# Patient Record
Sex: Female | Born: 1935 | Race: White | Hispanic: No | Marital: Single
Health system: Southern US, Community
[De-identification: ages and names within clinical notes are randomized; demographics above are authoritative.]

## PROBLEM LIST (undated history)

## (undated) DIAGNOSIS — E039 Hypothyroidism, unspecified: Secondary | ICD-10-CM

## (undated) DIAGNOSIS — M810 Age-related osteoporosis without current pathological fracture: Secondary | ICD-10-CM

## (undated) DIAGNOSIS — F0390 Unspecified dementia without behavioral disturbance: Secondary | ICD-10-CM

## (undated) DIAGNOSIS — I7090 Unspecified atherosclerosis: Secondary | ICD-10-CM

## (undated) DIAGNOSIS — I10 Essential (primary) hypertension: Secondary | ICD-10-CM

## (undated) DIAGNOSIS — K5900 Constipation, unspecified: Secondary | ICD-10-CM

## (undated) DIAGNOSIS — J449 Chronic obstructive pulmonary disease, unspecified: Secondary | ICD-10-CM

## (undated) DIAGNOSIS — E785 Hyperlipidemia, unspecified: Secondary | ICD-10-CM

## (undated) DIAGNOSIS — R32 Unspecified urinary incontinence: Secondary | ICD-10-CM

## (undated) DIAGNOSIS — L309 Dermatitis, unspecified: Secondary | ICD-10-CM

## (undated) DIAGNOSIS — N8110 Cystocele, unspecified: Secondary | ICD-10-CM

## (undated) DIAGNOSIS — E079 Disorder of thyroid, unspecified: Secondary | ICD-10-CM

## (undated) HISTORY — PX: NO PAST SURGERIES: SHX2092

## (undated) HISTORY — PX: HYSTERECTOMY: SHX81

## (undated) HISTORY — PX: JOINT REPLACEMENT: SHX530

## (undated) HISTORY — PX: EYE SURGERY: SHX253

## (undated) HISTORY — PX: KNEE SURGERY: SHX244

---

## 2016-11-21 ENCOUNTER — Observation Stay
Admission: EM | Admit: 2016-11-21 | Discharge: 2016-11-22 | Disposition: A | Discharge status: Home / Self Care | Payer: Medicare Other | Attending: Hospitalist | Admitting: Hospitalist

## 2016-11-21 ENCOUNTER — Emergency Department: Payer: Medicare Other

## 2016-11-21 DIAGNOSIS — I10 Essential (primary) hypertension: Secondary | ICD-10-CM | POA: Insufficient documentation

## 2016-11-21 DIAGNOSIS — R112 Nausea with vomiting, unspecified: Secondary | ICD-10-CM | POA: Insufficient documentation

## 2016-11-21 DIAGNOSIS — Z87898 Personal history of other specified conditions: Secondary | ICD-10-CM

## 2016-11-21 DIAGNOSIS — R42 Dizziness and giddiness: Secondary | ICD-10-CM | POA: Diagnosis present

## 2016-11-21 DIAGNOSIS — R609 Edema, unspecified: Secondary | ICD-10-CM | POA: Insufficient documentation

## 2016-11-21 DIAGNOSIS — R270 Ataxia, unspecified: Secondary | ICD-10-CM | POA: Insufficient documentation

## 2016-11-21 DIAGNOSIS — D72829 Elevated white blood cell count, unspecified: Secondary | ICD-10-CM | POA: Insufficient documentation

## 2016-11-21 DIAGNOSIS — Z66 Do not resuscitate: Secondary | ICD-10-CM | POA: Insufficient documentation

## 2016-11-21 DIAGNOSIS — R05 Cough: Secondary | ICD-10-CM | POA: Insufficient documentation

## 2016-11-21 DIAGNOSIS — Z79899 Other long term (current) drug therapy: Secondary | ICD-10-CM | POA: Insufficient documentation

## 2016-11-21 DIAGNOSIS — E039 Hypothyroidism, unspecified: Secondary | ICD-10-CM | POA: Insufficient documentation

## 2016-11-21 DIAGNOSIS — R062 Wheezing: Secondary | ICD-10-CM | POA: Insufficient documentation

## 2016-11-21 DIAGNOSIS — Z9181 History of falling: Secondary | ICD-10-CM | POA: Insufficient documentation

## 2016-11-21 DIAGNOSIS — E785 Hyperlipidemia, unspecified: Secondary | ICD-10-CM | POA: Insufficient documentation

## 2016-11-21 DIAGNOSIS — I6782 Cerebral ischemia: Secondary | ICD-10-CM | POA: Insufficient documentation

## 2016-11-21 DIAGNOSIS — H8112 Benign paroxysmal vertigo, left ear: Principal | ICD-10-CM | POA: Insufficient documentation

## 2016-11-21 HISTORY — DX: Cystocele, unspecified: N81.10

## 2016-11-21 HISTORY — DX: Hyperlipidemia, unspecified: E78.5

## 2016-11-21 HISTORY — DX: Essential (primary) hypertension: I10

## 2016-11-21 HISTORY — DX: Disorder of thyroid, unspecified: E07.9

## 2016-11-21 LAB — CBC AND DIFFERENTIAL
Absolute NRBC: 0 10*3/uL
Basophils Absolute Automated: 0.03 10*3/uL (ref 0.00–0.20)
Basophils Automated: 0.2 %
Eosinophils Absolute Automated: 0.08 10*3/uL (ref 0.00–0.70)
Eosinophils Automated: 0.5 %
Hematocrit: 40.4 % (ref 37.0–47.0)
Hgb: 13.5 g/dL (ref 12.0–16.0)
Immature Granulocytes Absolute: 0.07 10*3/uL — ABNORMAL HIGH
Immature Granulocytes: 0.5 %
Lymphocytes Absolute Automated: 1.33 10*3/uL (ref 0.50–4.40)
Lymphocytes Automated: 8.9 %
MCH: 32.1 pg — ABNORMAL HIGH (ref 28.0–32.0)
MCHC: 33.4 g/dL (ref 32.0–36.0)
MCV: 96 fL (ref 80.0–100.0)
MPV: 9.5 fL (ref 9.4–12.3)
Monocytes Absolute Automated: 0.65 10*3/uL (ref 0.00–1.20)
Monocytes: 4.3 %
Neutrophils Absolute: 12.82 10*3/uL — ABNORMAL HIGH (ref 1.80–8.10)
Neutrophils: 85.6 %
Nucleated RBC: 0 /100 WBC (ref 0.0–1.0)
Platelets: 269 10*3/uL (ref 140–400)
RBC: 4.21 10*6/uL (ref 4.20–5.40)
RDW: 13 % (ref 12–15)
WBC: 14.98 10*3/uL — ABNORMAL HIGH (ref 3.50–10.80)

## 2016-11-21 LAB — URINALYSIS
Bilirubin, UA: NEGATIVE
Blood, UA: NEGATIVE
Glucose, UA: NEGATIVE
Leukocyte Esterase, UA: NEGATIVE
Nitrite, UA: NEGATIVE
Protein, UR: NEGATIVE
Specific Gravity UA: 1.02 (ref 1.001–1.035)
Urine pH: 7 (ref 5.0–8.0)
Urobilinogen, UA: 0.2 mg/dL

## 2016-11-21 LAB — BASIC METABOLIC PANEL
Anion Gap: 11 (ref 5.0–15.0)
BUN: 21.8 mg/dL — ABNORMAL HIGH (ref 7.0–19.0)
CO2: 22 mEq/L (ref 22–29)
Calcium: 10.2 mg/dL (ref 7.9–10.2)
Chloride: 106 mEq/L (ref 100–111)
Creatinine: 0.8 mg/dL (ref 0.6–1.0)
Glucose: 140 mg/dL — ABNORMAL HIGH (ref 70–100)
Potassium: 4 mEq/L (ref 3.5–5.1)
Sodium: 139 mEq/L (ref 136–145)

## 2016-11-21 LAB — I-STAT TROPONIN: i-STAT Troponin: 0 ng/mL (ref 0.00–0.09)

## 2016-11-21 LAB — GFR: EGFR: >60

## 2016-11-21 MED ORDER — MECLIZINE HCL 25 MG PO TABS
25.0000 mg | ORAL_TABLET | Freq: Two times a day (BID) | ORAL | 0 refills | Status: DC | PRN
Start: 2016-11-21 — End: 2016-11-22

## 2016-11-21 MED ORDER — MECLIZINE HCL 12.5 MG PO TABS
12.5000 mg | ORAL_TABLET | Freq: Once | ORAL | Status: AC
Start: 2016-11-21 — End: 2016-11-21
  Administered 2016-11-21: 14:00:00 12.5 mg via ORAL
  Filled 2016-11-21: qty 1

## 2016-11-21 MED ORDER — SODIUM CHLORIDE 0.9 % IV BOLUS
500.0000 mL | Freq: Once | INTRAVENOUS | Status: AC
Start: 2016-11-21 — End: 2016-11-21
  Administered 2016-11-21: 14:00:00 500 mL via INTRAVENOUS

## 2016-11-21 MED ORDER — ONDANSETRON HCL 4 MG/2ML IJ SOLN
4.0000 mg | Freq: Once | INTRAMUSCULAR | Status: AC
Start: 2016-11-21 — End: 2016-11-21
  Administered 2016-11-21: 14:00:00 4 mg via INTRAVENOUS
  Filled 2016-11-21: qty 2

## 2016-11-21 NOTE — ED Provider Notes (Signed)
EMERGENCY DEPARTMENT HISTORY AND PHYSICAL EXAM    Date Time: 11/21/2016 1:50 PM   Patient Name: Melissa Dunn  Attending MD: Rachell Cipro  Time of Initial Evaluation:: 1:50 PM       Diagnosis and Treatment Plan     Disposition:     Observation Admit      ED Disposition     ED Disposition Condition Date/Time Comment    Observation  Sat Nov 21, 2016  4:19 PM Admitting Physician: Andrew Au [[id number]   Diagnosis: History of vertigo [682780]   Estimated Length of Stay: < 2 midnights   Tentative Discharge Plan?: Home or Self Care [1]   Patient Class: Observation [104]            Diagnosis:  1. History of vertigo    2. Non-intractable vomiting with nausea, unspecified vomiting type    3. Benign paroxysmal positional vertigo of left ear         New Discharge Prescriptions:  New Prescriptions    MECLIZINE (ANTIVERT) 25 MG TABLET    Take 1 tablet (25 mg total) by mouth 2 (two) times daily as needed for Dizziness.for up to 10 doses        History of Presenting Illness     Chief Complaint:   Chief Complaint   Patient presents with   . Dizziness   . Emesis       Melissa Dunn is a 83 y.o. female with MHx of HTN who presents with two days history of dizziness which is described as "spinning around". Movement aggravates dizziness. A/w fatigue, nausea and two episodes of emesis today. Family states that they were at [employer], patient felt dizzy and threw up. No fall/injury. Family also reports of patient wheezing. Denies fever/ chills. Patient has Advair prescribed by allergist. Patient has history of questionable stroke in the past per family. Has history of vertigo in the past. Visiting from [place].    This history was obtained from the(a) Patient and Family.      Past Medical History     Past Medical History:   Diagnosis Date   . Female bladder prolapse    . Hypertension        Past Surgical History     Past Surgical History:   Procedure Laterality Date   . EYE SURGERY     . HYSTERECTOMY     . KNEE SURGERY          Family History     History reviewed. No pertinent family history.    Social History     Social History     Social History   . Marital status: Unknown     Spouse name: N/A   . Number of children: N/A   . Years of education: N/A     Social History Main Topics   . Smoking status: Never Smoker   . Smokeless tobacco: Never Used   . Alcohol use No   . Drug use: Unknown   . Sexual activity: Not on file     Other Topics Concern   . Not on file     Social History Narrative   . No narrative on file       Allergies     No Known Allergies    Medications     No current facility-administered medications for this encounter.     Current Outpatient Prescriptions:   .  albuterol (PROVENTIL HFA;VENTOLIN HFA) 108 (90 Base) MCG/ACT inhaler, Inhale 2 puffs into the lungs.,  Disp: , Rfl:   .  alendronate (FOSAMAX) 70 MG tablet, Take 70 mg by mouth every 7 days.Take 1 tab by mouth every 7 days with a full glass of water on an empty stomach. Do not take anything else by mouth or lie down for 30 mins., Disp: , Rfl:   .  cetirizine (ZYRTEC) 10 MG tablet, Take 10 mg by mouth daily., Disp: , Rfl:   .  fluticasone-salmeterol (ADVAIR HFA) 115-21 MCG/ACT inhaler, Inhale 2 puffs into the lungs 2 (two) times daily., Disp: , Rfl:   .  levothyroxine (SYNTHROID, LEVOTHROID) 88 MCG tablet, Take 88 mcg by mouth Once a day at 6:00am., Disp: , Rfl:   .  lisinopril (PRINIVIL,ZESTRIL) 10 MG tablet, Take 10 mg by mouth daily., Disp: , Rfl:   .  potassium chloride (KLOR-CON) 20 MEQ packet, Take 20 mEq by mouth., Disp: , Rfl:   .  predniSONE (DELTASONE) 5 MG tablet, Take 5 mg by mouth daily., Disp: , Rfl:   .  simvastatin (ZOCOR) 10 MG tablet, Take 10 mg by mouth nightly., Disp: , Rfl:   .  meclizine (ANTIVERT) 25 MG tablet, Take 1 tablet (25 mg total) by mouth 2 (two) times daily as needed for Dizziness.for up to 10 doses, Disp: 10 tablet, Rfl: 0    Review of Systems     Positive: dizziness, nausea, emesis, fatigue and wheezing.    Negative: fever/  chills.    All Other Systems Reviewed and Negative: Yes.    Physical Exam     BP 179/79   Pulse 72   Temp 98 F (36.7 C)   Resp 18   Ht 5\' 3"  (1.6 m)   Wt 80.3 kg   SpO2 96%   BMI 31.35 kg/m     Constitutional: Vital signs reviewed. Well appearing. Alert. NAD. Oriented x2.  Head: Normocephalic, atraumatic  Eyes: No conjunctival injection. No discharge. PERRL. EOMI. Positive lens implants bilaterally.   ENT: Mucous membranes moist. Bl TM unremarkable.   Neck: Normal range of motion. Trachea midline.  Respiratory/Chest: Few crackles R base otherwise unremarkable.   Cardiovascular: Regular rate and rhythm. No murmur.   Abdomen: Soft and non-tender. No guarding. No masses or hepatosplenomegaly.  Extremity: Grossly moving all extremities.   Neurological: No focal motor deficits by observation. Speech normal. Memory normal. SILTx4. Four extremities 5/5 strength. Finger to nose intact bilaterally.   Skin: Warm and dry. No rash.  Psychiatric: Normal affect. Normal concentration.    Diagnostic Study Results     Labs -     Results     Procedure Component Value Units Date/Time    UA with reflex to Micro [[id number]]  (Abnormal) Collected:  11/21/16 1513    Specimen:  Urine Updated:  11/21/16 1519     Urine Type Clean Catch     Color, UA Yellow     Clarity, UA Clear     Specific Gravity UA 1.020     Urine pH 7.0     Leukocyte Esterase, UA Negative     Nitrite, UA Negative     Protein, UR Negative     Glucose, UA Negative     Ketones UA Trace (A)     Urobilinogen, UA 0.2 mg/dL      Bilirubin, UA Negative     Blood, UA Negative    i-Stat Troponin [[id number]] Collected:  11/21/16 1345     Updated:  11/21/16 1422     i-STAT Troponin 0.00 ng/mL  Basic Metabolic Panel (BMP) [[id number]]  (Abnormal) Collected:  11/21/16 1345    Specimen:  Blood Updated:  11/21/16 1405     Glucose 140 (H) mg/dL      BUN 69.6 (H) mg/dL      Creatinine 0.8 mg/dL      Calcium 29.5 mg/dL      Sodium 284 mEq/L      Potassium 4.0 mEq/L       Chloride 106 mEq/L      CO2 22 mEq/L      Anion Gap 11.0    GFR [[id number]] Collected:  11/21/16 1345     Updated:  11/21/16 1405     EGFR >60.0    CBC with Differential [[id number]]  (Abnormal) Collected:  11/21/16 1345    Specimen:  Blood from Blood Updated:  11/21/16 1348     WBC 14.98 (H) x10 3/uL      Hgb 13.5 g/dL      Hematocrit 34.7 %      Platelets 269 x10 3/uL      RBC 4.21 x10 6/uL      MCV 96.0 fL      MCH 32.1 (H) pg      MCHC 33.4 g/dL      RDW 13 %      MPV 9.5 fL      Neutrophils 85.6 %      Lymphocytes Automated 8.9 %      Monocytes 4.3 %      Eosinophils Automated 0.5 %      Basophils Automated 0.2 %      Immature Granulocyte 0.5 %      Nucleated RBC 0.0 /100 WBC      Neutrophils Absolute 12.82 (H) x10 3/uL      Abs Lymph Automated 1.33 x10 3/uL      Abs Mono Automated 0.65 x10 3/uL      Abs Eos Automated 0.08 x10 3/uL      Absolute Baso Automated 0.03 x10 3/uL      Absolute Immature Granulocyte 0.07 (H) x10 3/uL      Absolute NRBC 0.00 x10 3/uL           Radiologic Studies -   Radiology Results (24 Hour)     Procedure Component Value Units Date/Time    CT Head without Contrast [[id number]] Collected:  11/21/16 1523    Order Status:  Completed Updated:  11/21/16 1528    Narrative:       HISTORY: Vertigo and dizziness    Technique: Noncontrast CT of the head. The following dose reduction  techniques were utilized: automatic exposure control and/or adjustment  of mA and/or kV according to patient size, and the use of iterative  reconstruction technique.    Comparison: None.    Findings: There is no evidence of acute territorial infarction or  intracranial hemorrhage. The ventricles and sulci are normal in size and  configuration. There is no mass effect or midline shift. No extra-axial  collections are seen. The globes and orbits appear unremarkable. Mild  mucosal thickening is seen within the paranasal sinuses.      Impression:        No acute intracranial abnormality.    Georgann Housekeeper, MD    11/21/2016 3:24 PM    XR Chest 2 Views [[id number]] Collected:  11/21/16 1442    Order Status:  Completed Updated:  11/21/16 1448    Narrative:       HISTORY: Dizziness and cough  COMPARISON: None    FINDINGS: Heart size is normal. No focal consolidation, pleural effusion  or pneumothorax. Haziness at the left lung base likely represents  epicardial fat.           Impression:          No acute intrathoracic process.     Shelly Flatten, MD   11/21/2016 2:44 PM      .    Clinical Course in the Emergency Department     3:35 PM Offered admission however patient refused adamantly.     3:50-4:08 PM Discussed in length for options of d/c vs. hospitalization, cns service. Patient states that she is still symptomatic. Appears somewhat pale. Negative chest x-ray and CT of head. Neurologically intact. Patient did not want hospitalization but daughter thinks further work up is needed.     4:08 PM CNS hospitalist paged.     4:17 PM Spoke with Dr. Wilburt Finlay; will admit patient, neuro tele obs.       Medical Decision Making     I reviewed the vital signs, nursing notes, past medical history, past surgical history, family history and social history.    Vital Signs -   Patient Vitals for the past 12 hrs:   BP Temp Pulse Resp   11/21/16 1551 179/79 - 72 18   11/21/16 1449 - - 69 18   11/21/16 1320 179/87 98 F (36.7 C) 95 20       Pulse Oximetry Analysis - nl without need for supplemental oxygen        Laboratory results reviewed by EDP: yes in real time    EKG: interpreted by me - NSR at 84. Nl intervals. Poor R wave progression anterior leads. ST flattening in inferior leads.     Impression/ Differential Diagnosis (not completely inclusive): 83 y.o. WF presenting with acute vertigo and N/V and otherwise intact neuro exam R/O CVA, BPPV, infection  Plan:  IVFs, antivert and CT imaging  Admit CNS hospitalist for further W/U            _______________________________    Attestations:    I was acting as a scribe for Ashantee Deupree, Mcarthur Rossetti,  MD on Teodoro Spray     I am the first provider for this patient and I personally performed the services documented. Melissa Dunn is scribing for me on Mallicoat,Kadee. This note and the patient instructions accurately reflect work and decisions made by me.  Melissa Guardian, MD          _______________________________             Melissa Guardian, MD  11/25/16 [phone]

## 2016-11-22 ENCOUNTER — Observation Stay: Payer: Medicare Other

## 2016-11-22 DIAGNOSIS — R42 Dizziness and giddiness: Secondary | ICD-10-CM

## 2016-11-22 DIAGNOSIS — R270 Ataxia, unspecified: Secondary | ICD-10-CM

## 2016-11-22 DIAGNOSIS — R112 Nausea with vomiting, unspecified: Secondary | ICD-10-CM

## 2016-11-22 DIAGNOSIS — Z87898 Personal history of other specified conditions: Secondary | ICD-10-CM

## 2016-11-22 LAB — T4, FREE: T4 Free: 1.15 ng/dL (ref 0.70–1.48)

## 2016-11-22 LAB — CBC
Absolute NRBC: 0 10*3/uL
Hematocrit: 38.5 % (ref 37.0–47.0)
Hgb: 12.8 g/dL (ref 12.0–16.0)
MCH: 31.5 pg (ref 28.0–32.0)
MCHC: 33.2 g/dL (ref 32.0–36.0)
MCV: 94.8 fL (ref 80.0–100.0)
MPV: 9.8 fL (ref 9.4–12.3)
Nucleated RBC: 0 /100 WBC (ref 0.0–1.0)
Platelets: 247 10*3/uL (ref 140–400)
RBC: 4.06 10*6/uL — ABNORMAL LOW (ref 4.20–5.40)
RDW: 13 % (ref 12–15)
WBC: 10.64 10*3/uL (ref 3.50–10.80)

## 2016-11-22 LAB — PT/INR
PT INR: 1 (ref 0.9–1.1)
PT: 13.4 s (ref 12.6–15.0)

## 2016-11-22 LAB — ECG 12-LEAD
Atrial Rate: 84 {beats}/min
P Axis: 43 degrees
P-R Interval: 152 ms
Q-T Interval: 388 ms
QRS Duration: 72 ms
QTC Calculation (Bezet): 458 ms
R Axis: 27 degrees
T Axis: 50 degrees
Ventricular Rate: 84 {beats}/min

## 2016-11-22 LAB — TSH: TSH: 0.25 u[IU]/mL — ABNORMAL LOW (ref 0.35–4.94)

## 2016-11-22 MED ORDER — LORAZEPAM 2 MG/ML IJ SOLN
1.0000 mg | Freq: Once | INTRAMUSCULAR | Status: AC | PRN
Start: 2016-11-22 — End: 2016-11-22
  Administered 2016-11-22: 04:00:00 1 mg via INTRAVENOUS
  Filled 2016-11-22: qty 1

## 2016-11-22 MED ORDER — ALBUTEROL SULFATE HFA 108 (90 BASE) MCG/ACT IN AERS
2.0000 | INHALATION_SPRAY | Freq: Four times a day (QID) | RESPIRATORY_TRACT | Status: DC | PRN
Start: 2016-11-22 — End: 2016-11-22
  Filled 2016-11-22: qty 1

## 2016-11-22 MED ORDER — HYDRALAZINE HCL 20 MG/ML IJ SOLN
10.0000 mg | INTRAMUSCULAR | Status: DC | PRN
Start: 2016-11-21 — End: 2016-11-22

## 2016-11-22 MED ORDER — LISINOPRIL 10 MG PO TABS
10.0000 mg | ORAL_TABLET | Freq: Every day | ORAL | Status: DC
Start: 2016-11-22 — End: 2016-11-22
  Administered 2016-11-22: 10:00:00 10 mg via ORAL
  Filled 2016-11-22: qty 1

## 2016-11-22 MED ORDER — ASPIRIN 81 MG PO CHEW
81.0000 mg | CHEWABLE_TABLET | Freq: Every day | ORAL | Status: DC
Start: 2016-11-22 — End: 2016-11-22
  Administered 2016-11-22: 10:00:00 81 mg via ORAL
  Filled 2016-11-22: qty 1

## 2016-11-22 MED ORDER — GADOBUTROL 1 MMOL/ML IV SOLN
8.0000 mL | Freq: Once | INTRAVENOUS | Status: AC | PRN
Start: 2016-11-22 — End: 2016-11-22
  Administered 2016-11-22: 05:00:00 8 mmol via INTRAVENOUS

## 2016-11-22 MED ORDER — ONDANSETRON 4 MG PO TBDP
4.0000 mg | ORAL_TABLET | Freq: Four times a day (QID) | ORAL | Status: DC | PRN
Start: 2016-11-21 — End: 2016-11-22

## 2016-11-22 MED ORDER — LABETALOL HCL 5 MG/ML IV SOLN (WRAP)
10.0000 mg | INTRAVENOUS | Status: DC | PRN
Start: 2016-11-21 — End: 2016-11-22

## 2016-11-22 MED ORDER — ACETAMINOPHEN 650 MG RE SUPP
650.0000 mg | RECTAL | Status: DC | PRN
Start: 2016-11-21 — End: 2016-11-22

## 2016-11-22 MED ORDER — ATORVASTATIN CALCIUM 40 MG PO TABS
40.0000 mg | ORAL_TABLET | Freq: Every evening | ORAL | Status: DC
Start: 2016-11-22 — End: 2016-11-22

## 2016-11-22 MED ORDER — ENOXAPARIN SODIUM 40 MG/0.4ML SC SOLN
40.0000 mg | Freq: Every day | SUBCUTANEOUS | Status: DC
Start: 2016-11-22 — End: 2016-11-22
  Administered 2016-11-22: 10:00:00 40 mg via SUBCUTANEOUS
  Filled 2016-11-22: qty 0.4

## 2016-11-22 MED ORDER — MECLIZINE HCL 25 MG PO TABS
25.0000 mg | ORAL_TABLET | Freq: Three times a day (TID) | ORAL | 0 refills | Status: AC | PRN
Start: 2016-11-22 — End: ?

## 2016-11-22 MED ORDER — ONDANSETRON HCL 4 MG/2ML IJ SOLN
4.0000 mg | Freq: Four times a day (QID) | INTRAMUSCULAR | Status: DC | PRN
Start: 2016-11-21 — End: 2016-11-22

## 2016-11-22 MED ORDER — PREDNISONE 5 MG PO TABS
10.0000 mg | ORAL_TABLET | Freq: Every day | ORAL | Status: DC
Start: 2016-11-22 — End: 2016-11-22
  Administered 2016-11-22: 10:00:00 10 mg via ORAL
  Filled 2016-11-22: qty 2

## 2016-11-22 MED ORDER — PROMETHAZINE HCL 25 MG PO TABS
25.0000 mg | ORAL_TABLET | Freq: Four times a day (QID) | ORAL | Status: DC | PRN
Start: 2016-11-21 — End: 2016-11-22

## 2016-11-22 MED ORDER — ACETAMINOPHEN 325 MG PO TABS
650.0000 mg | ORAL_TABLET | ORAL | Status: DC | PRN
Start: 2016-11-21 — End: 2016-11-22

## 2016-11-22 MED ORDER — SODIUM CHLORIDE 0.9 % IV SOLN
INTRAVENOUS | Status: DC
Start: 2016-11-22 — End: 2016-11-22

## 2016-11-22 MED ORDER — LEVOTHYROXINE SODIUM 88 MCG PO TABS
88.0000 ug | ORAL_TABLET | Freq: Every day | ORAL | Status: DC
Start: 2016-11-22 — End: 2016-11-22
  Administered 2016-11-22: 06:00:00 88 ug via ORAL
  Filled 2016-11-22: qty 1

## 2016-11-22 MED ORDER — CETIRIZINE HCL 10 MG PO TABS
5.0000 mg | ORAL_TABLET | Freq: Every day | ORAL | Status: DC
Start: 2016-11-22 — End: 2016-11-22
  Administered 2016-11-22: 10:00:00 5 mg via ORAL
  Filled 2016-11-22: qty 1

## 2016-11-22 MED ORDER — FLUTICASONE FUROATE-VILANTEROL 100-25 MCG/INH IN AEPB
1.0000 | INHALATION_SPRAY | Freq: Every morning | RESPIRATORY_TRACT | Status: DC
Start: 2016-11-22 — End: 2016-11-22
  Administered 2016-11-22: 1 via RESPIRATORY_TRACT
  Filled 2016-11-22: qty 14

## 2016-11-22 NOTE — Plan of Care (Signed)
Problem: Moderate/High Fall Risk Score >5  Goal: Patient will remain free of falls  Outcome: Progressing   11/21/16 1959   OTHER   Moderate Risk (6-13) MOD-Apply bed exit alarm if patient is confused;MOD-Consider activation of bed alarm if appropriate;MOD-Initiate Yellow "Fall Risk" magnet communication tool;MOD-Use of assistive devices-bedside commode if appropriate       Problem: Safety  Goal: Patient will be free from injury during hospitalization  Outcome: Progressing   11/22/16 0243   Goal/Interventions addressed this shift   Patient will be free from injury during hospitalization  Assess patient's risk for falls and implement fall prevention plan of care per policy;Use appropriate transfer methods;Provide and maintain safe environment;Ensure appropriate safety devices are available at the bedside;Include patient/ family/ care giver in decisions related to safety       Problem: Every Day - Stroke  Goal: Core/Quality measure requirements - Daily  Outcome: Progressing   11/22/16 0245   Goal/Interventions addressed this shift   Core/Quality measure requirements - Daily  VTE Prevention: Ensure anticoagulant(s) administered and/or anti-embolism stockings/devices documented by end of day 2;Once lipid panel has resulted, check LDL. Contact provider for statin order if LDL > 70 (or ensure contraindication documented by LIP).;Continue stroke education (must include Modifiable Risk Factors, Warning Signs and Symptoms of Stroke, Activation of Emergency Medical System and Follow-up Appointments). Ensure handout has been given and documented.;Ensure antithrombotic administered or contraindication documented by LIP by end of day 2     Goal: Neurological status is stable or improving  Outcome: Progressing   11/22/16 0245   Goal/Interventions addressed this shift   Neurological status is stable or improving Monitor/assess/document neurological assessment (Stroke: every 4 hours);Monitor/assess NIH Stroke Scale       Comments:    .  NURSING PROGRESS NOTE: STROKE UNIT    Patient Name: Melissa Dunn 83 y.o. female)  Admission Date: 11/21/2016 [facility] Day 0)        Recent Labs  Lab 11/21/16  1345   Sodium 139   Potassium 4.0   Chloride 106   CO2 22   BUN 21.8*   Creatinine 0.8   EGFR >60.0   Glucose 140*   Calcium 10.2         Recent Labs  Lab 11/21/16  1345   WBC 14.98*   Hgb 13.5   Hematocrit 40.4   Platelets 269           Patient Lines/Drains/Airways Status    Active Lines, Drains and Airways     Name:   Placement date:   Placement time:   Site:   Days:    Peripheral IV 11/21/16 Right Antecubital  11/21/16    1621    Antecubital    less than 1                  Safety Checklist   1:1 Sitter N    Avasys N         ASSESSMENT/PLAN:    Last BM: 11/20/16    Pending Orders: MRI/MRA, PT/OT, SLP    Discharge Plan: TBD    Social/Family visits: family at bedside during admission    POC Update: family and patient    Shift Note: A/Ox4, MAE, follows commands, dizziness on admission. Dizziness resolved by 2300, but still unsteady. Generalized weakness. Telemetry monitored, sinus rhythm. Continent of both bowel and bladder, x1 assist to bathroom. Fall precautions in place, will continue to monitor.

## 2016-11-22 NOTE — Discharge Instr - AVS First Page (Addendum)
Reason for your Hospital Admission:  Vertigo      Instructions for after your discharge:  Please follow up with your Primary Care Provider after discharge

## 2016-11-22 NOTE — Progress Notes (Signed)
CM received call from pt's nurse advising that pt for d/c home today.  Pt recommended for SNF however observation status.  Pt desires d/c home with family.  Face to Face received from attending for FWW and walker provided to pt.  No barriers to d/c identified.     Maurene Capes, RN, BSN, CCM  Clinical Case Manager .

## 2016-11-22 NOTE — Plan of Care (Signed)
Patient discharged home with daughter. D/C instructions provided on follow up care and medications. Both daughter and patient verbalize understanding of instructions. IV d/c'd. Belongings sent home.

## 2016-11-22 NOTE — PT Eval Note (Signed)
[facility]   Physical Therapy Evaluation     Patient: Melissa [facility]    MRN#: [id number]   Unit: [facility] TOWER 6 EAST  Bed: F622/F622.01    Discharge Recommendations:   Discharge Recommendation: SNF       DME Recommended for Discharge:  (TBD by facility)    If SNF recommended discharge disposition is not available, patient will need hands on assist for functional mobility, front wheeled walker, bedside commode, shower chair, grab bars equipment, and HHPT.        Discharge recommendations are based on patient's progression/regression. Please see most recent note for updated discharge recommendations.    Assessment:   Melissa [facility] is a 83 y.o. female admitted 11/21/2016 with impaired gait and nausea/vomiting. At baseline patient ambulates with a quad cane and is independent with ADLs. She lives alone at ILF in [place], and is here visiting family. Patient currently requires contact guard assist/minimal assist for ambulation with a front wheeled walker secondary to unsteady gait, needs cues to maintain base of support close of front wheeled walker and cues for visual scanning to prevent running into objects while ambulating. Patient is at high risk for falls. Patient presents with decreased functional mobility, impaired gait, decreased balance, decreased activity tolerance, decreased safety awareness, and impaired coordination.    Chart review completed including review of imaging, review of labs, review of H&P and physician progress notes and review of consulting physician notes.  Pt's functional mobility is impacted by:  decreased activity tolerance, ataxia, decreased balance, decreased bed mobility, decreased coordination, gait impairment, decreased insight, decreased judgement, decreased safety awareness, motor control, decreased problem solving and decreased strength.  There are a few comorbidities or other factors that affect plan of care and require modification of task  including: assistive device needed for mobility, multiple falls, and lives alone.  Standardized tests and exams incorporated into evaluation include AMPAC mobility, balance, cognition/orientation, coordination, ROM  and Strength.  Pt demonstrates a evolving clinical presentation due to impaired balance from baseline limiting functional mobility.   Pt would continue to benefit from PT to address these deficits and increase functional independence.     Therapy Diagnosis: impaired functional mobility     Rehabilitation Potential: good    Treatment Activities: PT eval, functional mobility training, therapeutic activity, gait training, assistive device training, transfer training, patient education  Educated the patient to role of physical therapy, plan of care, goals of therapy and HEP, safety with mobility and ADLs, energy conservation techniques, pursed lip breathing, home safety.    Plan:   PT Frequency: 4-5x/wk    Treatment/Interventions: Therex, gait training, bed mobility training, transfer training, stair training, patient/family education, discharge planning, modalities of choice, balance training    Risks/benefits/POC discussed with patient        Precautions and Contraindications:   Activity: up as tolerated  Falls    Consult received for [facility] for PT Evaluation and Treatment.  Patient's medical condition is appropriate for Physical Therapy intervention at this time.    History of Present Illness:   Melissa [facility] is a 83 y.o. female admitted on 11/21/2016 with (H&P)  being off-balance and with nausea/vomitting.  I spoke with both the pt and her daughter.  Pt started having dizziness yesterday before the family took her to an event in [place].  After arriving back home at 3pm, she slept until 8am this morning.  She was dizzy again today, and at one point in [place] had an acute  episode of vomitting.  Was taken to the ER and continued having vomitting on the way.  Pt describes the dizziness as being  off-balance and with light-headedness.  Says that the light-headedness has resolved, but is still off-balance and cannot walk like she used to.    Medical Diagnosis: History of vertigo [Z87.898]  Benign paroxysmal positional vertigo of left ear [H81.12]  Non-intractable vomiting with nausea, unspecified vomiting type [R11.2]    Past Medical/Surgical History:  Past Medical History:   Diagnosis Date   . Disorder of thyroid    . Female bladder prolapse    . Hyperlipidemia    . Hypertension       Past Surgical History:   Procedure Laterality Date   . EYE SURGERY     . HYSTERECTOMY     . JOINT REPLACEMENT      Right knee implant   . KNEE SURGERY        Imaging/Tests/Labs:  Xr Chest 2 Views  Result Date: 11/21/2016   No acute intrathoracic process.    Ct Head Without Contrast  Result Date: 11/21/2016   No acute intracranial abnormality.     Mri Angiogram Head Wo Contrast  Result Date: 11/22/2016  1.  MRA of the neck is within the range of normal. 2.  The intracranial MRA is within the range of normal. 3.  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     Mri Brain With And Without Contrast  Result Date: 11/22/2016  1.  Mild leukomalacia suggesting small vessel ischemic change, age-indeterminate. 2.  No acute or recent infarct is detected. 3.  No mass or hemorrhage is detected.      Social History:   Prior Level of Function: ambulates with quad cane, independent with ADLs  Assistive Devices: quad cane  Baseline Activity: community ambulation   DME Currently at Home: quad cane, shower chair   Home Living Arrangements: alone  Type of Home: ILF in [facility]  Home Layout: 1 level apartment    Subjective:   Patient is agreeable to participation in the therapy session. Nursing clears patient for therapy.     Patient Goal: go home    Pain:   Scale: 0/10    "My speech isn't right."    Objective:   Patient received  in bed with PIV  access, telemetry, no SCD's, bed alarm on in place.    Cognitive Status and Neuro Exam:  Cognition/Neuro Status  Arousal/Alertness: Appropriate responses to stimuli  Attention Span: Appears intact  Orientation Level: Oriented to person;Oriented to place;Oriented to time  Following Commands: Follows one step commands with increased time;Follows one step commands with repetition  Safety Awareness: minimal verbal instruction  Insights: Decreased awareness of deficits  Behavior: flat affect;cooperative  Motor Planning: decreased processing speed  Coordination: FMC impaired;GMC impaired  Coordination:   Serial Opposition: decreased speed bilaterally   Finger to Nose: impaired LUE     Musculoskeletal Examination  RUE ROM: within functional limits   LUE ROM: within functional limits   RLE ROM: within functional limits   LLE ROM: within functional limits          R LE     L LE   Hip Flexion       5/5       4/5   Knee Extension       5/5  5/5   Ankle PF       4/5       4/5   Ankle DF       5/5       5/5          R UE     L UE   Shoulder Flexion        5/5        4+/5   Elbow Flexion        5/5        5/5   Elbow Extension        5/5        4+/5   Grip        5/5        5/5     Functional Mobility  Rolling: contact guard assist, increased time/effort   Supine to Sit: contact guard assist, increased time/effort   Scooting: contact guard assist scooting edge of bed   Sit to Stand: contact guard assist with front wheeled walker, cues for safety and technique   Stand to Sit: contact guard assist with front wheeled walker, cues for safety and technique   Transfers: contact guard assist/minimal assist with front wheeled walker, cues for safety and technique     Ambulation  PMP - Progressive Mobility Protocol   PMP Activity: Step 6 - Walks in Room  Distance Walked (ft) (Step 6,7): 160 Feet   Level of assistance required: contact guard assist/minimal assist   Pattern: decreased speed, decreased step length, wide base of support,  cues to maintain base of support close to front wheeled walker and cues for visual scanning to prevent running into objects while ambulating  Device Used: front wheeled walker   Weightbearing Status: no restrictions     Balance  Static Sitting: standby assist   Dynamic Sitting: contact guard assist   Static Standing: contact guard assist with front wheeled walker   Dynamic Standing: minimal assist with front wheeled walker     Participation and Activity Tolerance  Participation Effort: good  Endurance: fair+    Patient left sitting in a chair with call bell within reach, chair alarm on, fall mat in place, all needs met and all questions answered, no SCDs in place as found, RN notified of session outcome and patient response.     Goals:  Goals  Goal Formulation: With patient  Time for Goal Acheivement: 5 visits  Pt Will Go Supine To Sit: independent  Pt Will Perform Sit to Stand: modified independent  Pt Will Transfer Bed/Chair: with rolling walker, modified independent  Pt Will Ambulate: > 200 feet, with rolling walker, modified independent    Thank you for the consult.  Lorna Few, PT, DPT  [phone]     Time of Treatment  PT Received On: 11/22/16  Start Time: 0825  Stop Time: 0900  Time Calculation (min): 35 min      AM-PACT "6 Clicks" Basic Mobility Inpatient Short Form  Turning Over in Bed: None  Sitting Down On/Standing From Armchair: A little  Lying on Back to Sitting on Side of Bed: A little  Assist Moving to/from Bed to Chair: A little  Assist to Walk in Hospital Room: A little  Assist to Climb 3-5 Steps with Railing: A lot  PT Basic Mobility Raw Score: 18  CMS 0-100% Score: 46.58%    Based on [employer] AM-PACT - Basic Mobility Inpatient (Short Form) score, functional assessment, and clinical judgment:  Mobility, Current Status [phone]): CK    Mobility, Goal Status (U0454): CJ     Attention MDs:   Thank you for allowing [place] to participate in the care of [facility]. Regulations from the Center  for Medicare and Medicaid Services (CMS) require your review and approval of this plan of care.     Please cosign this note indicating you are in agreement with the PT Plan of Care so we may initiate the therapy treatment plan.

## 2016-11-22 NOTE — OT Eval Note (Addendum)
[facility]   Occupational Therapy Evaluation     Patient: Melissa [facility]    MRN#: [id number]   Unit: [facility] TOWER 6 EAST  Bed: F622/F622.01                                     Discharge Recommendations:   Discharge Recommendation: SNF   DME Recommended for Discharge:  (TBD at SNF)    If SNF recommended discharge disposition is not available, patient will need hands on assist for ADLs, functional transfers/mobility, RW, grab bars, shower chair, BSC equipment, and HHOT.       Assessment:   Melissa Dunn is a 83 y.o. female admitted 11/21/2016.   Expanded chart review completed including review of labs, review of imaging, review of vitals, calling pt's family/caregiver for thorough history and review of H&P and physician progress notes.  Pt's ability to complete ADLs and functional transfers is impaired due to the following deficits:  decreased activity tolerance, decreased balance, decreased coordination, gait impairment and transfers .  Pt demonstrates performance deficits with grooming, bathing, dressing, toileting and functional mobility. There are a few comorbidities or other factors that affect plan of care and require modification of task including: assistive device needed for mobility, frequent falls, history of vertigo/dizziness, lives alone and visiting from [place].  Pt would continue to benefit from OT to address these deficits and increase functional independence.      At baseline, pt is independent w/ ADLs however has had multiple falls in the last year d/t LOB resulting in injuries/surgeries. Pt presents w/ decreased FMC, impaired grip strength of LUE and slight weakness in LUE/LLE. Pt w/ difficulty w/ standing static balance sinkside requiring hands on assist for upright posture. Pt w/ difficulty navigating RW in room, as pt pumping into objects on L despite intact visual scanning/tracking. Pt slightly impulsive while completing ADLs.     Therapy Diagnosis: Decreased  ADL independence    Rehabilitation Potential: good for set goals    Treatment Activities: OT Evaluation, self-care, therapeutic activity    Educated the patient to role of occupational therapy, plan of care, goals of therapy and HEP, safety with mobility and ADLs, home safety.    Plan:   OT Frequency Recommended: 3-4x/wk     Treatment/Interventions: ADL training, functional transfer training, Therapeutic exercises, Therapeutic activities, Neuromuscular reeducation, Safety education    Risks/benefits/POC discussed yes w/ pt and family       Precautions and Contraindications:   Falls    Consult received for Melissa [facility] for OT Evaluation and Treatment.  Patient's medical condition is appropriate for Occupational Therapy intervention at this time.      History of Present Illness:    Melissa [facility] is a 83 y.o. female admitted on 11/21/2016 with "being off-balance and with nausea/vomitting.  I spoke with both the pt and her daughter.  Pt started having dizziness yesterday before the family took her to an event in [place].  After arriving back home at 3pm, she slept until 8am this morning.  She was dizzy again today, and at one point in [place] had an acute episode of vomitting.  Was taken to the ER and continued having vomitting on the way.  Pt describes the dizziness as being off-balance and with light-headedness.    Says that the light-headedness has resolved, but is still off-balance and cannot walk like she used to." Per H&P  Admitting Diagnosis: History of vertigo [Z87.898]  Benign paroxysmal positional vertigo of left ear [H81.12]  Non-intractable vomiting with nausea, unspecified vomiting type [R11.2]    Past Medical/Surgical History:  Past Medical History:   Diagnosis Date   . Disorder of thyroid    . Female bladder prolapse    . Hyperlipidemia    . Hypertension      Past Surgical History:   Procedure Laterality Date   . EYE SURGERY     . HYSTERECTOMY     . JOINT REPLACEMENT      Right knee implant   . KNEE  SURGERY         Imaging/Tests/Labs:  Xr Chest 2 Views  Result Date: 11/21/2016   No acute intrathoracic process.     Ct Head Without Contrast  Result Date: 11/21/2016   No acute intracranial abnormality.     Mri Angiogram Head Wo Contrast  Result Date: 11/22/2016  1.  MRA of the neck is within the range of normal. 2.  The intracranial MRA is within the range of normal. 3.  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     Mra Neck W Wo Contrast  Result Date: 11/22/2016  1.  MRA of the neck is within the range of normal. 2.  The intracranial MRA is within the range of normal. 3.  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    Mri Brain With And Without Contrast  Result Date: 11/22/2016  1.  Mild leukomalacia suggesting small vessel ischemic change, age-indeterminate. 2.  No acute or recent infarct is detected. 3.  No mass or hemorrhage is detected    Social History:   Prior Level of Function: ind w/ ADLs  Assistive Devices: SPC (occasionally)  Baseline Activity: comm ambulation  DME Currently at Home: [facility]  Home Living Arrangements: lives alone  Type of Home: Independent Living Facility in [place] [facility]  Home Layout: one level, walk in shower    Subjective: "My coordination wasn't this bad"    Patient is agreeable to participation in the therapy session. Family and/or guardian are agreeable to patient's participation in the therapy session. Nursing clears patient for therapy.     Patient Goal: get back to [place]  Pain:   Scale: denies pain    Objective:   Patient is seated in a cardiac chair with peripheral IV in place.      Cognitive Status and Neuro Exam:  A/Ox3, requiring cues for situation, able to follow simple 1-2 step directions and commands    FTN: L side impaired  RAM: L side impaired (slow)    Musculoskeletal  Examination  RUE ROM: WFL  LUE ROM: WFL  RLE ROM: WFL  LLE ROM: WFL    RUE Strength: WFL  LUE Strength: 4/5  RLE Strength: WFL  LLE Strength: 4/5      Sensory/Oculomotor Examination  Auditory: intact  Tactile: intact  Vision: appears intact, pt running into objects w/ RW on L    Activities of Daily Living  Eating: ind, set-up for lids/containers  Grooming: standing sinkside minA  Bathing: seated minA  UE Dressing: CGA for gown  LE Dressing: minA for socks  Toileting: minA    Functional Mobility:  Supine to Sit: NT, pt  up in chair  Sit to Stand: CGA w/ modVCs for safety and hand placement  Transfers: minA w/ RW    PMP Activity: Step 6 - Walks in Room      Balance  Static Sitting: good  Dynamic Sitting: good  Static Standing: minA w/ RW  Dynamic Standing: modA w/ RW    Participation and Activity Tolerance  Participation Effort: good  Endurance: good-    Patient left with call bell within reach, all needs met, SCDs off as found, fall mat not in use, chair alarm on and all questions answered. RN notified of session outcome and patient response.       Goals:  Time For Goal Achievement: 5 visits  ADL Goals  Patient will groom self: Supervision, at sinkside  Patient will dress upper body: Supervision  Patient will dress lower body: Supervision  Patient will toilet: Supervision, with AE  Mobility and Transfer Goals  Pt will perform functional transfers: Supervision, with rolling walker  Neuro Re-Ed Goals  Pt will perform dynamic standing balance: Supervision, for 5 minutes, to complete standing ADLs safely  Musculoskeletal Goals  Pt will perform fine motor coordination tasks: with supervision, feeding, grooming, fasteners, w/ home exercise program, to increase ability to complete ADLs                   AM-PACT "6 Clicks" Daily Activity Inpatient Short Form  Inpatient AM-PACT Performed?: yes  Put On/Take Off Lower Body Clothing: A little  Assist with Bathing: A little  Assist with Toileting: A little  Put On/Take Off Upper  Body Clothing: A little  Assist with Grooming: A little  Assist with Eating: None  OT Daily Activity Raw Score: 19  CMS 0-100% Score: 42.80%      Based on [facility] AM-PACT - Daily Activity Inpatient (Short Form) score, functional assessment, and clinical judgment:     Self Care, Current Status (Z6109): CK    Self Care, Goal Status (U0454): CI           Attention MDs:   Thank you for allowing [place] to participate in the care of Melissa [facility]. Regulations from the Center for Medicare and Medicaid Services (CMS) require your review and approval of this plan of care.     Please cosign this note indicating you are in agreement with the OT Plan of Care.            Silver Huguenin, OTR/L  Pager [phone]         Time of treatment:   OT Received On: 11/22/16  Start Time: 1030  Stop Time: 1115  Time Calculation (min): 45 min

## 2016-11-22 NOTE — UM Notes (Signed)
11/21/16 1619  Place (admit) on Observation Services (ADULT OBSERVATION ADMIT PANEL) Once         Adm to [facility] ACCESS [place] 11/21/2016 1311  Transfer to [place] TOWER 6 EAST 11/21/2016 1928      CC: Off-balance, vomitting      History of Presenting Illness:   Melissa [facility] is a 83 y.o. female who presents to the hospital with being off-balance and with nausea/vomitting.  I spoke with both the pt and her daughter.  Pt started having dizziness yesterday before the family took her to an event in [place].  After arriving back home at 3pm, she slept until 8am this morning.  She was dizzy again today, and at one point in [place] had an acute episode of vomitting.  Was taken to the ER and continued having vomitting on the way.  Pt describes the dizziness as being off-balance and with light-headedness.    Says that the light-headedness has resolved, but is still off-balance and cannot walk like she used to.    Past Medical History:     Past Medical History        Past Medical History:   Diagnosis Date   . Disorder of thyroid    . Female bladder prolapse    . Hyperlipidemia    . Hypertension       Hypothyroidism    Past Surgical History:     Past Surgical History   Past Surgical History:   Procedure Laterality Date   . EYE SURGERY     . HYSTERECTOMY     . JOINT REPLACEMENT      Right knee implant   . KNEE SURGERY            CT Head  No acute intracranial abnormality.  XR Chest 2 Views    No acute intrathoracic process  EKG. NSR, no ischemic changes  Assessment:   34 yro woman:    Plan:     Ataxia. Concern for Stroke.  Getting MRI Brain w/wo, MRA Head wo, and MRA Neck w/wo.  ASA 81, Tele, AM labs.  Per pt and daughter this is different than her vertigo episodes in the past    Nausea/Vomitting. 2/2 to above.  Zofran and Phenergan prn    Light-headedness.  Based on hx, does not seem like pre-syncope.  Seems to be possibly related to her ataxia and poor appetite.    Leukocytosis. UA clear, no other sx of  infection.  Recheck in AM.  Likely a stress response.    Hx Wheezing.  On home neb, but work-up as OP has thus far been negative.    FEN. Cardiac diet.  Passed swallow eval.    PPX.  No SCDs as she is a fall risk - has bed alarm.  Will do lovenox    Code Status. [name]      Vs   98 F (36.7 C) -- 95 96 % -- 20 179/87     Ed tx:  ondansetron (ZOFRAN) injection 4 mg iv, meclizine (ANTIVERT) tablet 12.5 mg po, sodium chloride 0.9 % bolus 500 mL.    Labs         11/21/2016 13:45 11/21/2016 15:13 11/22/2016 03:56   WBC 14.98 (H)  10.64   Hemoglobin 13.5  12.8   Hematocrit 40.4  38.5   Platelet Count 269  247   RBC 4.21  4.06 (L)   MCV 96.0  94.8   MCH 32.1 (H)  31.5   MCHC 33.4  33.2   RDW 13  13   MPV 9.5  9.8   Neutrophils 85.6     Lymphocytes Automated 8.9     Monocytes 4.3     Eosinophils Automated 0.5     Basophils Automated 0.2     Immature Granulocyte 0.5     Nucleated RBC 0.0  0.0   Neutro # 12.82 (H)     Abs Lymph Automated 1.33     Abs Eos Automated 0.08     Abs Mono Automated 0.65     Absolute Baso Automated 0.03     Absolute Immature Granulocyte 0.07 (H)     Absolute NRBC 0.00  0.00   Glucose 140 (H)     BUN 21.8 (H)     Creatinine 0.8     Sodium 139     Potassium 4.0     Chloride 106     Carbon Dioxide, Whole Blood 22     Calcium 10.2     Anion Gap 11.0     EGFR >60.0     TSH   0.25 (L)   i-STAT Troponin 0.00     PT   13.4   PT INR   1.0   PT Anticoag. Given Within 48 hrs.   Other: Specify   Urine Type  Clean Catch    Color, UA  Yellow    Clarity, UA  Clear    Specific Gravity, UA  1.020    Urine pH  7.0    Leukocyte Esterase, UA  Negative    Nitrite, UA  Negative    Protein, UR  Negative    Glucose, UA  Negative    Ketones UA  Trace (A)    Urobilinogen, UA  0.2    Bilirubin, UA  Negative    Blood, UA  Negative            11/22/16    Vs    F (35.8 C) Oral 76 96 % -- 19 140/68       MRI Angiogram Head , MRA Neck   1.  MRA of the neck is within the range of normal.  2.  The intracranial MRA is within the  range of normal.  3.  Incidental note is made of both a congenitally absent A1 segment of  the right ACA and a congenitally small caliber of the P1 segment of the  right PCA.  MRI brain   1.  Mild leukomalacia suggesting small vessel ischemic change,  age-indeterminate.  2.  No acute or recent infarct is detected.  3.  No mass or hemorrhage is detected.      labetalol 10 mg iv Every 15 min PRN for High Blood Pressure    PT EVALUATE AND TREAT   Ondansetron 4 mg iv Every 6 hours PRN   Enoxaparin 40 mg sq daily  hydrALAZINE 10 mg iv Every 3 hours PRN  albuterol 2 puff ihn Every 6 hours as needed   Diet cardiac   0.9% NaCl infusion Continuous @ 75 mL/hr

## 2016-11-22 NOTE — Discharge Summary (Signed)
[facility] DISCHARGE SUMMARY    Date Time: 11/22/16 12:19 PM  Patient Name: Melissa Dunn  Attending Physician: Les Pou, MDMD    Date of Admission:   11/21/2016    Date of Discharge:   11/22/2016    Reason for Admission:   History of vertigo [Z87.898]  Benign paroxysmal positional vertigo of left ear [H81.12]  Non-intractable vomiting with nausea, unspecified vomiting type [R11.2]    Problems:   Lists the present on admission hospital problems  Present on Admission:  . Vertigo      Problem Lists:  Patient Active Problem List   Diagnosis   . History of vertigo   . Vertigo       Discharge Dx:   History of vertigo [Z87.898]  Benign paroxysmal positional vertigo of left ear [H81.12]  Non-intractable vomiting with nausea, unspecified vomiting type [R11.2]    Consultations:   Treatment Team: Attending Provider: Les Pou, MD; Respiratory Care Practitioner: Bile, Quinn Axe, RT; Registered Nurse: Erasmo Score, RN; Physical Therapist: Warden Fillers, PT    Procedures performed:     MRI Angiogram Head WO Contrast   Final Result      1.  MRA of the neck is within the range of normal.   2.  The intracranial MRA is within the range of normal.   3.  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      Theodoro Doing, MD    11/22/2016 9:03 AM      MRA Neck W WO Contrast   Final Result      1.  MRA of the neck is within the range of normal.   2.  The intracranial MRA is within the range of normal.   3.  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      Theodoro Doing, MD    11/22/2016 9:03 AM      MRI brain with and without contrast   Final Result      1.  Mild leukomalacia suggesting small vessel ischemic change,   age-indeterminate.   2.  No acute or recent infarct is detected.   3.  No  mass or hemorrhage is detected.      Theodoro Doing, MD    11/22/2016 8:55 AM      CT Head without Contrast   Final Result    No acute intracranial abnormality.      Georgann Housekeeper, MD    11/21/2016 3:24 PM      XR Chest 2 Views   Final Result       No acute intrathoracic process.       Shelly Flatten, MD    11/21/2016 2:44 PM          Presenting history and hospital Course:   HPI per admitting provider  "Melissa Dunn is a 83 y.o. female who presents to the hospital with being off-balance and with nausea/vomitting.  I spoke with both the pt and her daughter.  Pt started having dizziness yesterday before the family took her to an event in [place].  After arriving back home at 3pm, she slept until 8am this morning.  She was dizzy again today, and  at one point in [place] had an acute episode of vomitting.  Was taken to the ER and continued having vomitting on the way.  Pt describes the dizziness as being off-balance and with light-headedness.    Says that the light-headedness has resolved, but is still off-balance and cannot walk like she used to.  "      HOSPITAL COURSE    #Neuro: Patient presented with complaints of vertigo and dizziness.  She underwent MRI brain which was negative for acute pathology. She reported that symptoms had completely resolved on day of discharge, she denies Nausea/Vomiting and will f/u with her Neurologist on discharge.  She will discharge with PO meclizine for veritigo.      Disposition: HOME    Physical exam at discharge:  Vitals:    11/22/16 1143   BP: 141/84   Pulse:    Resp:    Temp:    SpO2:      General: awake, alert, oriented x 3; no acute distress.  HEENT: NC/AT  Neck: supple,  Cardiovascular: regular rate and rhythm, no murmurs, rubs or gallops  Lungs: clear to auscultation bilaterally, without wheezing, rhonchi, or rales  Abdomen: soft, non-tender, non-distended;  normoactive bowel sounds, no rebound or guarding  Extremities: no clubbing, cyanosis, or edema  Neuro: cranial  nerves grossly intact, strength 5/5 in upper and lower extremities, sensation intact  Skin: no rashes or lesions noted    Discharge Medications:        Medication List      START taking these medications    meclizine 25 MG tablet  Commonly known as:  ANTIVERT  Take 1 tablet (25 mg total) by mouth 3 (three) times daily as needed for Dizziness.for up to 10 doses        CONTINUE taking these medications    albuterol 108 (90 Base) MCG/ACT inhaler  Commonly known as:  PROVENTIL HFA;VENTOLIN HFA     alendronate 70 MG tablet  Commonly known as:  FOSAMAX     cetirizine 10 MG tablet  Commonly known as:  ZyrTEC     fluticasone-salmeterol 115-21 MCG/ACT inhaler  Commonly known as:  ADVAIR HFA     levothyroxine 88 MCG tablet  Commonly known as:  SYNTHROID, LEVOTHROID     lisinopril 10 MG tablet  Commonly known as:  PRINIVIL,ZESTRIL     potassium chloride 20 MEQ packet  Commonly known as:  KLOR-CON     predniSONE 5 MG tablet  Commonly known as:  DELTASONE     simvastatin 10 MG tablet  Commonly known as:  ZOCOR           Where to Get Your Medications      You can get these medications from any pharmacy    Bring a paper prescription for each of these medications   meclizine 25 MG tablet          Discharge Instructions:   Rhona Leavens, MD  [address]  [place] [address]  [phone]    Follow up          TIME SPENT:   On discharge and care coordination is 45 minutes.    Signed by: Les Pou, MD    CC to: No primary care provider on file.

## 2016-11-22 NOTE — H&P (Signed)
[facility] ADMISSION HISTORY AND PHYSICAL EXAM    Date Time: 11/22/16 12:21 AM  Patient Name: Melissa Dunn  Attending Physician: Clemmie Krill Antonieta Pert, MD  Primary Care Physician: No primary care provider on file.    CC: Off-balance, vomitting      History of Presenting Illness:   Melissa Dunn is a 83 y.o. female who presents to the hospital with being off-balance and with nausea/vomitting.  I spoke with both the pt and her daughter.  Pt started having dizziness yesterday before the family took her to an event in [place].  After arriving back home at 3pm, she slept until 8am this morning.  She was dizzy again today, and at one point in [place] had an acute episode of vomitting.  Was taken to the ER and continued having vomitting on the way.  Pt describes the dizziness as being off-balance and with light-headedness.    Says that the light-headedness has resolved, but is still off-balance and cannot walk like she used to.    Past Medical History:     Past Medical History:   Diagnosis Date   . Disorder of thyroid    . Female bladder prolapse    . Hyperlipidemia    . Hypertension    Hypothyroidism    Past Surgical History:     Past Surgical History:   Procedure Laterality Date   . EYE SURGERY     . HYSTERECTOMY     . JOINT REPLACEMENT      Right knee implant   . KNEE SURGERY         Family History:   History reviewed. No pertinent family history.    Social History:     No smoking hx  Occ etoh  Denies rec drug use    Allergies:   No Known Allergies    Medications:     Prescriptions Prior to Admission   Medication Sig   . albuterol (PROVENTIL HFA;VENTOLIN HFA) 108 (90 Base) MCG/ACT inhaler Inhale 2 puffs into the lungs.   Marland Kitchen alendronate (FOSAMAX) 70 MG tablet Take 70 mg by mouth every 7 days.Take 1 tab by mouth every 7 days with a full glass of water on an empty stomach. Do not take anything else by mouth or lie down for 30 mins.   . cetirizine (ZYRTEC) 10 MG tablet Take 10 mg by mouth daily.   .  fluticasone-salmeterol (ADVAIR HFA) 115-21 MCG/ACT inhaler Inhale 2 puffs into the lungs 2 (two) times daily.   Marland Kitchen levothyroxine (SYNTHROID, LEVOTHROID) 88 MCG tablet Take 88 mcg by mouth Once a day at 6:00am.   . lisinopril (PRINIVIL,ZESTRIL) 10 MG tablet Take 10 mg by mouth daily.   . potassium chloride (KLOR-CON) 20 MEQ packet Take 20 mEq by mouth.   . predniSONE (DELTASONE) 5 MG tablet Take 10 mg by mouth daily.       . simvastatin (ZOCOR) 10 MG tablet Take 10 mg by mouth nightly.       Current Facility-Administered Medications   Medication Dose Route Frequency   . aspirin  81 mg Oral Daily   . atorvastatin  40 mg Oral QHS   . cetirizine  5 mg Oral Daily   . enoxaparin  40 mg Subcutaneous Daily   . fluticasone furoate-vilanterol  1 puff Inhalation QAM   . levothyroxine  88 mcg Oral Daily at 0600   . lisinopril  10 mg Oral Daily   . predniSONE  10 mg Oral Daily  Review of Systems:   Vomitting, nausea, off-balance.  All other systems were reviewed and are negative     Physical Exam:     Vitals:    11/21/16 2311   BP: 172/70   Pulse: 81   Resp: 18   Temp: 97.2 F (36.2 C)   SpO2: 94%       Intake and Output Summary (Last 24 hours) at Date Time    Intake/Output Summary (Last 24 hours) at 11/22/16 0021  Last data filed at 11/21/16 1551   Gross per 24 hour   Intake              500 ml   Output                0 ml   Net              500 ml       General: awake, alert, oriented, no acute distress.  HEENT: perrla, eomi, sclera anicteric  oropharynx clear   Neck: no lymphadenopathy  Cardiovascular: regular rate and rhythm, no murmurs, rubs or gallops  Lungs: clear to auscultation bilaterally, without wheezing, rhonchi, or rales  Abdomen: soft, non-tender, non-distended  Extremities: edema  Neuro: cranial nerves grossly intact, strength 5/5 in upper and lower extremities, gross touch symmetrical and intact,  Unsteady on feet.   Skin: no rashes or lesions noted        Labs:     Results     Procedure Component Value  Units Date/Time    UA with reflex to Micro [[id number]]  (Abnormal) Collected:  11/21/16 1513    Specimen:  Urine Updated:  11/21/16 1519     Urine Type Clean Catch     Color, UA Yellow     Clarity, UA Clear     Specific Gravity UA 1.020     Urine pH 7.0     Leukocyte Esterase, UA Negative     Nitrite, UA Negative     Protein, UR Negative     Glucose, UA Negative     Ketones UA Trace (A)     Urobilinogen, UA 0.2 mg/dL      Bilirubin, UA Negative     Blood, UA Negative    i-Stat Troponin [[id number]] Collected:  11/21/16 1345     Updated:  11/21/16 1422     i-STAT Troponin 0.00 ng/mL     Basic Metabolic Panel (BMP) [[id number]]  (Abnormal) Collected:  11/21/16 1345    Specimen:  Blood Updated:  11/21/16 1405     Glucose 140 (H) mg/dL      BUN 40.3 (H) mg/dL      Creatinine 0.8 mg/dL      Calcium 47.4 mg/dL      Sodium 259 mEq/L      Potassium 4.0 mEq/L      Chloride 106 mEq/L      CO2 22 mEq/L      Anion Gap 11.0    GFR [[id number]] Collected:  11/21/16 1345     Updated:  11/21/16 1405     EGFR >60.0    CBC with Differential [[id number]]  (Abnormal) Collected:  11/21/16 1345    Specimen:  Blood from Blood Updated:  11/21/16 1348     WBC 14.98 (H) x10 3/uL      Hgb 13.5 g/dL      Hematocrit 66.0 %      Platelets 269 x10 3/uL      RBC 4.21 x10 6/uL  MCV 96.0 fL      MCH 32.1 (H) pg      MCHC 33.4 g/dL      RDW 13 %      MPV 9.5 fL      Neutrophils 85.6 %      Lymphocytes Automated 8.9 %      Monocytes 4.3 %      Eosinophils Automated 0.5 %      Basophils Automated 0.2 %      Immature Granulocyte 0.5 %      Nucleated RBC 0.0 /100 WBC      Neutrophils Absolute 12.82 (H) x10 3/uL      Abs Lymph Automated 1.33 x10 3/uL      Abs Mono Automated 0.65 x10 3/uL      Abs Eos Automated 0.08 x10 3/uL      Absolute Baso Automated 0.03 x10 3/uL      Absolute Immature Granulocyte 0.07 (H) x10 3/uL      Absolute NRBC 0.00 x10 3/uL           Radiology Results (24 Hour)     Procedure Component Value Units Date/Time    CT Head without  Contrast [[id number]] Collected:  11/21/16 1523    Order Status:  Completed Updated:  11/21/16 1528    Narrative:       HISTORY: Vertigo and dizziness    Technique: Noncontrast CT of the head. The following dose reduction  techniques were utilized: automatic exposure control and/or adjustment  of mA and/or kV according to patient size, and the use of iterative  reconstruction technique.    Comparison: None.    Findings: There is no evidence of acute territorial infarction or  intracranial hemorrhage. The ventricles and sulci are normal in size and  configuration. There is no mass effect or midline shift. No extra-axial  collections are seen. The globes and orbits appear unremarkable. Mild  mucosal thickening is seen within the paranasal sinuses.      Impression:        No acute intracranial abnormality.    Georgann Housekeeper, MD   11/21/2016 3:24 PM    XR Chest 2 Views [[id number]] Collected:  11/21/16 1442    Order Status:  Completed Updated:  11/21/16 1448    Narrative:       HISTORY: Dizziness and cough    COMPARISON: None    FINDINGS: Heart size is normal. No focal consolidation, pleural effusion  or pneumothorax. Haziness at the left lung base likely represents  epicardial fat.           Impression:          No acute intrathoracic process.     Shelly Flatten, MD   11/21/2016 2:44 PM        EKG. NSR, no ischemic changes    Assessment:   30 yro woman:    Plan:     Ataxia. Concern for Stroke.  Getting MRI Brain w/wo, MRA Head wo, and MRA Neck w/wo.  ASA 81, Tele, AM labs.  Per pt and daughter this is different than her vertigo episodes in the past    Nausea/Vomitting. 2/2 to above.  Zofran and Phenergan prn    Light-headedness.  Based on hx, does not seem like pre-syncope.  Seems to be possibly related to her ataxia and poor appetite.    Leukocytosis. UA clear, no other sx of infection.  Recheck in AM.  Likely a stress response.    Hx Wheezing.  On home neb, but work-up as OP has thus far been negative.    FEN. Cardiac  diet.  Passed swallow eval.    PPX.  No SCDs as she is a fall risk - has bed alarm.  Will do lovenox    Code Status. [name]      Signed by: Fransisca Connors, MD   cc:No primary care provider on file.

## 2016-11-22 NOTE — Discharge Instructions (Signed)
Dizziness, Nonspecific    You have been seen for dizziness.     Dizziness can mean different things to different people. Some people use dizziness to mean the feeling of spinning when there is no actual movement. This often causes nausea (feeling sick). The medical term for this is "vertigo." Others people use the word dizzy to mean "feeling lightheaded," like you might faint. This feeling is usually made better when lying down. For some people, neither of these describes how they are feeling. It can just be a feeling that makes you unsteady. This feeling is common in older people. It can be caused by a number of things. These include poor vision or hearing, foot problems and arthritis. It can also be caused by middle ear or sinus problems. The feeling can come and go.    Dizziness is also caused by more serious things. This includes strokes and heart problems.    It is NEVER normal to have the kind of dizziness you have today together with:   Chest pain.   Problems walking because of problems with balance. Especially if you are falling to one side.   Weakness, numbness or tingling in a part of your body.   Drooping of one side of your face.   Confusion.   Severe headache.   Problems speaking.    If you have these symptoms, it is VERY IMPORTANT to go to the nearest emergency department.    Your tests today were negative (normal). This means we found no life-threatening causes for your dizziness. It is OK for you to go home.    See your primary care doctor for more work-up of your dizziness.     YOU SHOULD SEEK MEDICAL ATTENTION IMMEDIATELY, EITHER HERE OR AT THE NEAREST EMERGENCY DEPARTMENT, IF ANY OF THE FOLLOWING OCCUR:   You cannot speak clearly (slurring), one side of your face droops or you feel weak in the arms or legs (especially on one side).   You have problems with your balance.   You have problems hearing or there is ringing or a feeling of fullness in your ear.   You lose consciousness  ("pass out" or faint).   You have severe headache with dizziness.   You have fever (temperature higher than 100.4F / 38C).   You fall and hit your head.             Vertigo    You have been diagnosed with vertigo.    Vertigo means "the feeling of spinning." People with vertigo have an intense feeling that the room is spinning. This is often called "dizziness."    Most of the time the cause of vertigo is not serious. The most common cause is a balance problem in the inner ear.    The usual treatment is medication to help relieve the spinning feeling and to control nausea.    Your type of vertigo is called benign positional vertigo. This type of vertigo results from crystals (called otoliths) in the canals in the inner ear. The canals are fluid-filled tubes that help with balance. When the crystals move within the canals, it interferes with nerve signals and sends wrong information about balance. This causes nystagmus (abnormal eye movement) and the feeling of spinning.   This type of vertigo can last up to 6 weeks but usually improves sooner. Medication can help. Your vertigo is not dangerous but it can be inconvenient.    DO NOT drive a motor vehicle or operate any other   equipment that requires concentration until your symptoms have resolved. Be very careful going up and down stairs.    If your symptoms continue your doctor may order an MRI of your brain to make sure there is not a more serious cause of your vertigo.    YOU SHOULD SEEK MEDICAL ATTENTION IMMEDIATELY, EITHER HERE OR AT THE NEAREST EMERGENCY DEPARTMENT, IF ANY OF THE FOLLOWING OCCURS:   You feel numbness, tingling, or weakness in your arms or legs or become unable to walk.   Your symptoms become worse, even with medication.   You have a severe headache.   You have vomiting that makes it hard to take or keep down medication.

## 2016-11-25 DIAGNOSIS — R112 Nausea with vomiting, unspecified: Secondary | ICD-10-CM

## 2016-11-25 DIAGNOSIS — R270 Ataxia, unspecified: Secondary | ICD-10-CM

## 2019-10-26 ENCOUNTER — Encounter (HOSPITAL_COMMUNITY): Payer: Self-pay

## 2019-10-26 ENCOUNTER — Other Ambulatory Visit: Payer: Self-pay

## 2019-10-26 ENCOUNTER — Emergency Department (HOSPITAL_COMMUNITY)
Admission: EM | Admit: 2019-10-26 | Discharge: 2019-10-26 | Disposition: A | Discharge status: Home / Self Care | Payer: Medicare Other | Attending: Emergency Medicine | Admitting: Emergency Medicine

## 2019-10-26 ENCOUNTER — Emergency Department (HOSPITAL_COMMUNITY): Payer: Medicare Other

## 2019-10-26 DIAGNOSIS — Z7901 Long term (current) use of anticoagulants: Secondary | ICD-10-CM | POA: Insufficient documentation

## 2019-10-26 DIAGNOSIS — J449 Chronic obstructive pulmonary disease, unspecified: Secondary | ICD-10-CM | POA: Diagnosis not present

## 2019-10-26 DIAGNOSIS — S0990XA Unspecified injury of head, initial encounter: Secondary | ICD-10-CM | POA: Diagnosis present

## 2019-10-26 DIAGNOSIS — I10 Essential (primary) hypertension: Secondary | ICD-10-CM | POA: Insufficient documentation

## 2019-10-26 DIAGNOSIS — Z79899 Other long term (current) drug therapy: Secondary | ICD-10-CM | POA: Diagnosis not present

## 2019-10-26 DIAGNOSIS — W19XXXA Unspecified fall, initial encounter: Secondary | ICD-10-CM

## 2019-10-26 HISTORY — DX: Dermatitis, unspecified: L30.9

## 2019-10-26 HISTORY — DX: Unspecified urinary incontinence: R32

## 2019-10-26 HISTORY — DX: Essential (primary) hypertension: I10

## 2019-10-26 HISTORY — DX: Age-related osteoporosis without current pathological fracture: M81.0

## 2019-10-26 HISTORY — DX: Unspecified dementia, unspecified severity, without behavioral disturbance, psychotic disturbance, mood disturbance, and anxiety: F03.90

## 2019-10-26 HISTORY — DX: Chronic obstructive pulmonary disease, unspecified: J44.9

## 2019-10-26 HISTORY — DX: Hyperlipidemia, unspecified: E78.5

## 2019-10-26 HISTORY — DX: Constipation, unspecified: K59.00

## 2019-10-26 LAB — CBC WITH DIFFERENTIAL/PLATELET
Abs Immature Granulocytes: 0.03 10*3/uL (ref 0.00–0.07)
Basophils Absolute: 0.1 10*3/uL (ref 0.0–0.1)
Basophils Relative: 1 %
Eosinophils Absolute: 0 10*3/uL (ref 0.0–0.5)
Eosinophils Relative: 1 %
HCT: 43 % (ref 36.0–46.0)
Hemoglobin: 13.7 g/dL (ref 12.0–15.0)
Immature Granulocytes: 0 %
Lymphocytes Relative: 15 %
Lymphs Abs: 1.2 10*3/uL (ref 0.7–4.0)
MCH: 31.5 pg (ref 26.0–34.0)
MCHC: 31.9 g/dL (ref 30.0–36.0)
MCV: 98.9 fL (ref 80.0–100.0)
Monocytes Absolute: 0.4 10*3/uL (ref 0.1–1.0)
Monocytes Relative: 5 %
Neutro Abs: 6 10*3/uL (ref 1.7–7.7)
Neutrophils Relative %: 78 %
Platelets: 267 10*3/uL (ref 150–400)
RBC: 4.35 MIL/uL (ref 3.87–5.11)
RDW: 12.5 % (ref 11.5–15.5)
WBC: 7.8 10*3/uL (ref 4.0–10.5)
nRBC: 0 % (ref 0.0–0.2)

## 2019-10-26 LAB — COMPREHENSIVE METABOLIC PANEL
ALT: 13 U/L (ref 0–44)
AST: 16 U/L (ref 15–41)
Albumin: 3.5 g/dL (ref 3.5–5.0)
Alkaline Phosphatase: 48 U/L (ref 38–126)
Anion gap: 10 (ref 5–15)
BUN: 20 mg/dL (ref 8–23)
CO2: 24 mmol/L (ref 22–32)
Calcium: 9.9 mg/dL (ref 8.9–10.3)
Chloride: 106 mmol/L (ref 98–111)
Creatinine, Ser: 0.73 mg/dL (ref 0.44–1.00)
GFR calc Af Amer: >60 mL/min (ref >60)
GFR calc non Af Amer: >60 mL/min (ref >60)
Glucose, Bld: 126 mg/dL — ABNORMAL HIGH (ref 70–99)
Potassium: 3.9 mmol/L (ref 3.5–5.1)
Sodium: 140 mmol/L (ref 135–145)
Total Bilirubin: 0.7 mg/dL (ref 0.3–1.2)
Total Protein: 6.4 g/dL — ABNORMAL LOW (ref 6.5–8.1)

## 2019-10-26 MED ORDER — PREDNISONE 20 MG PO TABS
10.0000 mg | ORAL_TABLET | Freq: Once | ORAL | Status: AC
  Administered 2019-10-26: 10 mg via ORAL
  Filled 2019-10-26: qty 1

## 2019-10-26 NOTE — ED Notes (Signed)
Help get patient undressed on the monitor did ekg shown to Dr Fredderick Phenix

## 2019-10-26 NOTE — ED Notes (Signed)
Patient transported to CT 

## 2019-10-26 NOTE — ED Notes (Signed)
Ptar called by Clifford Benninger pt is no. 3 on the list

## 2019-10-26 NOTE — ED Notes (Signed)
Spoke with [facility] RN and gave report

## 2019-10-26 NOTE — ED Notes (Signed)
Patient and patients family verbalizes understanding of discharge instructions. Opportunity for questioning and answers were provided. Pt leaving with [facility] back to [facility].

## 2019-10-26 NOTE — ED Provider Notes (Signed)
[facility] Provider Note   CSN: [id number] Arrival date & time: 10/26/19  1155     History Chief Complaint  Patient presents with  . Level 2 Trauma  . Fall    Jaclyn Weaver is a 86 y.o. female.  Patient is a 86 year old female with a history of dementia who presents after a fall.  She has advanced dementia and is noncommunicative.  She is also nonambulatory and wheelchair-bound.  She was sitting in her wheelchair and was found later to be on the floor in front of her wheelchair.  Her daughter says that [place] recently [place] to [place] and at [place] prior facility [place], she had a Velcro strap to help remind her to stay in the wheelchair.  She frequently tries to lean forward and get herself up from the wheelchair.  She apparently hit her head because she is got a hematoma to her forehead.  She is at her baseline mental status per family and staff.  She has not had any known recent illnesses.  She has a rash to her face which is chronic per the daughter.  History is limited due to her dementia.        Past Medical History:  Diagnosis Date  . Constipation    from facility paperwork  . COPD (chronic obstructive pulmonary disease) (HCC)   . Dementia (HCC)    from facility paperwork  . Dermatitis    from facility paperwork  . Hyperlipidemia    from facility paperwork  . Hypertension   . Osteoporosis    from facility paperwork  . Urinary incontinence    from facility paperwork    There are no problems to display for this patient.    The histories are not reviewed yet. Please review them in the "History" navigator section and refresh this SmartLink.   OB History   No obstetric history on file.     No family history on file.  Social History   Tobacco Use  . Smoking status: Not on file  Substance Use Topics  . Alcohol use: Not on file  . Drug use: Not on file    Home Medications Prior to Admission medications     Medication Sig Start Date End Date Taking? Authorizing Provider  acetaminophen (TYLENOL) 325 MG tablet Take 650 mg by mouth every 4 (four) hours as needed for mild pain or fever.   Yes [provider]  alendronate-cholecalciferol (FOSAMAX PLUS D) 70-2800 MG-UNIT tablet Take 1 tablet by mouth every 7 (seven) days. Take with a full glass of water on an empty stomach.   Yes [provider]  apixaban (ELIQUIS) 5 MG TABS tablet Take 5 mg by mouth 2 (two) times daily.   Yes [provider]  bisacodyl (DULCOLAX) 10 MG suppository Place 10 mg rectally daily as needed for moderate constipation.   Yes [provider]  Calcium Carbonate-Vitamin D3 (CALCIUM 600-D) 600-400 MG-UNIT TABS Take 1 tablet by mouth daily.   Yes [provider]  Cranberry 450 MG TABS Take 450 tablets by mouth daily.   Yes [provider]  doxycycline (ADOXA) 100 MG tablet Take 100 mg by mouth 2 (two) times daily. 10/20/19  Yes [provider]  fluticasone-salmeterol (ADVAIR HFA) 45-21 MCG/ACT inhaler Inhale 1 puff into the lungs 2 (two) times daily.   Yes [provider]  ipratropium (ATROVENT) 0.06 % nasal spray Place 2 sprays into both nostrils in the morning and at bedtime.  Yes [provider]  levothyroxine (SYNTHROID) 75 MCG tablet Take 75 mcg by mouth daily before breakfast.   Yes [provider]  magnesium hydroxide (MILK OF MAGNESIA) 400 MG/5ML suspension Take 30 mLs by mouth daily as needed for mild constipation (if no BM in 3 days).   Yes [provider]  Multiple Vitamins-Minerals (MULTIVITAMIN WITH MINERALS) tablet Take 1 tablet by mouth daily.   Yes [provider]  predniSONE (DELTASONE) 5 MG tablet Take 5 mg by mouth daily with breakfast.   Yes [provider]  senna (SENOKOT) 8.6 MG TABS tablet Take 1 tablet by mouth daily.   Yes [provider]  simvastatin (ZOCOR) 10 MG tablet Take 10 mg by  mouth every other day.   Yes [provider]  tamsulosin (FLOMAX) 0.4 MG CAPS capsule Take 0.4 mg by mouth at bedtime.   Yes [provider]    Allergies    Penicillins  Review of Systems   Review of Systems  Unable to perform ROS: Dementia    Physical Exam Updated Vital Signs BP (!) 163/83   Pulse 85   Temp 98.5 F (36.9 C) (Oral)   Resp 19   SpO2 97%   Physical Exam Constitutional:      Appearance: She is well-developed.  HENT:     Head: Normocephalic.     Comments: Hematoma to her right forehead    Mouth/Throat:     Comments: Raised erythematous rash to her face (chronic per daughter) Eyes:     Pupils: Pupils are equal, round, and reactive to light.  Neck:     Comments: C-collar in place, no obvious tenderness on palpation of the cervical, thoracic or lumbosacral spine Cardiovascular:     Rate and Rhythm: Normal rate and regular rhythm.     Heart sounds: Normal heart sounds.  Pulmonary:     Effort: Pulmonary effort is normal. No respiratory distress.     Breath sounds: Normal breath sounds. No wheezing or rales.  Chest:     Chest wall: No tenderness.  Abdominal:     General: Bowel sounds are normal.     Palpations: Abdomen is soft.     Tenderness: There is no abdominal tenderness. There is no guarding or rebound.  Musculoskeletal:        General: Normal range of motion.     Comments: No discomfort on palpation or range of motion of the extremities  Lymphadenopathy:     Cervical: No cervical adenopathy.  Skin:    General: Skin is warm and dry.     Findings: No rash.  Neurological:     Mental Status: She is alert.     Comments: Patient is awake and will see her name with difficulty.  Otherwise is noncommunicative.  She is moving all extremities symmetrically without obvious focal deficits     ED Results / Procedures / Treatments   Labs (all labs ordered are listed, but only abnormal results are displayed) Labs Reviewed  COMPREHENSIVE  METABOLIC PANEL - Abnormal; Notable for the following components:      Result Value   Glucose, Bld 126 (*)    Total Protein 6.4 (*)    All other components within normal limits  CBC WITH DIFFERENTIAL/PLATELET    EKG EKG Interpretation  Date/Time:  Thursday October 26 2019 12:09:06 EDT Ventricular Rate:  86 PR Interval:    QRS Duration: 97 QT Interval:  370 QTC Calculation: 443 R Axis:   44 Text Interpretation:  Sinus rhythm No old tracing to compare Confirmed by Rolan Bucco [phone]) on 10/26/2019 12:23:13 PM   Radiology CT Head Wo Contrast  Result Date: 10/26/2019 CLINICAL DATA:  Head trauma.  Fall.  Neck pain.  Confusion. EXAM: CT HEAD WITHOUT CONTRAST CT CERVICAL SPINE WITHOUT CONTRAST TECHNIQUE: Multidetector CT imaging of the head and cervical spine was performed following the standard protocol without intravenous contrast. Multiplanar CT image reconstructions of the cervical spine were also generated. COMPARISON:  None. FINDINGS: CT HEAD FINDINGS Brain: No evidence of acute infarction, hemorrhage, hydrocephalus, extra-axial collection or mass lesion/mass effect. Patchy white matter hypoattenuation, compatible with chronic microvascular ischemic disease. Mild generalized cerebral atrophy with ex vacuo ventricular dilation Vascular: Calcific atherosclerosis. Skull: Right frontal scalp contusion without underlying calvarial fracture. Sinuses/Orbits: No substantial paranasal sinus disease. No acute findings. Other: No mastoid effusions. CT CERVICAL SPINE FINDINGS Alignment: Mild (a few mm) of anterolisthesis of C4 on C5, favor degenerative given severe facet arthropathy is level. Otherwise, no substantial subluxation. Skull base and vertebrae: No acute fracture. Vertebral body heights are maintained. No primary bone lesion or focal pathologic process. Osteopenia. Soft tissues and spinal canal: No large spinal canal hematoma. Disc levels: Bulky multilevel facet hypertrophy with at least  moderate foraminal stenosis at multiple levels. Bulky left a centric disc osteophyte and uncovertebral hypertrophy at C6-C7. Fusion across the right-sided C3-C4 facet joint. Upper chest: Negative. Other: None. IMPRESSION: 1. No evidence of acute intracranial abnormality. 2. Multilevel bulky facet arthropathy with at least moderate foraminal stenosis at multiple levels. Electronically Signed   By: Feliberto Harts MD   On: 10/26/2019 13:13   CT Cervical Spine Wo Contrast  Result Date: 10/26/2019 CLINICAL DATA:  Head trauma.  Fall.  Neck pain.  Confusion. EXAM: CT HEAD WITHOUT CONTRAST CT CERVICAL SPINE WITHOUT CONTRAST TECHNIQUE: Multidetector CT imaging of the head and cervical spine was performed following the standard protocol without intravenous contrast. Multiplanar CT image reconstructions of the cervical spine were also generated. COMPARISON:  None. FINDINGS: CT HEAD FINDINGS Brain: No evidence of acute infarction, hemorrhage, hydrocephalus, extra-axial collection or mass lesion/mass effect. Patchy white matter hypoattenuation, compatible with chronic microvascular ischemic disease. Mild generalized cerebral atrophy with ex vacuo ventricular dilation Vascular: Calcific atherosclerosis. Skull: Right frontal scalp contusion without underlying calvarial fracture. Sinuses/Orbits: No substantial paranasal sinus disease. No acute findings. Other: No mastoid effusions. CT CERVICAL SPINE FINDINGS Alignment: Mild (a few mm) of anterolisthesis of C4 on C5, favor degenerative given severe facet arthropathy is level. Otherwise, no substantial subluxation. Skull base and vertebrae: No acute fracture. Vertebral body heights are maintained. No primary bone lesion or focal pathologic process. Osteopenia. Soft tissues and spinal canal: No large spinal canal hematoma. Disc levels: Bulky multilevel facet hypertrophy with at least moderate foraminal stenosis at multiple levels. Bulky left a centric disc osteophyte and  uncovertebral hypertrophy at C6-C7. Fusion across the right-sided C3-C4 facet joint. Upper chest: Negative. Other: None. IMPRESSION: 1. No evidence of acute intracranial abnormality. 2. Multilevel bulky facet arthropathy with at least moderate foraminal stenosis at multiple levels. Electronically Signed   By: Feliberto Harts MD   On: 10/26/2019 13:13    Procedures Procedures (including critical care time)  Medications Ordered in ED Medications  predniSONE (DELTASONE) tablet 10 mg (has no administration in time range)    ED Course  I have reviewed the triage vital signs and the nursing notes.  Pertinent labs & imaging results that were available during my care of the patient were reviewed by  me and considered in my medical decision making (see chart for details).    MDM Rules/Calculators/A&P                          Patient is a 86 year old female who presents from nursing home after unwitnessed fall.  It appears that she may have fallen out of her wheelchair while trying to move although this is speculation.  She had a head CT and CT of her cervical spine which showed no acute abnormalities.  Her labs are nonconcerning.  Her glucose is mildly elevated.  She does not have a fever or other suggestions of infection.  She is at her baseline mental status per daughter.  Her daughter says that the patient just moved up from [place] and does have this ongoing rash to her face which typically responds to prednisone when it is flaring up.  She requests for [place] to give her a dose of prednisone in the ED because the nursing home will not have it started until tomorrow.  She was given her typical dose of prednisone in the ED.  She was discharged back to the nursing facility.  Return precautions were given. Final Clinical Impression(s) / ED Diagnoses Final diagnoses:  Fall, initial encounter  Injury of head, initial encounter    Rx / DC Orders ED Discharge Orders    None       Rolan Bucco,  MD 10/26/19 1421

## 2019-10-26 NOTE — Discharge Instructions (Addendum)
Return to the emergency room if the patient has any worsening symptoms including change in her mental status, vomiting, or other worsening symptoms.

## 2019-10-26 NOTE — ED Triage Notes (Signed)
Patient arrives to ED via [facility] from [facility] nursing home. Per staff patient was found on floor beside her wheelchair , previously seen 45 minutes before sitting up in wheelchair. Patient current GCS is 11 which is her baseline. Pt has hematoma to forehead. Pt is on eliquis and level 2 trauma called.

## 2020-01-22 ENCOUNTER — Emergency Department (HOSPITAL_COMMUNITY)
Admission: EM | Admit: 2020-01-22 | Discharge: 2020-01-23 | Disposition: A | Discharge status: Home / Self Care | Payer: Medicare Other | Attending: Emergency Medicine | Admitting: Emergency Medicine

## 2020-01-22 ENCOUNTER — Emergency Department (HOSPITAL_COMMUNITY): Payer: Medicare Other

## 2020-01-22 ENCOUNTER — Encounter (HOSPITAL_COMMUNITY): Payer: Self-pay

## 2020-01-22 DIAGNOSIS — Z87440 Personal history of urinary (tract) infections: Secondary | ICD-10-CM | POA: Insufficient documentation

## 2020-01-22 DIAGNOSIS — W050XXA Fall from non-moving wheelchair, initial encounter: Secondary | ICD-10-CM | POA: Diagnosis not present

## 2020-01-22 DIAGNOSIS — F0390 Unspecified dementia without behavioral disturbance: Secondary | ICD-10-CM | POA: Insufficient documentation

## 2020-01-22 DIAGNOSIS — R451 Restlessness and agitation: Secondary | ICD-10-CM | POA: Diagnosis not present

## 2020-01-22 DIAGNOSIS — S01112A Laceration without foreign body of left eyelid and periocular area, initial encounter: Secondary | ICD-10-CM | POA: Diagnosis not present

## 2020-01-22 DIAGNOSIS — Z7901 Long term (current) use of anticoagulants: Secondary | ICD-10-CM | POA: Insufficient documentation

## 2020-01-22 DIAGNOSIS — W19XXXA Unspecified fall, initial encounter: Secondary | ICD-10-CM

## 2020-01-22 DIAGNOSIS — S0990XA Unspecified injury of head, initial encounter: Secondary | ICD-10-CM | POA: Insufficient documentation

## 2020-01-22 LAB — URINALYSIS, ROUTINE W REFLEX MICROSCOPIC
Bilirubin Urine: NEGATIVE
Glucose, UA: NEGATIVE mg/dL
Ketones, ur: NEGATIVE mg/dL
Nitrite: NEGATIVE
Protein, ur: NEGATIVE mg/dL
RBC / HPF: >50 RBC/hpf — ABNORMAL HIGH (ref 0–5)
Specific Gravity, Urine: 1.012 (ref 1.005–1.030)
pH: 8 (ref 5.0–8.0)

## 2020-01-22 LAB — CBG MONITORING, ED: Glucose-Capillary: 120 mg/dL — ABNORMAL HIGH (ref 70–99)

## 2020-01-22 MED ORDER — ACETAMINOPHEN 325 MG PO TABS
650.0000 mg | ORAL_TABLET | Freq: Once | ORAL | Status: AC
  Administered 2020-01-22: 650 mg via ORAL
  Filled 2020-01-22: qty 2

## 2020-01-22 NOTE — ED Notes (Signed)
Pt is demented and refuses to leave BP cuff and leads on

## 2020-01-22 NOTE — ED Provider Notes (Signed)
[facility] EMERGENCY DEPARTMENT Provider Note   CSN: [id number] Arrival date & time: 01/22/20  2016     History Chief Complaint  Patient presents with  . Fall    Danahi Reddish is a 86 y.o. female.  Patient is a 86 year old female who presents as a level 2 trauma after a fall on Eliquis.  She lives at [facility].  She was seen in a wheelchair in fell off to the left side out of her wheelchair.  She hit her head on the ground.  She has bruising to her head with some bleeding.  She is on Eliquis for an unknown reason.  Patient is nonverbal with dementia.  She reportedly is at her baseline mental status per EMS.  History is limited due to her dementia.        History reviewed. No pertinent past medical history.  There are no problems to display for this patient.   History reviewed. No pertinent surgical history.   OB History   No obstetric history on file.     No family history on file.     Home Medications Prior to Admission medications   Medication Sig Start Date End Date Taking? Authorizing Provider  acetaminophen (TYLENOL) 325 MG tablet Take 650 mg by mouth every 4 (four) hours as needed (for pain or a temperature greater than 100 F).   Yes [provider]  alendronate-cholecalciferol (FOSAMAX PLUS D) 70-2800 MG-UNIT tablet Take 1 tablet by mouth every Sunday. Take with a full glass of water on an empty stomach.   Yes [provider]  apixaban (ELIQUIS) 5 MG TABS tablet Take 5 mg by mouth 2 (two) times daily.   Yes [provider]  bisacodyl (DULCOLAX) 10 MG suppository Place 10 mg rectally daily as needed (for constipation- "if no BM by the 4th day").   Yes [provider]  Calcium Carbonate-Vitamin D 600-400 MG-UNIT tablet Take 1 tablet by mouth daily.   Yes [provider]  fluticasone-salmeterol (ADVAIR HFA) 45-21 MCG/ACT inhaler Inhale 1 puff into the lungs 2 (two) times daily.   Yes  [provider]  ipratropium (ATROVENT) 0.06 % nasal spray Place 2 sprays into both nostrils 3 (three) times daily before meals.   Yes [provider]  levothyroxine (SYNTHROID) 75 MCG tablet Take 75 mcg by mouth daily before breakfast.   Yes [provider]  magnesium hydroxide (MILK OF MAGNESIA) 400 MG/5ML suspension Take 30 mLs by mouth daily as needed (for constipation- "if no BM in 3 days").   Yes [provider]  Multiple Vitamins-Minerals (ONE-A-DAY PROACTIVE 65+) TABS Take 1 tablet by mouth daily with breakfast.   Yes [provider]  predniSONE (DELTASONE) 5 MG tablet Take 5 mg by mouth daily with breakfast.   Yes [provider]  senna (SENOKOT) 8.6 MG TABS tablet Take 1 tablet by mouth in the morning.   Yes [provider]  simvastatin (ZOCOR) 10 MG tablet Take 10 mg by mouth every other day.   Yes [provider]  tamsulosin (FLOMAX) 0.4 MG CAPS capsule Take 0.4 mg by mouth at bedtime.   Yes [provider]    Allergies    Penicillin g  Review of Systems   Review of Systems  Unable to perform ROS: Dementia    Physical Exam Updated Vital Signs BP (!) 163/85   Pulse 78   Temp 98.1 F (36.7 C)   Resp 19   Ht 5'  4" (1.626 m)   Wt 59 kg   SpO2 96%   BMI 22.31 kg/m   Physical Exam Constitutional:      Appearance: She is well-developed and well-nourished.  HENT:     Head: Normocephalic.     Comments: Patient has a moderate sized hematoma to the left forehead over the eyebrow.  There is a small skin tear with overlying abrasion.  No suturable lacerations.  No active bleeding Eyes:     Pupils: Pupils are equal, round, and reactive to light.  Neck:     Comments: C-collar is in place.  There is no palpable tenderness along the cervical, thoracic or lumbosacral spine Cardiovascular:     Rate and Rhythm: Normal rate and regular rhythm.     Heart sounds: Normal heart sounds.  Pulmonary:      Effort: Pulmonary effort is normal. No respiratory distress.     Breath sounds: Normal breath sounds. No wheezing or rales.  Chest:     Chest wall: No tenderness.  Abdominal:     General: Bowel sounds are normal.     Palpations: Abdomen is soft.     Tenderness: There is no abdominal tenderness. There is no guarding or rebound.  Musculoskeletal:        General: No edema. Normal range of motion.     Comments: No pain on palpation or range of motion the extremities.  She has some old appearing bruising to her right upper arm without underlying tenderness.  She has some small skin tears to her right lower leg with a dressing overlying these wounds that is marked December 23.  She does not have any drainage or signs of infection.  They appear to be well-healing.  Lymphadenopathy:     Cervical: No cervical adenopathy.  Skin:    General: Skin is warm and dry.     Findings: No rash.  Neurological:     Mental Status: She is alert.     Comments: Patient is awake with eyes open.  She is nonverbal.  She will Grippi her fingers but does not follow commands.  She seems to have symmetric strength bilaterally.  Psychiatric:        Mood and Affect: Mood and affect normal.     ED Results / Procedures / Treatments   Labs (all labs ordered are listed, but only abnormal results are displayed) Labs Reviewed  URINALYSIS, ROUTINE W REFLEX MICROSCOPIC - Abnormal; Notable for the following components:      Result Value   APPearance CLOUDY (*)    Hgb urine dipstick MODERATE (*)    Leukocytes,Ua SMALL (*)    RBC / HPF >50 (*)    Bacteria, UA RARE (*)    All other components within normal limits  CBG MONITORING, ED - Abnormal; Notable for the following components:   Glucose-Capillary 120 (*)    All other components within normal limits  URINE CULTURE    EKG None  Radiology CT Head Wo Contrast  Result Date: 01/22/2020 CLINICAL DATA:  Fall.  On Eliquis. EXAM: CT HEAD WITHOUT CONTRAST CT CERVICAL  SPINE WITHOUT CONTRAST TECHNIQUE: Multidetector CT imaging of the head and cervical spine was performed following the standard protocol without intravenous contrast. Multiplanar CT image reconstructions of the cervical spine were also generated. COMPARISON:  10/26/2019 FINDINGS: CT HEAD FINDINGS Brain: There is no mass, hemorrhage or extra-axial collection. The size and configuration of the ventricles and extra-axial CSF spaces are normal. There is hypoattenuation of the periventricular  white matter, most commonly indicating chronic ischemic microangiopathy. Vascular: Atherosclerotic calcification of the internal carotid arteries at the skull base. No abnormal hyperdensity of the major intracranial arteries or dural venous sinuses. Skull: Small left frontal scalp hematoma.  No skull fracture. Sinuses/Orbits: No fluid levels or advanced mucosal thickening of the visualized paranasal sinuses. No mastoid or middle ear effusion. The orbits are normal. CT CERVICAL SPINE FINDINGS Alignment: No static subluxation. Facets are aligned. Occipital condyles are normally positioned. Skull base and vertebrae: No acute fracture. Soft tissues and spinal canal: No prevertebral fluid or swelling. No visible canal hematoma. Disc levels: Severe left uncovertebral degeneration at C1-2. Multilevel severe facet arthrosis. Mild spinal canal stenosis at C5-6. Bulky anterior osteophytes at C4-T1. Upper chest: No pneumothorax, pulmonary nodule or pleural effusion. Other: Normal visualized paraspinal cervical soft tissues. IMPRESSION: 1. Chronic ischemic microangiopathy without acute intracranial abnormality. 2. Small left frontal scalp hematoma without skull fracture. 3. No acute fracture or static subluxation of the cervical spine. Electronically Signed   By: Deatra RobinsonKevin  Herman M.D.   On: 01/22/2020 21:13   CT Cervical Spine Wo Contrast  Result Date: 01/22/2020 CLINICAL DATA:  Fall.  On Eliquis. EXAM: CT HEAD WITHOUT CONTRAST CT CERVICAL  SPINE WITHOUT CONTRAST TECHNIQUE: Multidetector CT imaging of the head and cervical spine was performed following the standard protocol without intravenous contrast. Multiplanar CT image reconstructions of the cervical spine were also generated. COMPARISON:  10/26/2019 FINDINGS: CT HEAD FINDINGS Brain: There is no mass, hemorrhage or extra-axial collection. The size and configuration of the ventricles and extra-axial CSF spaces are normal. There is hypoattenuation of the periventricular white matter, most commonly indicating chronic ischemic microangiopathy. Vascular: Atherosclerotic calcification of the internal carotid arteries at the skull base. No abnormal hyperdensity of the major intracranial arteries or dural venous sinuses. Skull: Small left frontal scalp hematoma.  No skull fracture. Sinuses/Orbits: No fluid levels or advanced mucosal thickening of the visualized paranasal sinuses. No mastoid or middle ear effusion. The orbits are normal. CT CERVICAL SPINE FINDINGS Alignment: No static subluxation. Facets are aligned. Occipital condyles are normally positioned. Skull base and vertebrae: No acute fracture. Soft tissues and spinal canal: No prevertebral fluid or swelling. No visible canal hematoma. Disc levels: Severe left uncovertebral degeneration at C1-2. Multilevel severe facet arthrosis. Mild spinal canal stenosis at C5-6. Bulky anterior osteophytes at C4-T1. Upper chest: No pneumothorax, pulmonary nodule or pleural effusion. Other: Normal visualized paraspinal cervical soft tissues. IMPRESSION: 1. Chronic ischemic microangiopathy without acute intracranial abnormality. 2. Small left frontal scalp hematoma without skull fracture. 3. No acute fracture or static subluxation of the cervical spine. Electronically Signed   By: Deatra RobinsonKevin  Herman M.D.   On: 01/22/2020 21:13    Procedures Procedures (including critical care time)  Medications Ordered in ED Medications  acetaminophen (TYLENOL) tablet 650 mg  (650 mg Oral Given 01/22/20 2257)    ED Course  I have reviewed the triage vital signs and the nursing notes.  Pertinent labs & imaging results that were available during my care of the patient were reviewed by me and considered in my medical decision making (see chart for details).    MDM Rules/Calculators/A&P                          Patient is a 86 year old female who presents after a fall.  It was unwitnessed.  Daughter is now at bedside and states that patient tries to get up frequently and get out of  the wheelchair.  Apparently they are not able to put a vest to keep her in the wheelchair.  She has a hematoma and abrasion/skin tear to her forehead.  She is on Eliquis which her daughter says is for prior DVT.  She had a CT scan of her head and cervical spine which showed no acute abnormalities.  No evidence of intracranial hemorrhage.  Her daughter states that sometimes she gets more fidgety and tries to get out more when she has a UTI and request that we check a urine.  We did obtain a urine specimen.  It is not overwhelmingly convincing for an infection.  It was sent for culture.  I discussed this with the daughter and that we will hold off on antibiotics until the culture comes back.  She is afebrile and currently at baseline mental status.  Her tetanus shot is up-to-date per the daughter.  She was discharged home in good condition.  Return precautions were given. Final Clinical Impression(s) / ED Diagnoses Final diagnoses:  Fall, initial encounter  Injury of head, initial encounter    Rx / DC Orders ED Discharge Orders    None       Rolan Bucco, MD 01/22/20 2328

## 2020-01-22 NOTE — Discharge Instructions (Signed)
Use antibiotic ointment to the wound.  Apply ice to the area to help with the swelling.  Return here as needed if she has any worsening symptoms including change in mental status, vomiting, increased weakness, fevers or other worsening symptoms.

## 2020-01-22 NOTE — ED Triage Notes (Signed)
Pt comes from [facility] senior living via [facility] after fall out of wheelchair, lac to L eyebrow, on Eliquis, unwitnessed, pt is nonverbal at baseline, dementia.

## 2020-01-22 NOTE — Progress Notes (Signed)
Chaplain responded to this level II fall.  Patient arrived and being evaluated.  EMT indicated family was aware and on the way.  No immediate needs.  Chaplain available to support family as needed. Chaplain Agustin Cree, Mdiv.    01/22/20 2100  Clinical Encounter Type  Visited With Health care provider  Visit Type Trauma  Referral From Nurse  Consult/Referral To Chaplain

## 2020-01-22 NOTE — ED Notes (Signed)
Patient transported to CT 

## 2020-01-22 NOTE — ED Notes (Signed)
Called [facility] for transport to [facility]

## 2020-01-24 LAB — URINE CULTURE

## 2020-01-27 ENCOUNTER — Encounter (HOSPITAL_COMMUNITY): Payer: Self-pay

## 2020-01-27 ENCOUNTER — Emergency Department (HOSPITAL_COMMUNITY): Payer: Medicare Other

## 2020-01-27 ENCOUNTER — Emergency Department (HOSPITAL_COMMUNITY)
Admission: EM | Admit: 2020-01-27 | Discharge: 2020-01-27 | Disposition: A | Discharge status: Home / Self Care | Payer: Medicare Other | Attending: Emergency Medicine | Admitting: Emergency Medicine

## 2020-01-27 ENCOUNTER — Other Ambulatory Visit: Payer: Self-pay

## 2020-01-27 DIAGNOSIS — I10 Essential (primary) hypertension: Secondary | ICD-10-CM | POA: Diagnosis not present

## 2020-01-27 DIAGNOSIS — Z7901 Long term (current) use of anticoagulants: Secondary | ICD-10-CM | POA: Insufficient documentation

## 2020-01-27 DIAGNOSIS — N200 Calculus of kidney: Secondary | ICD-10-CM | POA: Diagnosis not present

## 2020-01-27 DIAGNOSIS — F0390 Unspecified dementia without behavioral disturbance: Secondary | ICD-10-CM | POA: Insufficient documentation

## 2020-01-27 DIAGNOSIS — J449 Chronic obstructive pulmonary disease, unspecified: Secondary | ICD-10-CM | POA: Diagnosis not present

## 2020-01-27 DIAGNOSIS — W050XXA Fall from non-moving wheelchair, initial encounter: Secondary | ICD-10-CM | POA: Diagnosis not present

## 2020-01-27 DIAGNOSIS — Z20822 Contact with and (suspected) exposure to covid-19: Secondary | ICD-10-CM | POA: Diagnosis not present

## 2020-01-27 DIAGNOSIS — R109 Unspecified abdominal pain: Secondary | ICD-10-CM | POA: Diagnosis present

## 2020-01-27 DIAGNOSIS — Z7951 Long term (current) use of inhaled steroids: Secondary | ICD-10-CM | POA: Insufficient documentation

## 2020-01-27 LAB — URINALYSIS, ROUTINE W REFLEX MICROSCOPIC
Bilirubin Urine: NEGATIVE
Glucose, UA: NEGATIVE mg/dL
Ketones, ur: 15 mg/dL — AB
Nitrite: NEGATIVE
Specific Gravity, Urine: 1.02 (ref 1.005–1.030)
pH: 8.5 — ABNORMAL HIGH (ref 5.0–8.0)

## 2020-01-27 LAB — COMPREHENSIVE METABOLIC PANEL
ALT: 12 U/L (ref 0–44)
AST: 12 U/L — ABNORMAL LOW (ref 15–41)
Albumin: 3.4 g/dL — ABNORMAL LOW (ref 3.5–5.0)
Alkaline Phosphatase: 45 U/L (ref 38–126)
Anion gap: 7 (ref 5–15)
BUN: 19 mg/dL (ref 8–23)
CO2: 27 mmol/L (ref 22–32)
Calcium: 9.1 mg/dL (ref 8.9–10.3)
Chloride: 106 mmol/L (ref 98–111)
Creatinine, Ser: 0.7 mg/dL (ref 0.44–1.00)
GFR, Estimated: >60 mL/min (ref >60)
Glucose, Bld: 108 mg/dL — ABNORMAL HIGH (ref 70–99)
Potassium: 3.9 mmol/L (ref 3.5–5.1)
Sodium: 140 mmol/L (ref 135–145)
Total Bilirubin: 0.7 mg/dL (ref 0.3–1.2)
Total Protein: 6.2 g/dL — ABNORMAL LOW (ref 6.5–8.1)

## 2020-01-27 LAB — CBC
HCT: 39.8 % (ref 36.0–46.0)
Hemoglobin: 13.1 g/dL (ref 12.0–15.0)
MCH: 32.5 pg (ref 26.0–34.0)
MCHC: 32.9 g/dL (ref 30.0–36.0)
MCV: 98.8 fL (ref 80.0–100.0)
Platelets: 259 10*3/uL (ref 150–400)
RBC: 4.03 MIL/uL (ref 3.87–5.11)
RDW: 12.5 % (ref 11.5–15.5)
WBC: 7.8 10*3/uL (ref 4.0–10.5)
nRBC: 0 % (ref 0.0–0.2)

## 2020-01-27 LAB — RESP PANEL BY RT-PCR (FLU A&B, COVID) ARPGX2
Influenza A by PCR: NEGATIVE
Influenza B by PCR: NEGATIVE
SARS Coronavirus 2 by RT PCR: NEGATIVE

## 2020-01-27 LAB — URINALYSIS, MICROSCOPIC (REFLEX)

## 2020-01-27 MED ORDER — KETOROLAC TROMETHAMINE 30 MG/ML IJ SOLN
15.0000 mg | Freq: Once | INTRAMUSCULAR | Status: AC
  Administered 2020-01-27: 15 mg via INTRAVENOUS
  Filled 2020-01-27: qty 1

## 2020-01-27 MED ORDER — SODIUM CHLORIDE 0.9 % IV BOLUS
500.0000 mL | Freq: Once | INTRAVENOUS | Status: AC
  Administered 2020-01-27: 500 mL via INTRAVENOUS

## 2020-01-27 MED ORDER — CEPHALEXIN 500 MG PO CAPS: 500.0000 mg | ORAL_CAPSULE | Freq: Four times a day (QID) | ORAL | 0 refills | Status: DC

## 2020-01-27 MED ORDER — IOHEXOL 300 MG/ML  SOLN
100.0000 mL | Freq: Once | INTRAMUSCULAR | Status: AC | PRN
  Administered 2020-01-27: 100 mL via INTRAVENOUS

## 2020-01-27 MED ORDER — SODIUM CHLORIDE 0.9 % IV SOLN
2.0000 g | Freq: Once | INTRAVENOUS | Status: AC
  Administered 2020-01-27: 2 g via INTRAVENOUS
  Filled 2020-01-27: qty 20

## 2020-01-27 NOTE — Discharge Instructions (Signed)
Call [facility] Monday morning for follow up Take antibiotics as prescribed Return if fever, vomiting or other worsening symptoms

## 2020-01-27 NOTE — ED Provider Notes (Signed)
[facility]-EMERGENCY DEPT Provider Note   CSN: [id number] Arrival date & time: 01/27/20  1122     History No chief complaint on file.   Jaclyn Weaver is a 87 y.o. female.  HPI     Level 5 caveat secondary to dementia  87 yo female presents to ED from [facility].  Discussed with Lurena Joiner at [facility]  Reported that she was crying in pain and appeared to have abdominal pain and distension.  She speaks occasionally.  She was at baseline yesterday.  Selp propelling through community in wheelchair yesterday.  THey were unable to contact son.  SHe has [name] with her. Last po last night- no vomiting or diarrhea Discussed with daughter via phone Covid vaccine- x2, no booster Patient with recent fall out of wheelchair after leaning out too far.  2:30 PM Daughter at bedside.  Mother has been sleeping since she has been in room.  She feels patient may have been moaning as she needed to have bowel movement.  She states mother is nonverbal and will moan in response to needs like stooling.   Past Medical History:  Diagnosis Date  . Constipation    from facility paperwork  . COPD (chronic obstructive pulmonary disease) (HCC)   . Dementia (HCC)    from facility paperwork  . Dermatitis    from facility paperwork  . Hyperlipidemia    from facility paperwork  . Hypertension   . Osteoporosis    from facility paperwork  . Urinary incontinence    from facility paperwork    There are no problems to display for this patient.   History reviewed. No pertinent surgical history.   OB History   No obstetric history on file.     No family history on file.     Home Medications Prior to Admission medications   Medication Sig Start Date End Date Taking? Authorizing Provider  acetaminophen (TYLENOL) 325 MG tablet Take 650 mg by mouth every 4 (four) hours as needed for mild pain or fever.    [provider]  acetaminophen (TYLENOL) 325 MG tablet  Take 650 mg by mouth every 4 (four) hours as needed (for pain or a temperature greater than 100 F).    [provider]  alendronate-cholecalciferol (FOSAMAX PLUS D) 70-2800 MG-UNIT tablet Take 1 tablet by mouth every 7 (seven) days. Take with a full glass of water on an empty stomach.    [provider]  alendronate-cholecalciferol (FOSAMAX PLUS D) 70-2800 MG-UNIT tablet Take 1 tablet by mouth every Sunday. Take with a full glass of water on an empty stomach.    [provider]  apixaban (ELIQUIS) 5 MG TABS tablet Take 5 mg by mouth 2 (two) times daily.    [provider]  apixaban (ELIQUIS) 5 MG TABS tablet Take 5 mg by mouth 2 (two) times daily.    [provider]  bisacodyl (DULCOLAX) 10 MG suppository Place 10 mg rectally daily as needed for moderate constipation.    [provider]  bisacodyl (DULCOLAX) 10 MG suppository Place 10 mg rectally daily as needed (for constipation- "if no BM by the 4th day").    [provider]  Calcium Carbonate-Vitamin D 600-400 MG-UNIT tablet Take 1 tablet by mouth daily.    [provider]  Calcium Carbonate-Vitamin D3 (CALCIUM 600-D) 600-400 MG-UNIT TABS Take 1 tablet by mouth daily.    [provider]  Cranberry 450 MG TABS Take 450 tablets by mouth daily.  [provider]  doxycycline (ADOXA) 100 MG tablet Take 100 mg by mouth 2 (two) times daily. 10/20/19   [provider]  fluticasone-salmeterol (ADVAIR HFA) 45-21 MCG/ACT inhaler Inhale 1 puff into the lungs 2 (two) times daily.    [provider]  fluticasone-salmeterol (ADVAIR HFA) 45-21 MCG/ACT inhaler Inhale 1 puff into the lungs 2 (two) times daily.    [provider]  ipratropium (ATROVENT) 0.06 % nasal spray Place 2 sprays into both nostrils in the morning and at bedtime.    [provider]  ipratropium (ATROVENT) 0.06 % nasal spray Place 2 sprays into both nostrils 3 (three)  times daily before meals.    [provider]  levothyroxine (SYNTHROID) 75 MCG tablet Take 75 mcg by mouth daily before breakfast.    [provider]  levothyroxine (SYNTHROID) 75 MCG tablet Take 75 mcg by mouth daily before breakfast.    [provider]  magnesium hydroxide (MILK OF MAGNESIA) 400 MG/5ML suspension Take 30 mLs by mouth daily as needed for mild constipation (if no BM in 3 days).    [provider]  magnesium hydroxide (MILK OF MAGNESIA) 400 MG/5ML suspension Take 30 mLs by mouth daily as needed (for constipation- "if no BM in 3 days").    [provider]  Multiple Vitamins-Minerals (MULTIVITAMIN WITH MINERALS) tablet Take 1 tablet by mouth daily.    [provider]  Multiple Vitamins-Minerals (ONE-A-DAY PROACTIVE 65+) TABS Take 1 tablet by mouth daily with breakfast.    [provider]  predniSONE (DELTASONE) 5 MG tablet Take 5 mg by mouth daily with breakfast.    [provider]  predniSONE (DELTASONE) 5 MG tablet Take 5 mg by mouth daily with breakfast.    [provider]  senna (SENOKOT) 8.6 MG TABS tablet Take 1 tablet by mouth daily.    [provider]  senna (SENOKOT) 8.6 MG TABS tablet Take 1 tablet by mouth in the morning.    [provider]  simvastatin (ZOCOR) 10 MG tablet Take 10 mg by mouth every other day.    [provider]  simvastatin (ZOCOR) 10 MG tablet Take 10 mg by mouth every other day.    [provider]  tamsulosin (FLOMAX) 0.4 MG CAPS capsule Take 0.4 mg by mouth at bedtime.    [provider]  tamsulosin (FLOMAX) 0.4 MG CAPS capsule Take 0.4 mg by mouth at bedtime.    [provider]    Allergies    Penicillin g and Penicillins  Review of Systems   Review of Systems  Unable to perform ROS: Dementia  All other systems reviewed and are negative.   Physical Exam Updated Vital Signs There were no vitals taken for this  visit.  Physical Exam Vitals and nursing note reviewed.  Constitutional:      General: She is not in acute distress.    Appearance: Normal appearance. She is ill-appearing.  HENT:     Head: Normocephalic.     Comments: Contusion left periorbital area    Right Ear: External ear normal.     Left Ear: External ear normal.     Nose: Nose normal.     Mouth/Throat:     Mouth: Mucous membranes are dry.  Eyes:     Extraocular Movements: Extraocular movements intact.     Pupils: Pupils are equal, round, and reactive to light.  Cardiovascular:     Rate and Rhythm: Normal rate.     Pulses:  Normal pulses.     Heart sounds: Normal heart sounds.  Pulmonary:     Effort: Pulmonary effort is normal.     Breath sounds: Normal breath sounds.  Abdominal:     General: Abdomen is flat.     Palpations: Abdomen is soft. There is mass.     Comments: Epigastric mass on palpation  Genitourinary:    Comments: Rectum with stool - disimpacted but some remaining stool palpable  Musculoskeletal:        General: Normal range of motion.     Cervical back: Normal range of motion.  Skin:    General: Skin is warm and dry.     Capillary Refill: Capillary refill takes less than 2 seconds.  Neurological:     General: No focal deficit present.     Mental Status: She is alert.  Psychiatric:        Mood and Affect: Mood normal.        Behavior: Behavior normal.     ED Results / Procedures / Treatments   Labs (all labs ordered are listed, but only abnormal results are displayed) Labs Reviewed - No data to display  EKG None  Radiology CT Head Wo Contrast  Result Date: 01/27/2020 CLINICAL DATA:  Fall 1 week ago EXAM: CT HEAD WITHOUT CONTRAST TECHNIQUE: Contiguous axial images were obtained from the base of the skull through the vertex without intravenous contrast. COMPARISON:  01/22/2020 FINDINGS: Brain: There is atrophy and chronic small vessel disease changes. No acute intracranial abnormality.  Specifically, no hemorrhage, hydrocephalus, mass lesion, acute infarction, or significant intracranial injury. Vascular: No hyperdense vessel or unexpected calcification. Skull: No acute calvarial abnormality. Sinuses/Orbits: No acute findings Other: Soft tissue swelling in the left forehead IMPRESSION: Atrophy, chronic microvascular disease. No acute intracranial abnormality. Electronically Signed   By: Charlett Nose M.D.   On: 01/27/2020 14:30   CT ABDOMEN PELVIS W CONTRAST  Result Date: 01/27/2020 CLINICAL DATA:  Abdominal distension.  Recent fall. EXAM: CT ABDOMEN AND PELVIS WITH CONTRAST TECHNIQUE: Multidetector CT imaging of the abdomen and pelvis was performed using the standard protocol following bolus administration of intravenous contrast. CONTRAST:  OMNIPAQUE IOHEXOL 300 MG/ML  SOLN COMPARISON:  None. FINDINGS: Lower chest: No acute abnormality.  Moderate/large hiatal hernia. Hepatobiliary: No focal liver abnormality is seen. No gallstones, gallbladder wall thickening, or biliary dilatation. Pancreas: Unremarkable. No pancreatic ductal dilatation or surrounding inflammatory changes. Spleen: Normal in size without focal abnormality. Adrenals/Urinary Tract: Mild to moderate left hydronephrosis. There is a 5 mm calculus located at the left ureteropelvic junction. Delayed imaging shows no significant delayed excretion of contrast into the left renal collecting system compared to the right. No other calculi identified. The adrenal glands are unremarkable. The bladder demonstrates a small area of increased density along the posterior left bladder wall near the left UVJ. This may relate to some focal hemorrhage in this region due to the UPJ calculus. Focal bladder neoplasm cannot be excluded. Stomach/Bowel: Bowel shows no evidence of obstruction, ileus, inflammation or lesion. No free intraperitoneal air identified. Vascular/Lymphatic: Mild calcified plaque in the abdominal aorta without aneurysm. No  enlarged lymph nodes are identified. Reproductive: Status post hysterectomy. No adnexal masses. Other: Small midline ventral hernia superior to the umbilicus contains fat. No evidence of subcutaneous or abdominal wall hemorrhage. No abscess or free fluid identified in the abdomen or pelvis. Musculoskeletal: No fracture or bony lesions identified. IMPRESSION: 1. 5 mm calculus at the left ureteropelvic junction causing mild to moderate  left hydronephrosis. 2. Increased density along the posterior left bladder wall near the trigone/UVJ region may relate to focal hemorrhage or bladder lesion. 3. Small midline ventral hernia superior to the umbilicus containing fat. 4. Moderate to large hiatal hernia. Electronically Signed   By: Aletta Edouard M.D.   On: 01/27/2020 14:42   DG Chest Port 1 View  Result Date: 01/27/2020 CLINICAL DATA:  Abdominal pain EXAM: PORTABLE CHEST 1 VIEW COMPARISON:  None. FINDINGS: Mild hyperinflation. Heart and mediastinal contours are within normal limits. No focal opacities or effusions. No acute bony abnormality. IMPRESSION: No active cardiopulmonary disease. Electronically Signed   By: Rolm Baptise M.D.   On: 01/27/2020 12:33    Procedures Procedures (including critical care time)  Medications Ordered in ED Medications - No data to display  ED Course  I have reviewed the triage vital signs and the nursing notes.  Pertinent labs & imaging results that were available during my care of the patient were reviewed by me and considered in my medical decision making (see chart for details).    MDM Rules/Calculators/A&P                          Discussed findings with daughter.  Patient does not appear to be any discomfort at this time.  She is given 2 g of Rocephin here and urine is cultured. She will be given Toradol 15 mg and IV fluids.  Plan outpatient follow-up with urology.  Return precautions including fever worsening pain given. Daughter understands that follow-up by  urology required for the stone and other abnormality seen on CT scan today. F Clinical Impression(s) / ED Diagnoses Final diagnoses:  Kidney stone on left side    Rx / DC Orders ED Discharge Orders    None       Pattricia Boss, MD 01/27/20 [phone]

## 2020-01-27 NOTE — ED Notes (Signed)
[facility] arrived to pick up patient  Family member at bedside

## 2020-01-27 NOTE — ED Notes (Signed)
Family at bedside. 

## 2020-01-27 NOTE — ED Triage Notes (Signed)
Patient from [facility] for a complaint of abdominal distention per nursing home staff. Patient is nonverbal at baseline.

## 2020-01-29 LAB — URINE CULTURE: Culture: >=100000 — AB

## 2020-01-30 ENCOUNTER — Telehealth: Payer: Self-pay

## 2020-01-30 NOTE — Progress Notes (Signed)
ED Antimicrobial Stewardship Positive Culture Follow Up   Jaclyn Weaver is an 86 y.o. female who presented to [facility] on 01/27/2020 with a chief complaint of No chief complaint on file.   Recent Results (from the past 720 hour(s))  Urine culture     Status: Abnormal   Collection Time: 01/22/20 10:36 PM   Specimen: Urine, Random  Result Value Ref Range Status   Specimen Description URINE, RANDOM  Final   Special Requests   Final    NONE Performed at [facility], [address], [place], [place] [address]    Culture MULTIPLE SPECIES PRESENT, SUGGEST RECOLLECTION (A)  Final   Report Status 01/24/2020 FINAL  Final  Resp Panel by RT-PCR (Flu A&B, Covid) Nasopharyngeal Swab     Status: None   Collection Time: 01/27/20  1:08 PM   Specimen: Nasopharyngeal Swab; Nasopharyngeal(NP) swabs in vial transport medium  Result Value Ref Range Status   SARS Coronavirus 2 by RT PCR NEGATIVE NEGATIVE Final    Comment: (NOTE) SARS-CoV-2 target nucleic acids are NOT DETECTED.  The SARS-CoV-2 RNA is generally detectable in upper respiratory specimens during the acute phase of infection. The lowest concentration of SARS-CoV-2 viral copies this assay can detect is 138 copies/mL. A negative result does not preclude SARS-Cov-2 infection and should not be used as the sole basis for treatment or other patient management decisions. A negative result may occur with  improper specimen collection/handling, submission of specimen other than nasopharyngeal swab, presence of viral mutation(s) within the areas targeted by this assay, and inadequate number of viral copies(<138 copies/mL). A negative result must be combined with clinical observations, patient history, and epidemiological information. The expected result is Negative.  Fact Sheet for Patients:  BloggerCourse.com  Fact Sheet for Healthcare Providers:  SeriousBroker.it  This test is no t  yet approved or cleared by the [place] FDA and  has been authorized for detection and/or diagnosis of SARS-CoV-2 by FDA under an Emergency Use Authorization (EUA). This EUA will remain  in effect (meaning this test can be used) for the duration of the COVID-19 declaration under Section 564(b)(1) of the Act, 21 U.S.C.section 360bbb-3(b)(1), unless the authorization is terminated  or revoked sooner.       Influenza A by PCR NEGATIVE NEGATIVE Final   Influenza B by PCR NEGATIVE NEGATIVE Final    Comment: (NOTE) The Xpert Xpress SARS-CoV-2/FLU/RSV plus assay is intended as an aid in the diagnosis of influenza from Nasopharyngeal swab specimens and should not be used as a sole basis for treatment. Nasal washings and aspirates are unacceptable for Xpert Xpress SARS-CoV-2/FLU/RSV testing.  Fact Sheet for Patients: BloggerCourse.com  Fact Sheet for Healthcare Providers: SeriousBroker.it  This test is not yet approved or cleared by the [place] FDA and has been authorized for detection and/or diagnosis of SARS-CoV-2 by FDA under an Emergency Use Authorization (EUA). This EUA will remain in effect (meaning this test can be used) for the duration of the COVID-19 declaration under Section 564(b)(1) of the Act, 21 U.S.C. section 360bbb-3(b)(1), unless the authorization is terminated or revoked.  Performed at [facility], [address] [place], [place] [address]   Urine Culture     Status: Abnormal   Collection Time: 01/27/20  1:08 PM   Specimen: Urine, Catheterized  Result Value Ref Range Status   Specimen Description   Final    URINE, CATHETERIZED Performed at [facility], [address], [place], [place] [address]  Special Requests   Final    NONE Performed at [facility], [address] [place], [place] [address]    Culture >=100,000 COLONIES/mL  ENTEROCOCCUS FAECALIS (A)  Final   Report Status 01/29/2020 FINAL  Final   Organism ID, Bacteria ENTEROCOCCUS FAECALIS (A)  Final      Susceptibility   Enterococcus faecalis - MIC*    AMPICILLIN <=2 SENSITIVE Sensitive     NITROFURANTOIN <=16 SENSITIVE Sensitive     VANCOMYCIN 1 SENSITIVE Sensitive     * >=100,000 COLONIES/mL ENTEROCOCCUS FAECALIS    [x]  Treated with cephalexin, organism resistant to prescribed antimicrobial []  Patient discharged originally without antimicrobial agent and treatment is now indicated  New antibiotic prescription: Linezolid 600 mg BID X 5 days   ED Provider: , PA-C   , PharmD, BCPS, BCIDP Infectious Diseases Clinical Pharmacist Phone: [phone] 01/30/2020, 8:36 AM Clinical Pharmacist

## 2020-01-30 NOTE — Telephone Encounter (Signed)
Fax sent to [facility] at [phone] Attn nursing dept, for change in abx and f/u to urology.  Change to Linezoid 600 mg BID x 5 days per Langston Masker [facility]

## 2020-02-01 ENCOUNTER — Other Ambulatory Visit: Payer: Self-pay

## 2020-02-01 ENCOUNTER — Inpatient Hospital Stay (HOSPITAL_COMMUNITY)
Admission: EM | Admit: 2020-02-01 | Discharge: 2020-02-06 | DRG: 854 | Disposition: A | Discharge status: Skilled Nursing Facility | Payer: Medicare Other | Attending: Internal Medicine | Admitting: Internal Medicine

## 2020-02-01 ENCOUNTER — Emergency Department (HOSPITAL_COMMUNITY): Payer: Medicare Other

## 2020-02-01 ENCOUNTER — Encounter (HOSPITAL_COMMUNITY): Payer: Self-pay

## 2020-02-01 DIAGNOSIS — N136 Pyonephrosis: Secondary | ICD-10-CM | POA: Diagnosis present

## 2020-02-01 DIAGNOSIS — N132 Hydronephrosis with renal and ureteral calculous obstruction: Secondary | ICD-10-CM

## 2020-02-01 DIAGNOSIS — N201 Calculus of ureter: Secondary | ICD-10-CM

## 2020-02-01 DIAGNOSIS — J449 Chronic obstructive pulmonary disease, unspecified: Secondary | ICD-10-CM

## 2020-02-01 DIAGNOSIS — Z20822 Contact with and (suspected) exposure to covid-19: Secondary | ICD-10-CM | POA: Diagnosis present

## 2020-02-01 DIAGNOSIS — F0390 Unspecified dementia without behavioral disturbance: Secondary | ICD-10-CM

## 2020-02-01 DIAGNOSIS — M7989 Other specified soft tissue disorders: Secondary | ICD-10-CM

## 2020-02-01 DIAGNOSIS — J45901 Unspecified asthma with (acute) exacerbation: Secondary | ICD-10-CM | POA: Diagnosis present

## 2020-02-01 DIAGNOSIS — E785 Hyperlipidemia, unspecified: Secondary | ICD-10-CM | POA: Diagnosis present

## 2020-02-01 DIAGNOSIS — A4152 Sepsis due to Pseudomonas: Secondary | ICD-10-CM | POA: Diagnosis not present

## 2020-02-01 DIAGNOSIS — Z66 Do not resuscitate: Secondary | ICD-10-CM | POA: Diagnosis present

## 2020-02-01 DIAGNOSIS — J4541 Moderate persistent asthma with (acute) exacerbation: Secondary | ICD-10-CM

## 2020-02-01 DIAGNOSIS — M810 Age-related osteoporosis without current pathological fracture: Secondary | ICD-10-CM | POA: Diagnosis present

## 2020-02-01 DIAGNOSIS — Z7901 Long term (current) use of anticoagulants: Secondary | ICD-10-CM

## 2020-02-01 DIAGNOSIS — Z7951 Long term (current) use of inhaled steroids: Secondary | ICD-10-CM

## 2020-02-01 DIAGNOSIS — I10 Essential (primary) hypertension: Secondary | ICD-10-CM | POA: Diagnosis present

## 2020-02-01 DIAGNOSIS — Z880 Allergy status to penicillin: Secondary | ICD-10-CM

## 2020-02-01 DIAGNOSIS — Z79899 Other long term (current) drug therapy: Secondary | ICD-10-CM

## 2020-02-01 DIAGNOSIS — Z7983 Long term (current) use of bisphosphonates: Secondary | ICD-10-CM

## 2020-02-01 DIAGNOSIS — N390 Urinary tract infection, site not specified: Secondary | ICD-10-CM

## 2020-02-01 DIAGNOSIS — E039 Hypothyroidism, unspecified: Secondary | ICD-10-CM | POA: Diagnosis present

## 2020-02-01 DIAGNOSIS — A419 Sepsis, unspecified organism: Secondary | ICD-10-CM

## 2020-02-01 DIAGNOSIS — Z79890 Hormone replacement therapy (postmenopausal): Secondary | ICD-10-CM

## 2020-02-01 HISTORY — DX: Hypothyroidism, unspecified: E03.9

## 2020-02-01 LAB — CBC WITH DIFFERENTIAL/PLATELET
Abs Immature Granulocytes: 0.08 10*3/uL — ABNORMAL HIGH (ref 0.00–0.07)
Basophils Absolute: 0.1 10*3/uL (ref 0.0–0.1)
Basophils Relative: 0 %
Eosinophils Absolute: 0.4 10*3/uL (ref 0.0–0.5)
Eosinophils Relative: 2 %
HCT: 42.7 % (ref 36.0–46.0)
Hemoglobin: 13.7 g/dL (ref 12.0–15.0)
Immature Granulocytes: 1 %
Lymphocytes Relative: 7 %
Lymphs Abs: 1.2 10*3/uL (ref 0.7–4.0)
MCH: 32 pg (ref 26.0–34.0)
MCHC: 32.1 g/dL (ref 30.0–36.0)
MCV: 99.8 fL (ref 80.0–100.0)
Monocytes Absolute: 1.1 10*3/uL — ABNORMAL HIGH (ref 0.1–1.0)
Monocytes Relative: 6 %
Neutro Abs: 14.6 10*3/uL — ABNORMAL HIGH (ref 1.7–7.7)
Neutrophils Relative %: 84 %
Platelets: 264 10*3/uL (ref 150–400)
RBC: 4.28 MIL/uL (ref 3.87–5.11)
RDW: 12.8 % (ref 11.5–15.5)
WBC: 17.5 10*3/uL — ABNORMAL HIGH (ref 4.0–10.5)
nRBC: 0 % (ref 0.0–0.2)

## 2020-02-01 LAB — URINALYSIS, ROUTINE W REFLEX MICROSCOPIC
Bilirubin Urine: NEGATIVE
Glucose, UA: NEGATIVE mg/dL
Hgb urine dipstick: NEGATIVE
Ketones, ur: 20 mg/dL — AB
Nitrite: NEGATIVE
Protein, ur: NEGATIVE mg/dL
Specific Gravity, Urine: 1.017 (ref 1.005–1.030)
pH: 6 (ref 5.0–8.0)

## 2020-02-01 LAB — COMPREHENSIVE METABOLIC PANEL
ALT: 19 U/L (ref 0–44)
AST: 19 U/L (ref 15–41)
Albumin: 3.7 g/dL (ref 3.5–5.0)
Alkaline Phosphatase: 45 U/L (ref 38–126)
Anion gap: 8 (ref 5–15)
BUN: 15 mg/dL (ref 8–23)
CO2: 26 mmol/L (ref 22–32)
Calcium: 9.2 mg/dL (ref 8.9–10.3)
Chloride: 106 mmol/L (ref 98–111)
Creatinine, Ser: 0.75 mg/dL (ref 0.44–1.00)
GFR, Estimated: >60 mL/min (ref >60)
Glucose, Bld: 130 mg/dL — ABNORMAL HIGH (ref 70–99)
Potassium: 3.9 mmol/L (ref 3.5–5.1)
Sodium: 140 mmol/L (ref 135–145)
Total Bilirubin: 0.9 mg/dL (ref 0.3–1.2)
Total Protein: 6.6 g/dL (ref 6.5–8.1)

## 2020-02-01 LAB — POC SARS CORONAVIRUS 2 AG -  ED: SARS Coronavirus 2 Ag: NEGATIVE

## 2020-02-01 LAB — PROTIME-INR
INR: 1.3 — ABNORMAL HIGH (ref 0.8–1.2)
Prothrombin Time: 15.8 seconds — ABNORMAL HIGH (ref 11.4–15.2)

## 2020-02-01 LAB — BLOOD GAS, VENOUS
Acid-Base Excess: 0.1 mmol/L (ref 0.0–2.0)
Bicarbonate: 26.9 mmol/L (ref 20.0–28.0)
O2 Saturation: 58.6 %
Patient temperature: 98.6
pCO2, Ven: 54.2 mmHg (ref 44.0–60.0)
pH, Ven: 7.316 (ref 7.250–7.430)
pO2, Ven: 37.9 mmHg (ref 32.0–45.0)

## 2020-02-01 LAB — LACTIC ACID, PLASMA: Lactic Acid, Venous: 1.3 mmol/L (ref 0.5–1.9)

## 2020-02-01 LAB — APTT: aPTT: 32 seconds (ref 24–36)

## 2020-02-01 MED ORDER — LACTATED RINGERS IV BOLUS (SEPSIS)
500.0000 mL | Freq: Once | INTRAVENOUS | Status: AC
  Administered 2020-02-01: 500 mL via INTRAVENOUS

## 2020-02-01 MED ORDER — VANCOMYCIN HCL IN DEXTROSE 1-5 GM/200ML-% IV SOLN
1000.0000 mg | Freq: Once | INTRAVENOUS | Status: AC
  Administered 2020-02-01: 1000 mg via INTRAVENOUS
  Filled 2020-02-01: qty 200

## 2020-02-01 MED ORDER — LACTATED RINGERS IV SOLN: INTRAVENOUS | Status: DC

## 2020-02-01 MED ORDER — IPRATROPIUM BROMIDE 0.02 % IN SOLN
0.5000 mg | Freq: Once | RESPIRATORY_TRACT | Status: AC
  Administered 2020-02-01: 0.5 mg via RESPIRATORY_TRACT
  Filled 2020-02-01: qty 2.5

## 2020-02-01 MED ORDER — SODIUM CHLORIDE 0.9 % IV SOLN
2.0000 g | Freq: Once | INTRAVENOUS | Status: AC
  Administered 2020-02-01: 2 g via INTRAVENOUS
  Filled 2020-02-01: qty 2

## 2020-02-01 MED ORDER — ALBUTEROL SULFATE (2.5 MG/3ML) 0.083% IN NEBU
5.0000 mg | INHALATION_SOLUTION | Freq: Once | RESPIRATORY_TRACT | Status: AC
  Administered 2020-02-01: 5 mg via RESPIRATORY_TRACT
  Filled 2020-02-01: qty 6

## 2020-02-01 NOTE — Progress Notes (Signed)
A consult was received from an ED physician for Vancomycin and Cefepime per pharmacy dosing.  The patient's profile has been reviewed for ht/wt/allergies/indication/available labs.    A one time order has been placed for Vancomycin 1gm and Cefepime 2gm IV.    Further antibiotics/pharmacy consults should be ordered by admitting physician if indicated.                       Thank you, Maryellen Pile, PharmD 02/01/2020  10:49 PM

## 2020-02-01 NOTE — ED Provider Notes (Incomplete)
[facility]-EMERGENCY DEPT Provider Note   CSN: [id number] Arrival date & time: 02/01/20  2158     History Chief Complaint  Patient presents with  . Code Sepsis    Jaclyn Weaver is a 87 y.o. female.  Patient from [facility], history of dementia reported to be at baseline currently, COPD not O2 dependent, HTN, HLD, presents with fever onset this afternoon, decreased oxygen saturations. Per report, she is currently being treated for UTI, on Keflex since evaluation after fall (12/01).   The history is provided by the patient. No language interpreter was used.       Past Medical History:  Diagnosis Date  . Constipation    from facility paperwork  . COPD (chronic obstructive pulmonary disease) (HCC)   . Dementia (HCC)    from facility paperwork  . Dermatitis    from facility paperwork  . Hyperlipidemia    from facility paperwork  . Hypertension   . Osteoporosis    from facility paperwork  . Urinary incontinence    from facility paperwork    There are no problems to display for this patient.   History reviewed. No pertinent surgical history.   OB History   No obstetric history on file.     No family history on file.     Home Medications Prior to Admission medications   Medication Sig Start Date End Date Taking? Authorizing Provider  acetaminophen (TYLENOL) 325 MG tablet Take 650 mg by mouth every 4 (four) hours as needed for mild pain or fever.    [provider]  acetaminophen (TYLENOL) 325 MG tablet Take 650 mg by mouth every 4 (four) hours as needed (for pain or a temperature greater than 100 F).    [provider]  alendronate-cholecalciferol (FOSAMAX PLUS D) 70-2800 MG-UNIT tablet Take 1 tablet by mouth every 7 (seven) days. Take with a full glass of water on an empty stomach.    [provider]  alendronate-cholecalciferol (FOSAMAX PLUS D) 70-2800 MG-UNIT tablet Take 1 tablet by mouth every Sunday. Take  with a full glass of water on an empty stomach.    [provider]  apixaban (ELIQUIS) 5 MG TABS tablet Take 5 mg by mouth 2 (two) times daily.    [provider]  apixaban (ELIQUIS) 5 MG TABS tablet Take 5 mg by mouth 2 (two) times daily.    [provider]  bisacodyl (DULCOLAX) 10 MG suppository Place 10 mg rectally daily as needed for moderate constipation.    [provider]  bisacodyl (DULCOLAX) 10 MG suppository Place 10 mg rectally daily as needed (for constipation- "if no BM by the 4th day").    [provider]  Calcium Carbonate-Vitamin D 600-400 MG-UNIT tablet Take 1 tablet by mouth daily.    [provider]  Calcium Carbonate-Vitamin D3 (CALCIUM 600-D) 600-400 MG-UNIT TABS Take 1 tablet by mouth daily.    [provider]  cephALEXin (KEFLEX) 500 MG capsule Take 1 capsule (500 mg total) by mouth 4 (four) times daily. 01/27/20   Margarita Grizzle, MD  Cranberry 450 MG TABS Take 450 tablets by mouth daily.    [provider]  doxycycline (ADOXA) 100 MG tablet Take 100 mg by mouth 2 (two) times daily. 10/20/19   [provider]  fluticasone-salmeterol (ADVAIR HFA) 45-21 MCG/ACT inhaler Inhale 1 puff into the lungs 2 (two) times daily.    [provider]  fluticasone-salmeterol (ADVAIR HFA) 45-21 MCG/ACT inhaler Inhale 1 puff  into the lungs 2 (two) times daily.    [provider]  ipratropium (ATROVENT) 0.06 % nasal spray Place 2 sprays into both nostrils in the morning and at bedtime.    [provider]  ipratropium (ATROVENT) 0.06 % nasal spray Place 2 sprays into both nostrils 3 (three) times daily before meals.    [provider]  levothyroxine (SYNTHROID) 75 MCG tablet Take 75 mcg by mouth daily before breakfast.    [provider]  levothyroxine (SYNTHROID) 75 MCG tablet Take 75 mcg by mouth daily before breakfast.    [provider]  magnesium hydroxide (MILK OF  MAGNESIA) 400 MG/5ML suspension Take 30 mLs by mouth daily as needed for mild constipation (if no BM in 3 days).    [provider]  magnesium hydroxide (MILK OF MAGNESIA) 400 MG/5ML suspension Take 30 mLs by mouth daily as needed (for constipation- "if no BM in 3 days").    [provider]  Multiple Vitamins-Minerals (MULTIVITAMIN WITH MINERALS) tablet Take 1 tablet by mouth daily.    [provider]  Multiple Vitamins-Minerals (ONE-A-DAY PROACTIVE 65+) TABS Take 1 tablet by mouth daily with breakfast.    [provider]  predniSONE (DELTASONE) 5 MG tablet Take 5 mg by mouth daily with breakfast.    [provider]  predniSONE (DELTASONE) 5 MG tablet Take 5 mg by mouth daily with breakfast.    [provider]  senna (SENOKOT) 8.6 MG TABS tablet Take 1 tablet by mouth daily.    [provider]  senna (SENOKOT) 8.6 MG TABS tablet Take 1 tablet by mouth in the morning.    [provider]  simvastatin (ZOCOR) 10 MG tablet Take 10 mg by mouth every other day.    [provider]  simvastatin (ZOCOR) 10 MG tablet Take 10 mg by mouth every other day.    [provider]  tamsulosin (FLOMAX) 0.4 MG CAPS capsule Take 0.4 mg by mouth at bedtime.    [provider]  tamsulosin (FLOMAX) 0.4 MG CAPS capsule Take 0.4 mg by mouth at bedtime.    [provider]    Allergies    Penicillin g and Penicillins  Review of Systems   Review of Systems  Unable to perform ROS: Dementia    Physical Exam Updated Vital Signs BP (!) 182/107   Pulse (!) 132   Temp (!) 100.8 F (38.2 C) (Rectal)   Resp (!) 34   SpO2 100%   Physical Exam Vitals and nursing note reviewed.  Constitutional:      General: She is not in acute distress.    Appearance: She is well-developed and well-nourished. She is ill-appearing.  HENT:     Head: Normocephalic.     Comments: Aged bruise to left temporal region.     Mouth/Throat:     Mouth: Mucous membranes are dry.  Eyes:     Conjunctiva/sclera: Conjunctivae normal.  Cardiovascular:     Rate and Rhythm: Regular rhythm. Tachycardia present.  Pulmonary:     Effort: Pulmonary effort is normal.     Breath sounds: Wheezing and rhonchi present.     Comments: Decreased air movement bilaterally. Abdominal:     General: Bowel sounds are normal. There is no distension.     Palpations: Abdomen is soft.     Tenderness: There is no abdominal tenderness. There is no guarding or rebound.  Musculoskeletal:        General: Normal range of motion.  Cervical back: Normal range of motion and neck supple.  Skin:    General: Skin is warm and dry.     Findings: No rash.     Comments: Small abrasion left elbow without surrounding redness. No active drainage. No swelling. Not apparently tender.  Neurological:     Comments: Non-verbal, withdraws from pain. Responds to verbal stimuli.  Psychiatric:        Mood and Affect: Mood and affect normal.     ED Results / Procedures / Treatments   Labs (all labs ordered are listed, but only abnormal results are displayed) Labs Reviewed  CULTURE, BLOOD (ROUTINE X 2)  CULTURE, BLOOD (ROUTINE X 2)  URINE CULTURE  LACTIC ACID, PLASMA  LACTIC ACID, PLASMA  COMPREHENSIVE METABOLIC PANEL  CBC WITH DIFFERENTIAL/PLATELET  URINALYSIS, ROUTINE W REFLEX MICROSCOPIC  PROTIME-INR  APTT  POC SARS CORONAVIRUS 2 AG -  ED    EKG None  Radiology No results found.  Procedures Procedures (including critical care time)  Medications Ordered in ED Medications  lactated ringers infusion (has no administration in time range)  lactated ringers bolus 500 mL (has no administration in time range)  vancomycin (VANCOCIN) IVPB 1000 mg/200 mL premix (has no administration in time range)  ceFEPIme (MAXIPIME) 2 g in sodium chloride 0.9 % 100 mL IVPB (has no administration in time range)    ED Course  I have reviewed the triage vital  signs and the nursing notes.  Pertinent labs & imaging results that were available during my care of the patient were reviewed by me and considered in my medical decision making (see chart for details).    MDM Rules/Calculators/A&P                          *** Final Clinical Impression(s) / ED Diagnoses Final diagnoses:  None    Rx / DC Orders ED Discharge Orders    None

## 2020-02-01 NOTE — ED Triage Notes (Signed)
Patient arrived from [facility]. Patient on 4L O2, normally RA at baseline. EMS reports patient seen for a fall on 01/27/20 and dressing has not been changed, noticed some drainage. Patient also currently being treated on antibiotics for UTI. Non verbal at baseline. 500cc fluids given prior to arrival.

## 2020-02-01 NOTE — ED Provider Notes (Incomplete)
[facility]-EMERGENCY DEPT Provider Note   CSN: [id number] Arrival date & time: 02/01/20  2158     History Chief Complaint  Patient presents with  . Code Sepsis    Belkis Norbeck is a 87 y.o. female.  Patient from [facility], history of dementia reported to be at baseline currently, COPD not O2 dependent, HTN, HLD, presents with fever onset this afternoon, decreased oxygen saturations. Per report, she is currently being treated for UTI, on Keflex since evaluation after fall (12/01).   The history is provided by the patient. No language interpreter was used.       Past Medical History:  Diagnosis Date  . Constipation    from facility paperwork  . COPD (chronic obstructive pulmonary disease) (HCC)   . Dementia (HCC)    from facility paperwork  . Dermatitis    from facility paperwork  . Hyperlipidemia    from facility paperwork  . Hypertension   . Osteoporosis    from facility paperwork  . Urinary incontinence    from facility paperwork    There are no problems to display for this patient.   History reviewed. No pertinent surgical history.   OB History   No obstetric history on file.     No family history on file.     Home Medications Prior to Admission medications   Medication Sig Start Date End Date Taking? Authorizing Provider  acetaminophen (TYLENOL) 325 MG tablet Take 650 mg by mouth every 4 (four) hours as needed for mild pain or fever.    [provider]  acetaminophen (TYLENOL) 325 MG tablet Take 650 mg by mouth every 4 (four) hours as needed (for pain or a temperature greater than 100 F).    [provider]  alendronate-cholecalciferol (FOSAMAX PLUS D) 70-2800 MG-UNIT tablet Take 1 tablet by mouth every 7 (seven) days. Take with a full glass of water on an empty stomach.    [provider]  alendronate-cholecalciferol (FOSAMAX PLUS D) 70-2800 MG-UNIT tablet Take 1 tablet by mouth every Sunday. Take  with a full glass of water on an empty stomach.    [provider]  apixaban (ELIQUIS) 5 MG TABS tablet Take 5 mg by mouth 2 (two) times daily.    [provider]  apixaban (ELIQUIS) 5 MG TABS tablet Take 5 mg by mouth 2 (two) times daily.    [provider]  bisacodyl (DULCOLAX) 10 MG suppository Place 10 mg rectally daily as needed for moderate constipation.    [provider]  bisacodyl (DULCOLAX) 10 MG suppository Place 10 mg rectally daily as needed (for constipation- "if no BM by the 4th day").    [provider]  Calcium Carbonate-Vitamin D 600-400 MG-UNIT tablet Take 1 tablet by mouth daily.    [provider]  Calcium Carbonate-Vitamin D3 (CALCIUM 600-D) 600-400 MG-UNIT TABS Take 1 tablet by mouth daily.    [provider]  cephALEXin (KEFLEX) 500 MG capsule Take 1 capsule (500 mg total) by mouth 4 (four) times daily. 01/27/20   Margarita Grizzle, MD  Cranberry 450 MG TABS Take 450 tablets by mouth daily.    [provider]  doxycycline (ADOXA) 100 MG tablet Take 100 mg by mouth 2 (two) times daily. 10/20/19   [provider]  fluticasone-salmeterol (ADVAIR HFA) 45-21 MCG/ACT inhaler Inhale 1 puff into the lungs 2 (two) times daily.    [provider]  fluticasone-salmeterol (ADVAIR HFA) 45-21 MCG/ACT inhaler Inhale 1 puff  into the lungs 2 (two) times daily.    [provider]  ipratropium (ATROVENT) 0.06 % nasal spray Place 2 sprays into both nostrils in the morning and at bedtime.    [provider]  ipratropium (ATROVENT) 0.06 % nasal spray Place 2 sprays into both nostrils 3 (three) times daily before meals.    [provider]  levothyroxine (SYNTHROID) 75 MCG tablet Take 75 mcg by mouth daily before breakfast.    [provider]  levothyroxine (SYNTHROID) 75 MCG tablet Take 75 mcg by mouth daily before breakfast.    [provider]  magnesium hydroxide (MILK OF  MAGNESIA) 400 MG/5ML suspension Take 30 mLs by mouth daily as needed for mild constipation (if no BM in 3 days).    [provider]  magnesium hydroxide (MILK OF MAGNESIA) 400 MG/5ML suspension Take 30 mLs by mouth daily as needed (for constipation- "if no BM in 3 days").    [provider]  Multiple Vitamins-Minerals (MULTIVITAMIN WITH MINERALS) tablet Take 1 tablet by mouth daily.    [provider]  Multiple Vitamins-Minerals (ONE-A-DAY PROACTIVE 65+) TABS Take 1 tablet by mouth daily with breakfast.    [provider]  predniSONE (DELTASONE) 5 MG tablet Take 5 mg by mouth daily with breakfast.    [provider]  predniSONE (DELTASONE) 5 MG tablet Take 5 mg by mouth daily with breakfast.    [provider]  senna (SENOKOT) 8.6 MG TABS tablet Take 1 tablet by mouth daily.    [provider]  senna (SENOKOT) 8.6 MG TABS tablet Take 1 tablet by mouth in the morning.    [provider]  simvastatin (ZOCOR) 10 MG tablet Take 10 mg by mouth every other day.    [provider]  simvastatin (ZOCOR) 10 MG tablet Take 10 mg by mouth every other day.    [provider]  tamsulosin (FLOMAX) 0.4 MG CAPS capsule Take 0.4 mg by mouth at bedtime.    [provider]  tamsulosin (FLOMAX) 0.4 MG CAPS capsule Take 0.4 mg by mouth at bedtime.    [provider]    Allergies    Penicillin g and Penicillins  Review of Systems   Review of Systems  Unable to perform ROS: Dementia    Physical Exam Updated Vital Signs BP (!) 182/107   Pulse (!) 132   Temp (!) 100.8 F (38.2 C) (Rectal)   Resp (!) 34   SpO2 100%   Physical Exam Vitals and nursing note reviewed.  Constitutional:      General: She is not in acute distress.    Appearance: She is well-developed and well-nourished. She is ill-appearing.  HENT:     Head: Normocephalic.     Comments: Aged bruise to left temporal region.     Mouth/Throat:     Mouth: Mucous membranes are dry.  Eyes:     Conjunctiva/sclera: Conjunctivae normal.  Cardiovascular:     Rate and Rhythm: Regular rhythm. Tachycardia present.  Pulmonary:     Effort: Pulmonary effort is normal.     Breath sounds: Wheezing and rhonchi present.     Comments: Decreased air movement bilaterally. Abdominal:     General: Bowel sounds are normal. There is no distension.     Palpations: Abdomen is soft.     Tenderness: There is no abdominal tenderness. There is no guarding or rebound.  Musculoskeletal:        General: Normal range of motion.  Cervical back: Normal range of motion and neck supple.  Skin:    General: Skin is warm and dry.     Findings: No rash.     Comments: Small abrasion left elbow without surrounding redness. No active drainage. No swelling. Not apparently tender.  Neurological:     Comments: Non-verbal, withdraws from pain. Responds to verbal stimuli.  Psychiatric:        Mood and Affect: Mood and affect normal.     ED Results / Procedures / Treatments   Labs (all labs ordered are listed, but only abnormal results are displayed) Labs Reviewed  CULTURE, BLOOD (ROUTINE X 2)  CULTURE, BLOOD (ROUTINE X 2)  URINE CULTURE  LACTIC ACID, PLASMA  LACTIC ACID, PLASMA  COMPREHENSIVE METABOLIC PANEL  CBC WITH DIFFERENTIAL/PLATELET  URINALYSIS, ROUTINE W REFLEX MICROSCOPIC  PROTIME-INR  APTT  POC SARS CORONAVIRUS 2 AG -  ED    EKG None  Radiology No results found.  Procedures Procedures (including critical care time)  Medications Ordered in ED Medications  lactated ringers infusion (has no administration in time range)  lactated ringers bolus 500 mL (has no administration in time range)  vancomycin (VANCOCIN) IVPB 1000 mg/200 mL premix (has no administration in time range)  ceFEPIme (MAXIPIME) 2 g in sodium chloride 0.9 % 100 mL IVPB (has no administration in time range)    ED Course  I have reviewed the triage vital  signs and the nursing notes.  Pertinent labs & imaging results that were available during my care of the patient were reviewed by me and considered in my medical decision making (see chart for details).    MDM Rules/Calculators/A&P                          Patient to ED from nursing home concerned for sepsis, reporting fever earlier today. REcent UTI, on Keflex. Dementia reported at baseline mental status.   Low grade temp on arrival 100.8. No reported antipyretics prior to arrival.  Final Clinical Impression(s) / ED Diagnoses Final diagnoses:  None    Rx / DC Orders ED Discharge Orders    None

## 2020-02-02 ENCOUNTER — Encounter (HOSPITAL_COMMUNITY): Payer: Self-pay

## 2020-02-02 ENCOUNTER — Encounter (HOSPITAL_COMMUNITY)
Admission: EM | Disposition: A | Discharge status: Skilled Nursing Facility | Payer: Self-pay | Source: Home / Self Care | Attending: Internal Medicine

## 2020-02-02 ENCOUNTER — Emergency Department (HOSPITAL_COMMUNITY): Payer: Medicare Other | Admitting: Registered Nurse

## 2020-02-02 ENCOUNTER — Inpatient Hospital Stay (HOSPITAL_COMMUNITY): Payer: Medicare Other

## 2020-02-02 DIAGNOSIS — Z7901 Long term (current) use of anticoagulants: Secondary | ICD-10-CM | POA: Diagnosis not present

## 2020-02-02 DIAGNOSIS — Z66 Do not resuscitate: Secondary | ICD-10-CM | POA: Diagnosis present

## 2020-02-02 DIAGNOSIS — A419 Sepsis, unspecified organism: Secondary | ICD-10-CM | POA: Diagnosis not present

## 2020-02-02 DIAGNOSIS — Z7951 Long term (current) use of inhaled steroids: Secondary | ICD-10-CM | POA: Diagnosis not present

## 2020-02-02 DIAGNOSIS — D72829 Elevated white blood cell count, unspecified: Secondary | ICD-10-CM | POA: Insufficient documentation

## 2020-02-02 DIAGNOSIS — N390 Urinary tract infection, site not specified: Secondary | ICD-10-CM

## 2020-02-02 DIAGNOSIS — Z20822 Contact with and (suspected) exposure to covid-19: Secondary | ICD-10-CM | POA: Diagnosis present

## 2020-02-02 DIAGNOSIS — N201 Calculus of ureter: Secondary | ICD-10-CM | POA: Diagnosis present

## 2020-02-02 DIAGNOSIS — N136 Pyonephrosis: Secondary | ICD-10-CM | POA: Diagnosis present

## 2020-02-02 DIAGNOSIS — G309 Alzheimer's disease, unspecified: Secondary | ICD-10-CM | POA: Diagnosis not present

## 2020-02-02 DIAGNOSIS — F0390 Unspecified dementia without behavioral disturbance: Secondary | ICD-10-CM

## 2020-02-02 DIAGNOSIS — I10 Essential (primary) hypertension: Secondary | ICD-10-CM | POA: Diagnosis present

## 2020-02-02 DIAGNOSIS — Z79890 Hormone replacement therapy (postmenopausal): Secondary | ICD-10-CM | POA: Diagnosis not present

## 2020-02-02 DIAGNOSIS — M7989 Other specified soft tissue disorders: Secondary | ICD-10-CM | POA: Diagnosis not present

## 2020-02-02 DIAGNOSIS — M810 Age-related osteoporosis without current pathological fracture: Secondary | ICD-10-CM | POA: Diagnosis present

## 2020-02-02 DIAGNOSIS — J449 Chronic obstructive pulmonary disease, unspecified: Secondary | ICD-10-CM

## 2020-02-02 DIAGNOSIS — J45901 Unspecified asthma with (acute) exacerbation: Secondary | ICD-10-CM | POA: Diagnosis present

## 2020-02-02 DIAGNOSIS — Z880 Allergy status to penicillin: Secondary | ICD-10-CM | POA: Diagnosis not present

## 2020-02-02 DIAGNOSIS — N132 Hydronephrosis with renal and ureteral calculous obstruction: Secondary | ICD-10-CM

## 2020-02-02 DIAGNOSIS — E039 Hypothyroidism, unspecified: Secondary | ICD-10-CM | POA: Diagnosis present

## 2020-02-02 DIAGNOSIS — E785 Hyperlipidemia, unspecified: Secondary | ICD-10-CM | POA: Diagnosis present

## 2020-02-02 DIAGNOSIS — A4152 Sepsis due to Pseudomonas: Secondary | ICD-10-CM | POA: Diagnosis present

## 2020-02-02 DIAGNOSIS — Z79899 Other long term (current) drug therapy: Secondary | ICD-10-CM | POA: Diagnosis not present

## 2020-02-02 DIAGNOSIS — Z7983 Long term (current) use of bisphosphonates: Secondary | ICD-10-CM | POA: Diagnosis not present

## 2020-02-02 HISTORY — PX: CYSTOSCOPY W/ URETERAL STENT PLACEMENT: SHX1429

## 2020-02-02 LAB — BASIC METABOLIC PANEL
Anion gap: 8 (ref 5–15)
BUN: 12 mg/dL (ref 8–23)
CO2: 24 mmol/L (ref 22–32)
Calcium: 9 mg/dL (ref 8.9–10.3)
Chloride: 108 mmol/L (ref 98–111)
Creatinine, Ser: 0.65 mg/dL (ref 0.44–1.00)
GFR, Estimated: >60 mL/min (ref >60)
Glucose, Bld: 162 mg/dL — ABNORMAL HIGH (ref 70–99)
Potassium: 3.8 mmol/L (ref 3.5–5.1)
Sodium: 140 mmol/L (ref 135–145)

## 2020-02-02 LAB — CBC
HCT: 38.5 % (ref 36.0–46.0)
Hemoglobin: 12.6 g/dL (ref 12.0–15.0)
MCH: 32.6 pg (ref 26.0–34.0)
MCHC: 32.7 g/dL (ref 30.0–36.0)
MCV: 99.5 fL (ref 80.0–100.0)
Platelets: 208 10*3/uL (ref 150–400)
RBC: 3.87 MIL/uL (ref 3.87–5.11)
RDW: 12.7 % (ref 11.5–15.5)
WBC: 13 10*3/uL — ABNORMAL HIGH (ref 4.0–10.5)
nRBC: 0 % (ref 0.0–0.2)

## 2020-02-02 LAB — PROTIME-INR
INR: 1.3 — ABNORMAL HIGH (ref 0.8–1.2)
Prothrombin Time: 16 seconds — ABNORMAL HIGH (ref 11.4–15.2)

## 2020-02-02 LAB — PROCALCITONIN: Procalcitonin: <0.1 ng/mL

## 2020-02-02 LAB — HEMOGLOBIN AND HEMATOCRIT, BLOOD
HCT: 37.3 % (ref 36.0–46.0)
Hemoglobin: 12.1 g/dL (ref 12.0–15.0)

## 2020-02-02 LAB — RESP PANEL BY RT-PCR (FLU A&B, COVID) ARPGX2
Influenza A by PCR: NEGATIVE
Influenza B by PCR: NEGATIVE
SARS Coronavirus 2 by RT PCR: NEGATIVE

## 2020-02-02 LAB — LACTIC ACID, PLASMA: Lactic Acid, Venous: 1.1 mmol/L (ref 0.5–1.9)

## 2020-02-02 SURGERY — CYSTOSCOPY, WITH RETROGRADE PYELOGRAM AND URETERAL STENT INSERTION
Anesthesia: General | Site: Ureter | Laterality: Left

## 2020-02-02 MED ORDER — ONDANSETRON HCL 4 MG/2ML IJ SOLN
INTRAMUSCULAR | Status: DC | PRN
  Administered 2020-02-02: 4 mg via INTRAVENOUS

## 2020-02-02 MED ORDER — ALBUMIN HUMAN 5 % IV SOLN: INTRAVENOUS | Status: DC | PRN

## 2020-02-02 MED ORDER — MORPHINE SULFATE (PF) 2 MG/ML IV SOLN
2.0000 mg | INTRAVENOUS | Status: DC | PRN
  Administered 2020-02-02: 2 mg via INTRAVENOUS
  Filled 2020-02-02 (×2): qty 1

## 2020-02-02 MED ORDER — SODIUM CHLORIDE 0.9 % IV SOLN: 2.0000 g | Freq: Two times a day (BID) | INTRAVENOUS | Status: DC

## 2020-02-02 MED ORDER — LACTATED RINGERS IV SOLN: INTRAVENOUS | Status: DC

## 2020-02-02 MED ORDER — SUCCINYLCHOLINE CHLORIDE 20 MG/ML IJ SOLN
INTRAMUSCULAR | Status: DC | PRN
  Administered 2020-02-02: 60 mg via INTRAVENOUS

## 2020-02-02 MED ORDER — PHENYLEPHRINE 40 MCG/ML (10ML) SYRINGE FOR IV PUSH (FOR BLOOD PRESSURE SUPPORT)
PREFILLED_SYRINGE | INTRAVENOUS | Status: AC
  Filled 2020-02-02: qty 10

## 2020-02-02 MED ORDER — LIDOCAINE HCL (CARDIAC) PF 50 MG/5ML IV SOSY
PREFILLED_SYRINGE | INTRAVENOUS | Status: DC | PRN
  Administered 2020-02-02: 60 mg via INTRAVENOUS

## 2020-02-02 MED ORDER — DEXAMETHASONE SODIUM PHOSPHATE 10 MG/ML IJ SOLN
10.0000 mg | Freq: Once | INTRAMUSCULAR | Status: AC
  Administered 2020-02-02: 10 mg via INTRAVENOUS
  Filled 2020-02-02: qty 1

## 2020-02-02 MED ORDER — TAMSULOSIN HCL 0.4 MG PO CAPS
0.4000 mg | ORAL_CAPSULE | Freq: Every day | ORAL | Status: DC
  Administered 2020-02-02 – 2020-02-05 (×4): 0.4 mg via ORAL
  Filled 2020-02-02 (×4): qty 1

## 2020-02-02 MED ORDER — ACETAMINOPHEN 10 MG/ML IV SOLN
INTRAVENOUS | Status: AC
  Filled 2020-02-02: qty 100

## 2020-02-02 MED ORDER — PROPOFOL 10 MG/ML IV BOLUS
INTRAVENOUS | Status: DC | PRN
Start: 2020-02-02 — End: 2020-02-02
  Administered 2020-02-02: 60 mg via INTRAVENOUS

## 2020-02-02 MED ORDER — LEVOTHYROXINE SODIUM 75 MCG PO TABS
75.0000 ug | ORAL_TABLET | Freq: Every day | ORAL | Status: DC
  Administered 2020-02-03 – 2020-02-06 (×4): 75 ug via ORAL
  Filled 2020-02-02 (×5): qty 1

## 2020-02-02 MED ORDER — DEXAMETHASONE SODIUM PHOSPHATE 10 MG/ML IJ SOLN
INTRAMUSCULAR | Status: DC | PRN
  Administered 2020-02-02: 5 mg via INTRAVENOUS

## 2020-02-02 MED ORDER — ORAL CARE MOUTH RINSE
15.0000 mL | Freq: Two times a day (BID) | OROMUCOSAL | Status: DC
  Administered 2020-02-02 – 2020-02-06 (×6): 15 mL via OROMUCOSAL

## 2020-02-02 MED ORDER — SENNOSIDES-DOCUSATE SODIUM 8.6-50 MG PO TABS
1.0000 | ORAL_TABLET | Freq: Every evening | ORAL | Status: DC | PRN
  Filled 2020-02-02: qty 1

## 2020-02-02 MED ORDER — FENTANYL CITRATE (PF) 100 MCG/2ML IJ SOLN
INTRAMUSCULAR | Status: DC | PRN
  Administered 2020-02-02: 25 ug via INTRAVENOUS

## 2020-02-02 MED ORDER — LINEZOLID 600 MG/300ML IV SOLN
600.0000 mg | Freq: Two times a day (BID) | INTRAVENOUS | Status: DC
  Administered 2020-02-03 – 2020-02-04 (×5): 600 mg via INTRAVENOUS
  Filled 2020-02-02 (×6): qty 300

## 2020-02-02 MED ORDER — APIXABAN 5 MG PO TABS
5.0000 mg | ORAL_TABLET | Freq: Two times a day (BID) | ORAL | Status: DC
  Administered 2020-02-04 – 2020-02-06 (×5): 5 mg via ORAL
  Filled 2020-02-02 (×2): qty 1
  Filled 2020-02-02: qty 2
  Filled 2020-02-02 (×3): qty 1

## 2020-02-02 MED ORDER — SODIUM CHLORIDE 0.9 % IR SOLN
Status: DC | PRN
  Administered 2020-02-02: 3000 mL

## 2020-02-02 MED ORDER — ONDANSETRON HCL 4 MG/2ML IJ SOLN
INTRAMUSCULAR | Status: AC
  Filled 2020-02-02: qty 2

## 2020-02-02 MED ORDER — ACETAMINOPHEN 10 MG/ML IV SOLN
INTRAVENOUS | Status: DC | PRN
  Administered 2020-02-02: 1000 mg via INTRAVENOUS

## 2020-02-02 MED ORDER — MOMETASONE FURO-FORMOTEROL FUM 100-5 MCG/ACT IN AERO
2.0000 | INHALATION_SPRAY | Freq: Two times a day (BID) | RESPIRATORY_TRACT | Status: DC
  Administered 2020-02-02 – 2020-02-06 (×8): 2 via RESPIRATORY_TRACT
  Filled 2020-02-02: qty 8.8

## 2020-02-02 MED ORDER — ONDANSETRON HCL 4 MG PO TABS
4.0000 mg | ORAL_TABLET | Freq: Four times a day (QID) | ORAL | Status: DC | PRN
  Filled 2020-02-02: qty 1

## 2020-02-02 MED ORDER — MELATONIN 3 MG PO TABS
3.0000 mg | ORAL_TABLET | Freq: Every day | ORAL | Status: DC
Start: 2020-02-02 — End: 2020-02-06
  Administered 2020-02-03 – 2020-02-05 (×4): 3 mg via ORAL
  Filled 2020-02-02 (×4): qty 1

## 2020-02-02 MED ORDER — ACETAMINOPHEN 325 MG PO TABS: 650.0000 mg | ORAL_TABLET | Freq: Four times a day (QID) | ORAL | Status: DC | PRN

## 2020-02-02 MED ORDER — LIDOCAINE HCL (PF) 2 % IJ SOLN
INTRAMUSCULAR | Status: AC
  Filled 2020-02-02: qty 5

## 2020-02-02 MED ORDER — FENTANYL CITRATE (PF) 100 MCG/2ML IJ SOLN
INTRAMUSCULAR | Status: AC
  Filled 2020-02-02: qty 2

## 2020-02-02 MED ORDER — SIMVASTATIN 20 MG PO TABS
10.0000 mg | ORAL_TABLET | ORAL | Status: DC
Start: 2020-02-03 — End: 2020-02-06
  Administered 2020-02-03 – 2020-02-05 (×2): 10 mg via ORAL
  Filled 2020-02-02 (×3): qty 1

## 2020-02-02 MED ORDER — DEXAMETHASONE SODIUM PHOSPHATE 10 MG/ML IJ SOLN
INTRAMUSCULAR | Status: AC
  Filled 2020-02-02: qty 1

## 2020-02-02 MED ORDER — ALBUMIN HUMAN 5 % IV SOLN
INTRAVENOUS | Status: AC
  Filled 2020-02-02: qty 250

## 2020-02-02 MED ORDER — STERILE WATER FOR IRRIGATION IR SOLN
Status: DC | PRN
  Administered 2020-02-02: 500 mL

## 2020-02-02 MED ORDER — ACETAMINOPHEN 650 MG RE SUPP
650.0000 mg | Freq: Four times a day (QID) | RECTAL | Status: DC | PRN
  Filled 2020-02-02: qty 1

## 2020-02-02 MED ORDER — IOHEXOL 350 MG/ML SOLN
100.0000 mL | Freq: Once | INTRAVENOUS | Status: AC | PRN
  Administered 2020-02-02: 80 mL via INTRAVENOUS

## 2020-02-02 MED ORDER — PROPOFOL 10 MG/ML IV BOLUS
INTRAVENOUS | Status: AC
  Filled 2020-02-02: qty 20

## 2020-02-02 MED ORDER — LACTATED RINGERS IV SOLN: INTRAVENOUS | Status: DC | PRN

## 2020-02-02 MED ORDER — ALBUTEROL SULFATE (2.5 MG/3ML) 0.083% IN NEBU: 2.5000 mg | INHALATION_SOLUTION | RESPIRATORY_TRACT | Status: DC | PRN

## 2020-02-02 MED ORDER — ARTIFICIAL TEARS OPHTHALMIC OINT
TOPICAL_OINTMENT | OPHTHALMIC | Status: AC
  Filled 2020-02-02: qty 3.5

## 2020-02-02 MED ORDER — ONDANSETRON HCL 4 MG/2ML IJ SOLN: 4.0000 mg | Freq: Four times a day (QID) | INTRAMUSCULAR | Status: DC | PRN

## 2020-02-02 MED ORDER — FENTANYL CITRATE (PF) 100 MCG/2ML IJ SOLN: 25.0000 ug | INTRAMUSCULAR | Status: DC | PRN

## 2020-02-02 MED ORDER — VANCOMYCIN HCL IN DEXTROSE 1-5 GM/200ML-% IV SOLN: 1000.0000 mg | INTRAVENOUS | Status: DC

## 2020-02-02 SURGICAL SUPPLY — 21 items
BAG URO CATCHER STRL LF (MISCELLANEOUS) ×2 IMPLANT
CATH URET 5FR 28IN OPEN ENDED (CATHETERS) ×2 IMPLANT
CLOTH BEACON ORANGE TIMEOUT ST (SAFETY) ×2 IMPLANT
GLOVE BIO SURGEON STRL SZ8 (GLOVE) ×2 IMPLANT
GLOVE BIOGEL M 7.0 STRL (GLOVE) ×2 IMPLANT
GLOVE BIOGEL M 8.0 STRL (GLOVE) ×2 IMPLANT
GLOVE BIOGEL PI IND STRL 7.5 (GLOVE) ×1 IMPLANT
GLOVE BIOGEL PI INDICATOR 7.5 (GLOVE) ×1
GOWN STRL REUS W/ TWL XL LVL3 (GOWN DISPOSABLE) ×2 IMPLANT
GOWN STRL REUS W/TWL LRG LVL3 (GOWN DISPOSABLE) ×4 IMPLANT
GOWN STRL REUS W/TWL XL LVL3 (GOWN DISPOSABLE) ×4
GUIDEWIRE STR DUAL SENSOR (WIRE) ×2 IMPLANT
GUIDEWIRE ZIPWRE .038 STRAIGHT (WIRE) ×2 IMPLANT
KIT TURNOVER KIT A (KITS) ×2 IMPLANT
MANIFOLD NEPTUNE II (INSTRUMENTS) ×2 IMPLANT
PACK CYSTO (CUSTOM PROCEDURE TRAY) ×2 IMPLANT
STENT URET 6FRX26 CONTOUR (STENTS) ×2 IMPLANT
SYR 10ML LL (SYRINGE) ×2 IMPLANT
SYR CONTROL 10ML LL (SYRINGE) ×2 IMPLANT
TUBING CONNECTING 10 (TUBING) ×2 IMPLANT
TUBING UROLOGY SET (TUBING) ×2 IMPLANT

## 2020-02-02 NOTE — Transfer of Care (Signed)
Immediate Anesthesia Transfer of Care Note  Patient: Jaclyn Weaver  Procedure(s) Performed: CYSTOSCOPY WITH RETROGRADE PYELOGRAM/URETERAL STENT PLACEMENT (Left Ureter)  Patient Location: PACU  Anesthesia Type:General  Level of Consciousness: awake, alert , oriented and patient cooperative  Airway & Oxygen Therapy: Patient Spontanous Breathing and Patient connected to face mask oxygen  Post-op Assessment: Report given to RN and Post -op Vital signs reviewed and stable  Post vital signs: stable  Last Vitals:  Vitals Value Taken Time  BP 120/63 02/02/20 0448  Temp 37.2 C 02/02/20 0448  Pulse 79 02/02/20 0457  Resp 19 02/02/20 0457  SpO2 99 % 02/02/20 0457  Vitals shown include unvalidated device data.  Last Pain:  Vitals:   02/02/20 0139  TempSrc: Rectal         Complications: No complications documented.

## 2020-02-02 NOTE — Anesthesia Preprocedure Evaluation (Signed)
Anesthesia Evaluation  Patient identified by MRN, date of birth, ID band Patient confused    Reviewed: H&P , Patient's Chart, lab work & pertinent test results, Unable to perform ROS - Chart review only  Airway Mallampati: II  TM Distance: >3 FB Neck ROM: Full    Dental no notable dental hx.    Pulmonary COPD,    Pulmonary exam normal breath sounds clear to auscultation       Cardiovascular hypertension,  Rhythm:Regular Rate:Tachycardia     Neuro/Psych Dementia negative neurological ROS     GI/Hepatic negative GI ROS, Neg liver ROS,   Endo/Other  Hypothyroidism   Renal/GU negative Renal ROS  negative genitourinary   Musculoskeletal negative musculoskeletal ROS (+)   Abdominal   Peds negative pediatric ROS (+)  Hematology  (+) Blood dyscrasia, , On eliquis   Anesthesia Other Findings   Reproductive/Obstetrics negative OB ROS                             Anesthesia Physical Anesthesia Plan  ASA: III and emergent  Anesthesia Plan: General   Post-op Pain Management:    Induction: Intravenous  PONV Risk Score and Plan: 3 and Ondansetron, Dexamethasone and Treatment may vary due to age or medical condition  Airway Management Planned: LMA and Oral ETT  Additional Equipment:   Intra-op Plan:   Post-operative Plan: Extubation in OR  Informed Consent: I have reviewed the patients History and Physical, chart, labs and discussed the procedure including the risks, benefits and alternatives for the proposed anesthesia with the patient or authorized representative who has indicated his/her understanding and acceptance.     Dental advisory given  Plan Discussed with: CRNA and Surgeon  Anesthesia Plan Comments:         Anesthesia Quick Evaluation

## 2020-02-02 NOTE — Progress Notes (Signed)
Pharmacy Antibiotic Note  Jaclyn Weaver is a 87 y.o. female admitted on 02/01/2020 with sepsis.  Recent dx of ureteral stone and UTI (on cephalexin PTA).  Pt underwent cystoscopy and ureteral stent placement  1/7.  Pharmacy has been consulted for Vancomycin dosing.  Cefepime to continue upon admission Patient received Cefepime 2gm and Vancomycin 1gm IV x 1 dose each in the ED.  Plan: - Vancomycin 1000mg  IV q24h - Cefepime renally adjusted to 2gm IV q12h per antibiotic renal adjustment protocol - Follow renal function - Monitor vancomycin levels as needed - Follow culture results & sensitivities    Temp (24hrs), Avg:100.8 F (38.2 C), Min:100.7 F (38.2 C), Max:100.8 F (38.2 C)  Recent Labs  Lab 01/27/20 1308 02/01/20 2235 02/02/20 0035  WBC 7.8 17.5*  --   CREATININE 0.70 0.75  --   LATICACIDVEN  --  1.3 1.1    Estimated Creatinine Clearance: 42.8 mL/min (by C-G formula based on SCr of 0.75 mg/dL).    Allergies  Allergen Reactions  . Penicillin G Shortness Of Breath  . Penicillins     Antimicrobials this admission: 1/6 Vancomycin >>   1/6 Cefepime >>    Dose adjustments this admission:    Microbiology results: 1/7 BCx:   1/6 UCx:     Thank you for allowing pharmacy to be a part of this patient's care.  3/7, PharmD 02/02/2020 3:55 AM

## 2020-02-02 NOTE — Assessment & Plan Note (Signed)
-  Tachycardic, tachypneic, leukocytosis; source considered urinary -See complicated UTI

## 2020-02-02 NOTE — Hospital Course (Addendum)
Jaclyn Weaver is an 87 yo female with PMH severe dementia (mostly nonverbal), COPD, HTN, HLD, hypothyroidism, osteoporosis, history of blood clots (on Eliquis).  She was brought in from her SNF for new development of fever.  She had recently been treated with Zyvox p.o. after being seen in the ER on 01/28/2020 from a fall out of her wheelchair and urine culture then grew E faecalis. She had also underwent a CT head during that time which showed soft tissue swelling on her left forehead but no other obvious abnormality. During same work-up she was also found to have a 5 mm left ureteropelvic stone and was going to have outpatient intervention however due to her fever on presentation this hospitalization she underwent repeat CT abdomen/pelvis and this showed an obstructing 7 mm calculus at the left ureteropelvic junction.  She was taken to the OR and underwent stent placement in the left ureter. She was covered with Zyvox for her previous urine culture just prior to admission which grew E faecalis.  During hospitalization her current urine culture also grew Pseudomonas and she was started on cefepime for treatment of this.

## 2020-02-02 NOTE — Assessment & Plan Note (Addendum)
-  CT abdomen/pelvis this hospitalization shows left ureteropelvic stone measuring 7 mm with obstruction -Underwent left ureter stent placement with urology on admission -Initially started on vancomycin for E faecalis on prior UCx and was on Zyvox outpatient. Restarted back on Zyvox on 02/02/2020 to target E faecalis which was growing on urine culture on 01/27/2020.  New urine culture this hospitalization now growing Pseudomonas (being treated with cefepime). 7 days Zyvox/Vanc will be completed on 1/10, no further treatment for E. Faecalis after this.  - day 3 of uninterrupted cefepime; plan is 7 days total treatment; likely can d/c on Tuesday and finish course with cipro (give 4th dose prior to discharge) and only should need 3 more days treatment.

## 2020-02-02 NOTE — Assessment & Plan Note (Addendum)
-  Severe -Patient is nonverbal at baseline and only follows a few commands -SLP eval completed on 1/7: dys 3 diet and meds with ice cream or applesauce and monitored eating - palliative care consulted given potential aspiration event on 1/10 when eating; still likely can discharge on Tuesday with outpt involvement as well

## 2020-02-02 NOTE — H&P (Signed)
History and Physical    Jaclyn Weaver ONG:[id number] DOB: 1930-09-28 DOA: 02/01/2020  PCP: Soundra Pilon, FNP   Patient coming from: SNF  Chief Complaint: Fever  HPI: Jaclyn Weaver is a 87 y.o. female with medical history significant for Dementia, COPD, HTN, HLD, hypothyroidism, osteoporosis and history of blood clots and is on eliquis. She presents by EMS from SNF with report of fever. Daughter is at bedside. Pt is unable to provide any history. She is not on oxygen for her COPD normally. On afternoon of 02/01/20 she developed a fever and had decreased oxygen saturations which was requiring 4 L O2 to maintain normal saturation. Daughter states that she is currently being treated for UTI with Keflex since evaluation on 01/28/20 for UTI in the setting of left ureterolithiasis. Per daughter, this was changed to [facility] by urology at follow up [facility]) and surgery is being planned for stone removal and stent placement next week.    ED Course: She is found to have a left ureteral stone with left hydronephrosis with UTI.  She meets sepsis criteria with tachycardia, leukocytosis with complicated UTI.  Was placed on cefepime and vancomycin in the emergency room.  Urology was consulted and are coming in to take patient to the operating room for stone removal and stent placement now that stone is infected  Review of Systems:  Unable to obtain secondary to dementia  Past Medical History:  Diagnosis Date  . Constipation    from facility paperwork  . COPD (chronic obstructive pulmonary disease) (HCC)   . Dementia (HCC)    from facility paperwork  . Dermatitis    from facility paperwork  . Hyperlipidemia    from facility paperwork  . Hypertension   . Hypothyroidism   . Osteoporosis    from facility paperwork  . Urinary incontinence    from facility paperwork    Past Surgical History:  Procedure Laterality Date  . NO PAST SURGERIES      Social History  reports that she has never  smoked. She has never used smokeless tobacco. She reports that she does not drink alcohol and does not use drugs.  Allergies  Allergen Reactions  . Penicillin G Shortness Of Breath  . Penicillins     History reviewed. No pertinent family history.   Prior to Admission medications   Medication Sig Start Date End Date Taking? Authorizing Provider  acetaminophen (TYLENOL) 325 MG tablet Take 650 mg by mouth every 4 (four) hours as needed (for pain or a temperature greater than 100 F).    [provider]  alendronate-cholecalciferol (FOSAMAX PLUS D) 70-2800 MG-UNIT tablet Take 1 tablet by mouth every 7 (seven) days. Take with a full glass of water on an empty stomach.    [provider]  apixaban (ELIQUIS) 5 MG TABS tablet Take 5 mg by mouth 2 (two) times daily.    [provider]  bisacodyl (DULCOLAX) 10 MG suppository Place 10 mg rectally daily as needed (for constipation- "if no BM by the 4th day").    [provider]  Calcium Carbonate-Vitamin D 600-400 MG-UNIT tablet Take 1 tablet by mouth daily.    [provider]  cephALEXin (KEFLEX) 500 MG capsule Take 1 capsule (500 mg total) by mouth 4 (four) times daily. 01/27/20   Margarita Grizzle, MD  Cranberry 450 MG TABS Take 450 tablets by mouth daily.    [provider]  doxycycline (ADOXA) 100 MG tablet Take 100 mg by mouth 2 (two) times  daily. 10/20/19   [provider]  fluticasone-salmeterol (ADVAIR HFA) 45-21 MCG/ACT inhaler Inhale 1 puff into the lungs 2 (two) times daily.    [provider]  ipratropium (ATROVENT) 0.06 % nasal spray Place 2 sprays into both nostrils in the morning and at bedtime.    [provider]  levothyroxine (SYNTHROID) 75 MCG tablet Take 75 mcg by mouth daily before breakfast.    [provider]  magnesium hydroxide (MILK OF MAGNESIA) 400 MG/5ML suspension Take 30 mLs by mouth daily as needed for mild constipation (if no BM in 3 days).     [provider]  Multiple Vitamins-Minerals (MULTIVITAMIN WITH MINERALS) tablet Take 1 tablet by mouth daily.    [provider]  predniSONE (DELTASONE) 5 MG tablet Take 5 mg by mouth daily with breakfast.    [provider]  senna (SENOKOT) 8.6 MG TABS tablet Take 1 tablet by mouth daily.    [provider]  simvastatin (ZOCOR) 10 MG tablet Take 10 mg by mouth every other day.    [provider]  tamsulosin (FLOMAX) 0.4 MG CAPS capsule Take 0.4 mg by mouth at bedtime.    [provider]    Physical Exam: Vitals:   02/02/20 0215 02/02/20 0230 02/02/20 0245 02/02/20 0315  BP: (!) 153/86 (!) 152/86 (!) 141/84 (!) 145/87  Pulse: (!) 105 (!) 104 100 (!) 102  Resp: (!) 27 (!) 22 (!) 29 (!) 29  Temp:      TempSrc:      SpO2: 99% 100% 98% 96%    Constitutional: NAD, calm, comfortable Vitals:   02/02/20 0215 02/02/20 0230 02/02/20 0245 02/02/20 0315  BP: (!) 153/86 (!) 152/86 (!) 141/84 (!) 145/87  Pulse: (!) 105 (!) 104 100 (!) 102  Resp: (!) 27 (!) 22 (!) 29 (!) 29  Temp:      TempSrc:      SpO2: 99% 100% 98% 96%   General: Elderly ill appearing female. Oriented to self.  Eyes:  PERRL, conjunctivae normal.  Sclera nonicteric HENT:  [place]/AT, external ears normal.  Nares patent without epistasis.  Mucous membranes are normal  Neck: Soft, normal range of motion, supple, no masses,Trachea midline Respiratory: Equal breath sounds, shallow, mild expiratory wheezing, no crackles. Normal respiratory effort. No accessory muscle use.  Cardiovascular: Regular rate and rhythm, no murmurs / rubs / gallops. No extremity edema. Abdomen: Soft, no tenderness, nondistended, no rebound or guarding.  No masses palpated. No hepatosplenomegaly. Bowel sounds normoactive Musculoskeletal: Moves all 4 extremities spontaneously.  No cyanosis. No joint deformity upper and lower extremities. Normal muscle tone.  Skin: Warm, dry, intact no rashes, lesions,  ulcers. No induration Neurologic: withdraws from painful stimuli. Opens eyes to voice but does not verbally respond.    Labs on Admission: I have personally reviewed following labs and imaging studies  CBC: Recent Labs  Lab 01/27/20 1308 02/01/20 2235  WBC 7.8 17.5*  NEUTROABS  --  14.6*  HGB 13.1 13.7  HCT 39.8 42.7  MCV 98.8 99.8  PLT 259 264    Basic Metabolic Panel: Recent Labs  Lab 01/27/20 1308 02/01/20 2235  NA 140 140  K 3.9 3.9  CL 106 106  CO2 27 26  GLUCOSE 108* 130*  BUN 19 15  CREATININE 0.70 0.75  CALCIUM 9.1 9.2    GFR: Estimated Creatinine Clearance: 42.8 mL/min (by C-G formula based on SCr of 0.75 mg/dL).  Liver Function Tests: Recent Labs  Lab 01/27/20 1308  02/01/20 2235  AST 12* 19  ALT 12 19  ALKPHOS 45 45  BILITOT 0.7 0.9  PROT 6.2* 6.6  ALBUMIN 3.4* 3.7    Urine analysis:    Component Value Date/Time   COLORURINE YELLOW 02/01/2020 2326   APPEARANCEUR HAZY (A) 02/01/2020 2326   LABSPEC 1.017 02/01/2020 2326   PHURINE 6.0 02/01/2020 2326   GLUCOSEU NEGATIVE 02/01/2020 2326   HGBUR NEGATIVE 02/01/2020 2326   BILIRUBINUR NEGATIVE 02/01/2020 2326   KETONESUR 20 (A) 02/01/2020 2326   PROTEINUR NEGATIVE 02/01/2020 2326   NITRITE NEGATIVE 02/01/2020 2326   LEUKOCYTESUR MODERATE (A) 02/01/2020 2326    Radiological Exams on Admission: CT Angio Chest PE W and/or Wo Contrast  Result Date: 02/02/2020 CLINICAL DATA:  Fever, undergoing antibiotic therapy for urinary tract infection, COPD, wound drainage EXAM: CT ANGIOGRAPHY CHEST CT ABDOMEN AND PELVIS WITH CONTRAST TECHNIQUE: Multidetector CT imaging of the chest was performed using the standard protocol during bolus administration of intravenous contrast. Multiplanar CT image reconstructions and MIPs were obtained to evaluate the vascular anatomy. Multidetector CT imaging of the abdomen and pelvis was performed using the standard protocol during bolus administration of intravenous contrast.  CONTRAST:  72mL OMNIPAQUE IOHEXOL 350 MG/ML SOLN COMPARISON:  CT abdomen pelvis 01/27/2020 FINDINGS: CTA CHEST FINDINGS Cardiovascular: There is adequate opacification of the pulmonary arterial tree. No intraluminal filling defect to suggest acute pulmonary embolism. The central pulmonary arteries are of normal caliber. Mild coronary artery calcification. Global cardiac size within normal limits. No pericardial effusion. The thoracic aorta demonstrates mild atherosclerotic calcification. No aneurysm. Mediastinum/Nodes: Moderate hiatal hernia. Esophagus unremarkable. No pathologic thoracic adenopathy. Lungs/Pleura: Mild left basilar atelectasis. Lungs are otherwise clear. No pneumothorax or pleural effusion. There is central airway impaction involving the left lower lobe likely related to debris. No central obstructing mass lesion identified. Musculoskeletal: No acute bone abnormality. Review of the MIP images confirms the above findings. CT ABDOMEN and PELVIS FINDINGS Hepatobiliary: No focal liver abnormality is seen. No gallstones, gallbladder wall thickening, or biliary dilatation. Pancreas: Unremarkable Spleen: Unremarkable Adrenals/Urinary Tract: The adrenal glands are unremarkable. The kidneys are normal in size and position. Mild to moderate left hydronephrosis and mild perinephric stranding is again identified and appears unchanged with a an obstructing 7 mm calculus again identified within the left ureteropelvic junction. No additional nephro or urolithiasis. The right kidney is unremarkable. Layering hyperdense material within the bladder lumen demonstrates an altered configuration when compared to prior examination and most likely represents layering calcific debris within the bladder lumen. The bladder is otherwise unremarkable. Stomach/Bowel: Mild sigmoid diverticulosis. Moderate stool throughout the colon. Broad-based small umbilical hernia contains a single wall of the mid transverse colon. No  superimposed inflammatory change. The stomach, small bowel, and large bowel are otherwise unremarkable. Appendix normal. No free intraperitoneal gas or fluid. Vascular/Lymphatic: Mild aortoiliac atherosclerotic calcification. No aortic aneurysm. No pathologic adenopathy within the abdomen and pelvis. Reproductive: Status post hysterectomy. No adnexal masses. Other: Small stool within the rectal vault. The rectum is otherwise unremarkable. Small supraumbilical ventral hernia is again seen containing mesenteric fat Musculoskeletal: No acute bone abnormality. Degenerative changes noted within the lumbar spine. Review of the MIP images confirms the above findings. IMPRESSION: No pulmonary embolism.  No acute intrathoracic pathology identified. Persistent moderate left hydronephrosis secondary to an obstructing 7 mm calculus at the left ureteropelvic junction. This appears unchanged from prior examination. High density material within the bladder represents layering calcific debris. Small supraumbilical ventral hernia containing mesenteric fat and umbilical hernia  containing a single wall of unremarkable transverse colon. These are unchanged. Aortic Atherosclerosis (ICD10-I70.0). Electronically Signed   By: Helyn Numbers MD   On: 02/02/2020 00:43   CT ABDOMEN PELVIS W CONTRAST  Result Date: 02/02/2020 CLINICAL DATA:  Fever, undergoing antibiotic therapy for urinary tract infection, COPD, wound drainage EXAM: CT ANGIOGRAPHY CHEST CT ABDOMEN AND PELVIS WITH CONTRAST TECHNIQUE: Multidetector CT imaging of the chest was performed using the standard protocol during bolus administration of intravenous contrast. Multiplanar CT image reconstructions and MIPs were obtained to evaluate the vascular anatomy. Multidetector CT imaging of the abdomen and pelvis was performed using the standard protocol during bolus administration of intravenous contrast. CONTRAST:  21mL OMNIPAQUE IOHEXOL 350 MG/ML SOLN COMPARISON:  CT abdomen  pelvis 01/27/2020 FINDINGS: CTA CHEST FINDINGS Cardiovascular: There is adequate opacification of the pulmonary arterial tree. No intraluminal filling defect to suggest acute pulmonary embolism. The central pulmonary arteries are of normal caliber. Mild coronary artery calcification. Global cardiac size within normal limits. No pericardial effusion. The thoracic aorta demonstrates mild atherosclerotic calcification. No aneurysm. Mediastinum/Nodes: Moderate hiatal hernia. Esophagus unremarkable. No pathologic thoracic adenopathy. Lungs/Pleura: Mild left basilar atelectasis. Lungs are otherwise clear. No pneumothorax or pleural effusion. There is central airway impaction involving the left lower lobe likely related to debris. No central obstructing mass lesion identified. Musculoskeletal: No acute bone abnormality. Review of the MIP images confirms the above findings. CT ABDOMEN and PELVIS FINDINGS Hepatobiliary: No focal liver abnormality is seen. No gallstones, gallbladder wall thickening, or biliary dilatation. Pancreas: Unremarkable Spleen: Unremarkable Adrenals/Urinary Tract: The adrenal glands are unremarkable. The kidneys are normal in size and position. Mild to moderate left hydronephrosis and mild perinephric stranding is again identified and appears unchanged with a an obstructing 7 mm calculus again identified within the left ureteropelvic junction. No additional nephro or urolithiasis. The right kidney is unremarkable. Layering hyperdense material within the bladder lumen demonstrates an altered configuration when compared to prior examination and most likely represents layering calcific debris within the bladder lumen. The bladder is otherwise unremarkable. Stomach/Bowel: Mild sigmoid diverticulosis. Moderate stool throughout the colon. Broad-based small umbilical hernia contains a single wall of the mid transverse colon. No superimposed inflammatory change. The stomach, small bowel, and large bowel are  otherwise unremarkable. Appendix normal. No free intraperitoneal gas or fluid. Vascular/Lymphatic: Mild aortoiliac atherosclerotic calcification. No aortic aneurysm. No pathologic adenopathy within the abdomen and pelvis. Reproductive: Status post hysterectomy. No adnexal masses. Other: Small stool within the rectal vault. The rectum is otherwise unremarkable. Small supraumbilical ventral hernia is again seen containing mesenteric fat Musculoskeletal: No acute bone abnormality. Degenerative changes noted within the lumbar spine. Review of the MIP images confirms the above findings. IMPRESSION: No pulmonary embolism.  No acute intrathoracic pathology identified. Persistent moderate left hydronephrosis secondary to an obstructing 7 mm calculus at the left ureteropelvic junction. This appears unchanged from prior examination. High density material within the bladder represents layering calcific debris. Small supraumbilical ventral hernia containing mesenteric fat and umbilical hernia containing a single wall of unremarkable transverse colon. These are unchanged. Aortic Atherosclerosis (ICD10-I70.0). Electronically Signed   By: Helyn Numbers MD   On: 02/02/2020 00:43   DG Chest Port 1 View  Result Date: 02/01/2020 CLINICAL DATA:  Sepsis EXAM: PORTABLE CHEST 1 VIEW COMPARISON:  01/27/2020 FINDINGS: The heart size and mediastinal contours are within normal limits. Both lungs are clear. The visualized skeletal structures are unremarkable. IMPRESSION: No active disease. Electronically Signed   By: Helyn Numbers MD  On: 02/01/2020 23:04    Assessment/Plan Principal Problem:   Sepsis  Ms. Cogle is admitted to [place] floor. She has been started on cefepime and vancomycin for empiric antibiotic coverage.  Cultures were obtained to the emergency room and antibiotics will be adjusted when culture results are known. Sepsis based on urinary tract infection that is complicated with a ureteral stone, leukocytosis and  tachycardia. Check CBC, electrolytes and renal function morning.  Will monitor procalcitonin  Active Problems:   Complicated UTI (urinary tract infection) UTI complicated by ureteral stone.  Patient is taken to the operating room by urology for stone removal and stent placement tonight. Treat with antibiotics as above    Ureteral stone with hydronephrosis Urology consulted and is taking patient to the operating room for stent and stone removal    Leukocytosis Elevated white count secondary to sepsis and infection.  Monitor CBC    COPD (chronic obstructive pulmonary disease) Patient replaced on Dulera MDI as formulary replacement for her Advair while she is in the hospital. MDI as needed for shortness of breath, cough, wheeze. Supplemental oxygen as needed keep O2 sat between 92 to 96%    Dementia Chronic.  Patient is unable to provide any history    DVT prophylaxis: Is on Eliquis for anticoagulation chronically.  Eliquis is continued Code Status:   Full code Family Communication:  Diagnosis and plan discussed with patient's daughter who is at the bedside.  Questions answered.  Further recommendations to follow as clinically indicated Disposition Plan:   Patient is from:  SNF  Anticipated DC to:  SNF  Anticipated DC date:  Anticipate more than 2 midnight stay in the hospital to treat acute      condition  Anticipated DC barriers: Barriers to discharge identified at this time  Consults called:  Urology Admission status:  Inpatient  Yevonne Aline Aisia Correira MD [facility]  How to contact the [facility] Attending or Consulting provider [employer] or covering provider during after hours [place], for this patient?   1. Check the care team in [facility] and look for a) attending/consulting [facility] provider listed and b) the [facility] team listed 2. Log into [email or web address] and use [place]'s universal password to access. If you do not have the password, please contact the hospital operator. 3. Locate the  [facility] provider you are looking for under [facility] and page to a number that you can be directly reached. 4. If you still have difficulty reaching the provider, please page the [facility] (Director on Call) for the Hospitalists listed on amion for assistance.  02/02/2020, 3:41 AM

## 2020-02-02 NOTE — Consult Note (Signed)
Urology Consult   Physician requesting consult: Harrold Donath, MD  Reason for consult: Left UPJ Stone  History of Present Illness: Jaclyn Weaver is a 87 y.o. presents to the ED with fever reportedly 103 at her living facility.  Patient is nonverbal and has advanced dementia. Most of her history is obtained from her daughter, Breyona Swander who is her POA. Daughter states unclear if she is in pain. Pt appears to be without pain.  Her temperature is 100.7 in the emergency department.   She was recently seen by Dr. Tresa Moore in the office on 01/30/2020 with a left UPJ stone measuring approximately 5 mm by CT 01/27/2020.  She was found to have bacteriuria with Enterococcus and has been undergoing treatment with Macrobid twice daily.  She has plans for outpatient left ureteroscopy with laser lithotripsy with possible TURBT given a questionable bladder lesion around her left UVJ.  CT A/P 02/02/2020 reveals an obstructing 7 mm left UPJ stone with moderate left hydronephrosis.  She also has high density material within the bladder representing layering calcific debris.  Her T-max has been 100.7.  WBC is elevated at 17.5.  Creatinine is 0.75.  Urinalysis demonstrates moderate leukocyte esterase, negative nitrates, rare bacteria.  Of note, she is on Eliquis.   Past Medical History:  Diagnosis Date  . Constipation    from facility paperwork  . COPD (chronic obstructive pulmonary disease) ([place])   . Dementia ([place])    from facility paperwork  . Dermatitis    from facility paperwork  . Hyperlipidemia    from facility paperwork  . Hypertension   . Osteoporosis    from facility paperwork  . Urinary incontinence    from facility paperwork    History reviewed. No pertinent surgical history.   Current Hospital Medications:  Home meds:  No current facility-administered medications on file prior to encounter.   Current Outpatient Medications on File Prior to Encounter  Medication Sig Dispense Refill   . acetaminophen (TYLENOL) 325 MG tablet Take 650 mg by mouth every 4 (four) hours as needed (for pain or a temperature greater than 100 F).    Marland Kitchen alendronate-cholecalciferol (FOSAMAX PLUS D) 70-2800 MG-UNIT tablet Take 1 tablet by mouth every 7 (seven) days. Take with a full glass of water on an empty stomach.    Marland Kitchen apixaban (ELIQUIS) 5 MG TABS tablet Take 5 mg by mouth 2 (two) times daily.    . bisacodyl (DULCOLAX) 10 MG suppository Place 10 mg rectally daily as needed (for constipation- "if no BM by the 4th day").    . Calcium Carbonate-Vitamin D 600-400 MG-UNIT tablet Take 1 tablet by mouth daily.    . cephALEXin (KEFLEX) 500 MG capsule Take 1 capsule (500 mg total) by mouth 4 (four) times daily. 20 capsule 0  . Cranberry 450 MG TABS Take 450 tablets by mouth daily.    Marland Kitchen doxycycline (ADOXA) 100 MG tablet Take 100 mg by mouth 2 (two) times daily.    . fluticasone-salmeterol (ADVAIR HFA) 45-21 MCG/ACT inhaler Inhale 1 puff into the lungs 2 (two) times daily.    Marland Kitchen ipratropium (ATROVENT) 0.06 % nasal spray Place 2 sprays into both nostrils in the morning and at bedtime.    Marland Kitchen levothyroxine (SYNTHROID) 75 MCG tablet Take 75 mcg by mouth daily before breakfast.    . magnesium hydroxide (MILK OF MAGNESIA) 400 MG/5ML suspension Take 30 mLs by mouth daily as needed for mild constipation (if no BM in 3 days).    Marland Kitchen  Multiple Vitamins-Minerals (MULTIVITAMIN WITH MINERALS) tablet Take 1 tablet by mouth daily.    . predniSONE (DELTASONE) 5 MG tablet Take 5 mg by mouth daily with breakfast.    . senna (SENOKOT) 8.6 MG TABS tablet Take 1 tablet by mouth daily.    . simvastatin (ZOCOR) 10 MG tablet Take 10 mg by mouth every other day.    . tamsulosin (FLOMAX) 0.4 MG CAPS capsule Take 0.4 mg by mouth at bedtime.       Scheduled Meds: Continuous Infusions: . lactated ringers 150 mL/hr at 02/01/20 2244   PRN Meds:.  Allergies:  Allergies  Allergen Reactions  . Penicillin G Shortness Of Breath  .  Penicillins     No family history on file.  Social History:  has no history on file for tobacco use, alcohol use, and drug use.  ROS: A complete review of systems was performed.  All systems are negative except for pertinent findings as noted.  Physical Exam:  Vital signs in last 24 hours: Temp:  [100.7 F (38.2 C)-100.8 F (38.2 C)] 100.7 F (38.2 C) (01/07 0139) Pulse Rate:  [103-132] 104 (01/07 0230) Resp:  [22-34] 22 (01/07 0230) BP: (142-195)/(84-137) 152/86 (01/07 0230) SpO2:  [96 %-100 %] 100 % (01/07 0230) Constitutional:  Alert, nonverbal Cardiovascular: Regular rate and rhythm Respiratory: Normal respiratory effort, Lungs clear bilaterally GI: Abdomen is soft, nondistended, nontender GU: No CVA tenderness  Laboratory Data:  Recent Labs    02/01/20 2235  WBC 17.5*  HGB 13.7  HCT 42.7  PLT 264    Recent Labs    02/01/20 2235  NA 140  K 3.9  CL 106  GLUCOSE 130*  BUN 15  CALCIUM 9.2  CREATININE 0.75     Results for orders placed or performed during the hospital encounter of 02/01/20 (from the past 24 hour(s))  Lactic acid, plasma     Status: None   Collection Time: 02/01/20 10:35 PM  Result Value Ref Range   Lactic Acid, Venous 1.3 0.5 - 1.9 mmol/L  Comprehensive metabolic panel     Status: Abnormal   Collection Time: 02/01/20 10:35 PM  Result Value Ref Range   Sodium 140 135 - 145 mmol/L   Potassium 3.9 3.5 - 5.1 mmol/L   Chloride 106 98 - 111 mmol/L   CO2 26 22 - 32 mmol/L   Glucose, Bld 130 (H) 70 - 99 mg/dL   BUN 15 8 - 23 mg/dL   Creatinine, Ser 0.75 0.44 - 1.00 mg/dL   Calcium 9.2 8.9 - 10.3 mg/dL   Total Protein 6.6 6.5 - 8.1 g/dL   Albumin 3.7 3.5 - 5.0 g/dL   AST 19 15 - 41 U/L   ALT 19 0 - 44 U/L   Alkaline Phosphatase 45 38 - 126 U/L   Total Bilirubin 0.9 0.3 - 1.2 mg/dL   GFR, Estimated >60 >60 mL/min   Anion gap 8 5 - 15  CBC WITH DIFFERENTIAL     Status: Abnormal   Collection Time: 02/01/20 10:35 PM  Result Value Ref  Range   WBC 17.5 (H) 4.0 - 10.5 K/uL   RBC 4.28 3.87 - 5.11 MIL/uL   Hemoglobin 13.7 12.0 - 15.0 g/dL   HCT 42.7 36.0 - 46.0 %   MCV 99.8 80.0 - 100.0 fL   MCH 32.0 26.0 - 34.0 pg   MCHC 32.1 30.0 - 36.0 g/dL   RDW 12.8 11.5 - 15.5 %   Platelets 264 150 - 400  K/uL   nRBC 0.0 0.0 - 0.2 %   Neutrophils Relative % 84 %   Neutro Abs 14.6 (H) 1.7 - 7.7 K/uL   Lymphocytes Relative 7 %   Lymphs Abs 1.2 0.7 - 4.0 K/uL   Monocytes Relative 6 %   Monocytes Absolute 1.1 (H) 0.1 - 1.0 K/uL   Eosinophils Relative 2 %   Eosinophils Absolute 0.4 0.0 - 0.5 K/uL   Basophils Relative 0 %   Basophils Absolute 0.1 0.0 - 0.1 K/uL   Immature Granulocytes 1 %   Abs Immature Granulocytes 0.08 (H) 0.00 - 0.07 K/uL  Protime-INR     Status: Abnormal   Collection Time: 02/01/20 10:35 PM  Result Value Ref Range   Prothrombin Time 15.8 (H) 11.4 - 15.2 seconds   INR 1.3 (H) 0.8 - 1.2  APTT     Status: None   Collection Time: 02/01/20 10:35 PM  Result Value Ref Range   aPTT 32 24 - 36 seconds  Blood gas, venous     Status: None   Collection Time: 02/01/20 10:39 PM  Result Value Ref Range   pH, Ven 7.316 7.250 - 7.430   pCO2, Ven 54.2 44.0 - 60.0 mmHg   pO2, Ven 37.9 32.0 - 45.0 mmHg   Bicarbonate 26.9 20.0 - 28.0 mmol/L   Acid-Base Excess 0.1 0.0 - 2.0 mmol/L   O2 Saturation 58.6 %   Patient temperature 98.6   POC SARS Coronavirus 2 Ag-ED - Nasal Swab (BD Veritor Kit)     Status: None   Collection Time: 02/01/20 10:58 PM  Result Value Ref Range   SARS Coronavirus 2 Ag NEGATIVE NEGATIVE  Resp Panel by RT-PCR (Flu A&B, Covid) In/Out Cath Urine     Status: None   Collection Time: 02/01/20 11:05 PM   Specimen: In/Out Cath Urine; Nasopharyngeal(NP) swabs in vial transport medium  Result Value Ref Range   SARS Coronavirus 2 by RT PCR NEGATIVE NEGATIVE   Influenza A by PCR NEGATIVE NEGATIVE   Influenza B by PCR NEGATIVE NEGATIVE  Urinalysis, Routine w reflex microscopic Urine, Unspecified Source      Status: Abnormal   Collection Time: 02/01/20 11:26 PM  Result Value Ref Range   Color, Urine YELLOW YELLOW   APPearance HAZY (A) CLEAR   Specific Gravity, Urine 1.017 1.005 - 1.030   pH 6.0 5.0 - 8.0   Glucose, UA NEGATIVE NEGATIVE mg/dL   Hgb urine dipstick NEGATIVE NEGATIVE   Bilirubin Urine NEGATIVE NEGATIVE   Ketones, ur 20 (A) NEGATIVE mg/dL   Protein, ur NEGATIVE NEGATIVE mg/dL   Nitrite NEGATIVE NEGATIVE   Leukocytes,Ua MODERATE (A) NEGATIVE   RBC / HPF 11-20 0 - 5 RBC/hpf   WBC, UA 21-50 0 - 5 WBC/hpf   Bacteria, UA RARE (A) NONE SEEN   Squamous Epithelial / LPF 0-5 0 - 5   Mucus PRESENT   Lactic acid, plasma     Status: None   Collection Time: 02/02/20 12:35 AM  Result Value Ref Range   Lactic Acid, Venous 1.1 0.5 - 1.9 mmol/L   Recent Results (from the past 240 hour(s))  Resp Panel by RT-PCR (Flu A&B, Covid) Nasopharyngeal Swab     Status: None   Collection Time: 01/27/20  1:08 PM   Specimen: Nasopharyngeal Swab; Nasopharyngeal(NP) swabs in vial transport medium  Result Value Ref Range Status   SARS Coronavirus 2 by RT PCR NEGATIVE NEGATIVE Final    Comment: (NOTE) SARS-CoV-2 target nucleic acids are NOT  DETECTED.  The SARS-CoV-2 RNA is generally detectable in upper respiratory specimens during the acute phase of infection. The lowest concentration of SARS-CoV-2 viral copies this assay can detect is 138 copies/mL. A negative result does not preclude SARS-Cov-2 infection and should not be used as the sole basis for treatment or other patient management decisions. A negative result may occur with  improper specimen collection/handling, submission of specimen other than nasopharyngeal swab, presence of viral mutation(s) within the areas targeted by this assay, and inadequate number of viral copies(<138 copies/mL). A negative result must be combined with clinical observations, patient history, and epidemiological information. The expected result is Negative.  Fact  Sheet for Patients:  EntrepreneurPulse.com.au  Fact Sheet for Healthcare Providers:  IncredibleEmployment.be  This test is no t yet approved or cleared by the [place] FDA and  has been authorized for detection and/or diagnosis of SARS-CoV-2 by FDA under an Emergency Use Authorization (EUA). This EUA will remain  in effect (meaning this test can be used) for the duration of the COVID-19 declaration under Section 564(b)(1) of the Act, 21 U.S.C.section 360bbb-3(b)(1), unless the authorization is terminated  or revoked sooner.       Influenza A by PCR NEGATIVE NEGATIVE Final   Influenza B by PCR NEGATIVE NEGATIVE Final    Comment: (NOTE) The Xpert Xpress SARS-CoV-2/FLU/RSV plus assay is intended as an aid in the diagnosis of influenza from Nasopharyngeal swab specimens and should not be used as a sole basis for treatment. Nasal washings and aspirates are unacceptable for Xpert Xpress SARS-CoV-2/FLU/RSV testing.  Fact Sheet for Patients: EntrepreneurPulse.com.au  Fact Sheet for Healthcare Providers: IncredibleEmployment.be  This test is not yet approved or cleared by the [place] FDA and has been authorized for detection and/or diagnosis of SARS-CoV-2 by FDA under an Emergency Use Authorization (EUA). This EUA will remain in effect (meaning this test can be used) for the duration of the COVID-19 declaration under Section 564(b)(1) of the Act, 21 U.S.C. section 360bbb-3(b)(1), unless the authorization is terminated or revoked.  Performed at [facility], [place] [address] [place], [place] [address]   Urine Culture     Status: Abnormal   Collection Time: 01/27/20  1:08 PM   Specimen: Urine, Catheterized  Result Value Ref Range Status   Specimen Description   Final    URINE, CATHETERIZED Performed at [facility] [address] [place], [place] [address]     Special Requests   Final    NONE Performed at [facility], [place] [address], [place], [place] [address]    Culture >=100,000 COLONIES/mL ENTEROCOCCUS FAECALIS (A)  Final   Report Status 01/29/2020 FINAL  Final   Organism ID, Bacteria ENTEROCOCCUS FAECALIS (A)  Final      Susceptibility   Enterococcus faecalis - MIC*    AMPICILLIN <=2 SENSITIVE Sensitive     NITROFURANTOIN <=16 SENSITIVE Sensitive     VANCOMYCIN 1 SENSITIVE Sensitive     * >=100,000 COLONIES/mL ENTEROCOCCUS FAECALIS  Resp Panel by RT-PCR (Flu A&B, Covid) In/Out Cath Urine     Status: None   Collection Time: 02/01/20 11:05 PM   Specimen: In/Out Cath Urine; Nasopharyngeal(NP) swabs in vial transport medium  Result Value Ref Range Status   SARS Coronavirus 2 by RT PCR NEGATIVE NEGATIVE Final    Comment: (NOTE) SARS-CoV-2 target nucleic acids are NOT DETECTED.  The SARS-CoV-2 RNA is generally detectable in upper respiratory specimens during the acute phase of infection. The lowest concentration  of SARS-CoV-2 viral copies this assay can detect is 138 copies/mL. A negative result does not preclude SARS-Cov-2 infection and should not be used as the sole basis for treatment or other patient management decisions. A negative result may occur with  improper specimen collection/handling, submission of specimen other than nasopharyngeal swab, presence of viral mutation(s) within the areas targeted by this assay, and inadequate number of viral copies(<138 copies/mL). A negative result must be combined with clinical observations, patient history, and epidemiological information. The expected result is Negative.  Fact Sheet for Patients:  BloggerCourse.com  Fact Sheet for Healthcare Providers:  SeriousBroker.it  This test is no t yet approved or cleared by the [place] FDA and  has been authorized for detection and/or diagnosis of SARS-CoV-2 by FDA under  an Emergency Use Authorization (EUA). This EUA will remain  in effect (meaning this test can be used) for the duration of the COVID-19 declaration under Section 564(b)(1) of the Act, 21 U.S.C.section 360bbb-3(b)(1), unless the authorization is terminated  or revoked sooner.       Influenza A by PCR NEGATIVE NEGATIVE Final   Influenza B by PCR NEGATIVE NEGATIVE Final    Comment: (NOTE) The Xpert Xpress SARS-CoV-2/FLU/RSV plus assay is intended as an aid in the diagnosis of influenza from Nasopharyngeal swab specimens and should not be used as a sole basis for treatment. Nasal washings and aspirates are unacceptable for Xpert Xpress SARS-CoV-2/FLU/RSV testing.  Fact Sheet for Patients: BloggerCourse.com  Fact Sheet for Healthcare Providers: SeriousBroker.it  This test is not yet approved or cleared by the [place] FDA and has been authorized for detection and/or diagnosis of SARS-CoV-2 by FDA under an Emergency Use Authorization (EUA). This EUA will remain in effect (meaning this test can be used) for the duration of the COVID-19 declaration under Section 564(b)(1) of the Act, 21 U.S.C. section 360bbb-3(b)(1), unless the authorization is terminated or revoked.  Performed at [facility], [address] [place], [place] [address]     Renal Function: Recent Labs    01/27/20 1308 02/01/20 2235  CREATININE 0.70 0.75   Estimated Creatinine Clearance: 42.8 mL/min (by C-G formula based on SCr of 0.75 mg/dL).  Radiologic Imaging: CT Angio Chest PE W and/or Wo Contrast  Result Date: 02/02/2020 CLINICAL DATA:  Fever, undergoing antibiotic therapy for urinary tract infection, COPD, wound drainage EXAM: CT ANGIOGRAPHY CHEST CT ABDOMEN AND PELVIS WITH CONTRAST TECHNIQUE: Multidetector CT imaging of the chest was performed using the standard protocol during bolus administration of intravenous contrast.  Multiplanar CT image reconstructions and MIPs were obtained to evaluate the vascular anatomy. Multidetector CT imaging of the abdomen and pelvis was performed using the standard protocol during bolus administration of intravenous contrast. CONTRAST:  41mL OMNIPAQUE IOHEXOL 350 MG/ML SOLN COMPARISON:  CT abdomen pelvis 01/27/2020 FINDINGS: CTA CHEST FINDINGS Cardiovascular: There is adequate opacification of the pulmonary arterial tree. No intraluminal filling defect to suggest acute pulmonary embolism. The central pulmonary arteries are of normal caliber. Mild coronary artery calcification. Global cardiac size within normal limits. No pericardial effusion. The thoracic aorta demonstrates mild atherosclerotic calcification. No aneurysm. Mediastinum/Nodes: Moderate hiatal hernia. Esophagus unremarkable. No pathologic thoracic adenopathy. Lungs/Pleura: Mild left basilar atelectasis. Lungs are otherwise clear. No pneumothorax or pleural effusion. There is central airway impaction involving the left lower lobe likely related to debris. No central obstructing mass lesion identified. Musculoskeletal: No acute bone abnormality. Review of the MIP images confirms the above findings. CT ABDOMEN and PELVIS FINDINGS Hepatobiliary: No  focal liver abnormality is seen. No gallstones, gallbladder wall thickening, or biliary dilatation. Pancreas: Unremarkable Spleen: Unremarkable Adrenals/Urinary Tract: The adrenal glands are unremarkable. The kidneys are normal in size and position. Mild to moderate left hydronephrosis and mild perinephric stranding is again identified and appears unchanged with a an obstructing 7 mm calculus again identified within the left ureteropelvic junction. No additional nephro or urolithiasis. The right kidney is unremarkable. Layering hyperdense material within the bladder lumen demonstrates an altered configuration when compared to prior examination and most likely represents layering calcific debris  within the bladder lumen. The bladder is otherwise unremarkable. Stomach/Bowel: Mild sigmoid diverticulosis. Moderate stool throughout the colon. Broad-based small umbilical hernia contains a single wall of the mid transverse colon. No superimposed inflammatory change. The stomach, small bowel, and large bowel are otherwise unremarkable. Appendix normal. No free intraperitoneal gas or fluid. Vascular/Lymphatic: Mild aortoiliac atherosclerotic calcification. No aortic aneurysm. No pathologic adenopathy within the abdomen and pelvis. Reproductive: Status post hysterectomy. No adnexal masses. Other: Small stool within the rectal vault. The rectum is otherwise unremarkable. Small supraumbilical ventral hernia is again seen containing mesenteric fat Musculoskeletal: No acute bone abnormality. Degenerative changes noted within the lumbar spine. Review of the MIP images confirms the above findings. IMPRESSION: No pulmonary embolism.  No acute intrathoracic pathology identified. Persistent moderate left hydronephrosis secondary to an obstructing 7 mm calculus at the left ureteropelvic junction. This appears unchanged from prior examination. High density material within the bladder represents layering calcific debris. Small supraumbilical ventral hernia containing mesenteric fat and umbilical hernia containing a single wall of unremarkable transverse colon. These are unchanged. Aortic Atherosclerosis (ICD10-I70.0). Electronically Signed   By: Fidela Salisbury MD   On: 02/02/2020 00:43   CT ABDOMEN PELVIS W CONTRAST  Result Date: 02/02/2020 CLINICAL DATA:  Fever, undergoing antibiotic therapy for urinary tract infection, COPD, wound drainage EXAM: CT ANGIOGRAPHY CHEST CT ABDOMEN AND PELVIS WITH CONTRAST TECHNIQUE: Multidetector CT imaging of the chest was performed using the standard protocol during bolus administration of intravenous contrast. Multiplanar CT image reconstructions and MIPs were obtained to evaluate the  vascular anatomy. Multidetector CT imaging of the abdomen and pelvis was performed using the standard protocol during bolus administration of intravenous contrast. CONTRAST:  64mL OMNIPAQUE IOHEXOL 350 MG/ML SOLN COMPARISON:  CT abdomen pelvis 01/27/2020 FINDINGS: CTA CHEST FINDINGS Cardiovascular: There is adequate opacification of the pulmonary arterial tree. No intraluminal filling defect to suggest acute pulmonary embolism. The central pulmonary arteries are of normal caliber. Mild coronary artery calcification. Global cardiac size within normal limits. No pericardial effusion. The thoracic aorta demonstrates mild atherosclerotic calcification. No aneurysm. Mediastinum/Nodes: Moderate hiatal hernia. Esophagus unremarkable. No pathologic thoracic adenopathy. Lungs/Pleura: Mild left basilar atelectasis. Lungs are otherwise clear. No pneumothorax or pleural effusion. There is central airway impaction involving the left lower lobe likely related to debris. No central obstructing mass lesion identified. Musculoskeletal: No acute bone abnormality. Review of the MIP images confirms the above findings. CT ABDOMEN and PELVIS FINDINGS Hepatobiliary: No focal liver abnormality is seen. No gallstones, gallbladder wall thickening, or biliary dilatation. Pancreas: Unremarkable Spleen: Unremarkable Adrenals/Urinary Tract: The adrenal glands are unremarkable. The kidneys are normal in size and position. Mild to moderate left hydronephrosis and mild perinephric stranding is again identified and appears unchanged with a an obstructing 7 mm calculus again identified within the left ureteropelvic junction. No additional nephro or urolithiasis. The right kidney is unremarkable. Layering hyperdense material within the bladder lumen demonstrates an altered configuration when compared to prior  examination and most likely represents layering calcific debris within the bladder lumen. The bladder is otherwise unremarkable. Stomach/Bowel:  Mild sigmoid diverticulosis. Moderate stool throughout the colon. Broad-based small umbilical hernia contains a single wall of the mid transverse colon. No superimposed inflammatory change. The stomach, small bowel, and large bowel are otherwise unremarkable. Appendix normal. No free intraperitoneal gas or fluid. Vascular/Lymphatic: Mild aortoiliac atherosclerotic calcification. No aortic aneurysm. No pathologic adenopathy within the abdomen and pelvis. Reproductive: Status post hysterectomy. No adnexal masses. Other: Small stool within the rectal vault. The rectum is otherwise unremarkable. Small supraumbilical ventral hernia is again seen containing mesenteric fat Musculoskeletal: No acute bone abnormality. Degenerative changes noted within the lumbar spine. Review of the MIP images confirms the above findings. IMPRESSION: No pulmonary embolism.  No acute intrathoracic pathology identified. Persistent moderate left hydronephrosis secondary to an obstructing 7 mm calculus at the left ureteropelvic junction. This appears unchanged from prior examination. High density material within the bladder represents layering calcific debris. Small supraumbilical ventral hernia containing mesenteric fat and umbilical hernia containing a single wall of unremarkable transverse colon. These are unchanged. Aortic Atherosclerosis (ICD10-I70.0). Electronically Signed   By: Fidela Salisbury MD   On: 02/02/2020 00:43   DG Chest Port 1 View  Result Date: 02/01/2020 CLINICAL DATA:  Sepsis EXAM: PORTABLE CHEST 1 VIEW COMPARISON:  01/27/2020 FINDINGS: The heart size and mediastinal contours are within normal limits. Both lungs are clear. The visualized skeletal structures are unremarkable. IMPRESSION: No active disease. Electronically Signed   By: Fidela Salisbury MD   On: 02/01/2020 23:04    I independently reviewed the above imaging studies.  Impression/Recommendation: #1.  Obstructing left UPJ stone: CT A/P 02/02/2020 with 7 mm left  UPJ stone with moderate hydronephrosis.  Associated with fever, leukocytosis, signs of infection.  -Discussed with patient and daughter that we will proceed with cystoscopy, left retrograde pyelogram, left ureteral stent placement. -Continue broad-spectrum antibiotics -Admit to medicine given significant comorbidities and early sepsis. -Dr. Tresa Moore saw her in the office on 01/30/2019 and is scheduling cystoscopy, left URS/LL, possible TURBT  -The risks, benefits and alternatives of cystoscopy with left JJ stent placement was discussed with the patient's daughter.  Risks include, but are not limited to: bleeding, urinary tract infection, ureteral injury, ureteral stricture disease, chronic pain, urinary symptoms, bladder injury, stent migration, the need for nephrostomy tube placement, MI, CVA, DVT, PE and the inherent risks with general anesthesia.  The patient's daughter who is her POA voices understanding and wishes to proceed.    Matt R. Maximus Hoffert MD 02/02/2020, 3:09 AM  [facility]  Pager: [phone]   CC: Harrold Donath, MD

## 2020-02-02 NOTE — [place] FL2 (Signed)
MEDICAID FL2 LEVEL OF CARE SCREENING TOOL     IDENTIFICATION  Patient Name: Jaclyn Weaver Birthdate: [birth year] Sex: female Admission Date (Current Location): 02/01/2020  [facility] and [place] Number:  Producer, television/film/video and Address:  [facility]  [address], [place] [address]      Provider Number: [id number]  Attending Physician Name and Address:  Lewie Chamber, MD  Relative Name and Phone Number:  Rosalita Chessman dtr [phone]    Current Level of Care: Hospital Recommended Level of Care: Memory Care Prior Approval Number:    Date Approved/Denied:   PASRR Number:    Discharge Plan: Other (Comment) (return back to memory care unit)    Current Diagnoses: Patient Active Problem List   Diagnosis Date Noted  . Sepsis (HCC) 02/02/2020  . Complicated UTI (urinary tract infection) 02/02/2020  . Ureteral stone with hydronephrosis 02/02/2020  . Leukocytosis 02/02/2020  . Dementia (HCC) 02/02/2020  . COPD (chronic obstructive pulmonary disease) (HCC) 02/02/2020    Orientation RESPIRATION BLADDER Height & Weight     Self,Place  Normal Incontinent Weight:   Height:  5\' 4"  (162.6 cm)  BEHAVIORAL SYMPTOMS/MOOD NEUROLOGICAL BOWEL NUTRITION STATUS      Incontinent Diet (Regular;finger foods)  AMBULATORY STATUS COMMUNICATION OF NEEDS Skin   Total Care Non-Verbally Normal,Surgical wounds (L ureteral stent site.)                       Personal Care Assistance Level of Assistance  Bathing,Dressing,Feeding Bathing Assistance: Limited assistance Feeding assistance: Limited assistance Dressing Assistance: Maximum assistance     Functional Limitations Info  Sight,Hearing,Speech Sight Info: Impaired (eyeglasses) Hearing Info: Adequate Speech Info: Impaired (non verbal)    SPECIAL CARE FACTORS FREQUENCY                       Contractures Contractures Info: Not present    Additional Factors Info  Code Status,Allergies Code Status  Info:  (Full code) Allergies Info:  (Penicillin,Penicillin G)           Current Medications (02/02/2020):  This is the current hospital active medication list Current Facility-Administered Medications  Medication Dose Route Frequency Provider Last Rate Last Admin  . acetaminophen (OFIRMEV) 10 MG/ML IV           . acetaminophen (TYLENOL) tablet 650 mg  650 mg Oral Q6H PRN Chotiner, 04/01/2020, MD       Or  . acetaminophen (TYLENOL) suppository 650 mg  650 mg Rectal Q6H PRN Chotiner, Claudean Severance, MD      . albuterol (PROVENTIL) (2.5 MG/3ML) 0.083% nebulizer solution 2.5 mg  2.5 mg Nebulization Q4H PRN Chotiner, Claudean Severance, MD      . apixaban (ELIQUIS) tablet 5 mg  5 mg Oral BID Chotiner, Claudean Severance, MD      . ceFEPIme (MAXIPIME) 2 g in sodium chloride 0.9 % 100 mL IVPB  2 g Intravenous Q12H Poindexter, Leann T, RPH      . lactated ringers infusion   Intravenous Continuous Claudean Severance, MD 150 mL/hr at 02/02/20 0401 Restarted at 02/02/20 0445  . lactated ringers infusion   Intravenous Continuous Chotiner, 04/01/20, MD      . levothyroxine (SYNTHROID) tablet 75 mcg  75 mcg Oral QAC breakfast Chotiner, Claudean Severance, MD      . mometasone-formoterol (DULERA) 100-5 MCG/ACT inhaler 2 puff  2 puff Inhalation BID Chotiner, Claudean Severance, MD      .  ondansetron (ZOFRAN) tablet 4 mg  4 mg Oral Q6H PRN Chotiner, Claudean Severance, MD       Or  . ondansetron (ZOFRAN) injection 4 mg  4 mg Intravenous Q6H PRN Chotiner, Claudean Severance, MD      . senna-docusate (Senokot-S) tablet 1 tablet  1 tablet Oral QHS PRN Chotiner, Claudean Severance, MD      . Melene Muller ON 02/03/2020] simvastatin (ZOCOR) tablet 10 mg  10 mg Oral QODAY Chotiner, Claudean Severance, MD      . tamsulosin (FLOMAX) capsule 0.4 mg  0.4 mg Oral QHS Chotiner, Claudean Severance, MD      . vancomycin (VANCOCIN) IVPB 1000 mg/200 mL premix  1,000 mg Intravenous Q24H Donell Beers, [facility]         Discharge Medications: Please see discharge summary for a list of discharge  medications.  Relevant Imaging Results:  Relevant Lab Results:   Additional Information ss#[id number]  Briant Angelillo, Olegario Messier, [place]

## 2020-02-02 NOTE — ED Provider Notes (Signed)
[place] DEPT Provider Note   CSN: [id number] Arrival date & time: 02/01/20  2158     History Chief Complaint  Patient presents with  . Code Sepsis    Jaclyn Weaver is a 87 y.o. female.  Patient from [facility], history of dementia reported to be at baseline currently, COPD not O2 dependent, HTN, HLD, presents with fever onset this afternoon, decreased oxygen saturations requiring 4 L O2 to maintain normal. Per report, she is currently being treated for UTI, and per chart review, on Keflex since evaluation 01/27/20 for UTI in the setting of left ureterolithiasis. Per daughter, this was changed to [facility] on urology follow up [facility]) and surgery is being planned.         Past Medical History:  Diagnosis Date  . Constipation    from facility paperwork  . COPD (chronic obstructive pulmonary disease) ([place])   . Dementia ([place])    from facility paperwork  . Dermatitis    from facility paperwork  . Hyperlipidemia    from facility paperwork  . Hypertension   . Osteoporosis    from facility paperwork  . Urinary incontinence    from facility paperwork    There are no problems to display for this patient.   History reviewed. No pertinent surgical history.   OB History   No obstetric history on file.     No family history on file.     Home Medications Prior to Admission medications   Medication Sig Start Date End Date Taking? Authorizing Provider  acetaminophen (TYLENOL) 325 MG tablet Take 650 mg by mouth every 4 (four) hours as needed (for pain or a temperature greater than 100 F).    [provider]  alendronate-cholecalciferol (FOSAMAX PLUS D) 70-2800 MG-UNIT tablet Take 1 tablet by mouth every 7 (seven) days. Take with a full glass of water on an empty stomach.    [provider]  apixaban (ELIQUIS) 5 MG TABS tablet Take 5 mg by mouth 2 (two) times daily.    [provider]  bisacodyl (DULCOLAX) 10  MG suppository Place 10 mg rectally daily as needed (for constipation- "if no BM by the 4th day").    [provider]  Calcium Carbonate-Vitamin D 600-400 MG-UNIT tablet Take 1 tablet by mouth daily.    [provider]  cephALEXin (KEFLEX) 500 MG capsule Take 1 capsule (500 mg total) by mouth 4 (four) times daily. 01/27/20   Pattricia Boss, MD  Cranberry 450 MG TABS Take 450 tablets by mouth daily.    [provider]  doxycycline (ADOXA) 100 MG tablet Take 100 mg by mouth 2 (two) times daily. 10/20/19   [provider]  fluticasone-salmeterol (ADVAIR HFA) 45-21 MCG/ACT inhaler Inhale 1 puff into the lungs 2 (two) times daily.    [provider]  ipratropium (ATROVENT) 0.06 % nasal spray Place 2 sprays into both nostrils in the morning and at bedtime.    [provider]  levothyroxine (SYNTHROID) 75 MCG tablet Take 75 mcg by mouth daily before breakfast.    [provider]  magnesium hydroxide (MILK OF MAGNESIA) 400 MG/5ML suspension Take 30 mLs by mouth daily as needed for mild constipation (if no BM in 3 days).    [provider]  Multiple Vitamins-Minerals (MULTIVITAMIN WITH MINERALS) tablet Take 1 tablet by mouth daily.    [provider]  predniSONE (DELTASONE) 5 MG tablet Take 5 mg by mouth daily with breakfast.  [provider]  senna (SENOKOT) 8.6 MG TABS tablet Take 1 tablet by mouth daily.    [provider]  simvastatin (ZOCOR) 10 MG tablet Take 10 mg by mouth every other day.    [provider]  tamsulosin (FLOMAX) 0.4 MG CAPS capsule Take 0.4 mg by mouth at bedtime.    [provider]    Allergies    Penicillin g and Penicillins  Review of Systems   Review of Systems  Unable to perform ROS: Dementia    Physical Exam Updated Vital Signs BP (!) 156/88   Pulse (!) 109   Temp (!) 100.7 F (38.2 C) (Rectal)   Resp (!) 27   SpO2 100%   Physical Exam Vitals and  nursing note reviewed.  Constitutional:      General: She is not in acute distress.    Appearance: She is well-developed and well-nourished. She is ill-appearing.  HENT:     Head: Normocephalic.     Comments: Aged bruise to left temporal region.    Mouth/Throat:     Mouth: Mucous membranes are dry.  Eyes:     Conjunctiva/sclera: Conjunctivae normal.  Cardiovascular:     Rate and Rhythm: Regular rhythm. Tachycardia present.     Heart sounds: Murmur heard.    Pulmonary:     Effort: Pulmonary effort is normal.     Breath sounds: Wheezing and rhonchi present.     Comments: Decreased air movement bilaterally. Abdominal:     General: Bowel sounds are normal. There is no distension.     Palpations: Abdomen is soft.     Tenderness: There is no abdominal tenderness. There is no guarding or rebound.  Musculoskeletal:        General: Normal range of motion.     Cervical back: Normal range of motion and neck supple.  Skin:    General: Skin is warm and dry.     Findings: No rash.     Comments: Small abrasion left elbow without surrounding redness. No active drainage. No swelling. Not apparently tender.  Neurological:     Comments: Non-verbal, withdraws from pain. Responds to verbal stimuli.  Psychiatric:        Mood and Affect: Mood and affect normal.     ED Results / Procedures / Treatments   Labs (all labs ordered are listed, but only abnormal results are displayed) Labs Reviewed  COMPREHENSIVE METABOLIC PANEL - Abnormal; Notable for the following components:      Result Value   Glucose, Bld 130 (*)    All other components within normal limits  CBC WITH DIFFERENTIAL/PLATELET - Abnormal; Notable for the following components:   WBC 17.5 (*)    Neutro Abs 14.6 (*)    Monocytes Absolute 1.1 (*)    Abs Immature Granulocytes 0.08 (*)    All other components within normal limits  URINALYSIS, ROUTINE W REFLEX MICROSCOPIC - Abnormal; Notable for the following components:   APPearance  HAZY (*)    Ketones, ur 20 (*)    Leukocytes,Ua MODERATE (*)    Bacteria, UA RARE (*)    All other components within normal limits  PROTIME-INR - Abnormal; Notable for the following components:   Prothrombin Time 15.8 (*)    INR 1.3 (*)    All other components within normal limits  RESP PANEL BY RT-PCR (FLU A&B, COVID) ARPGX2  CULTURE, BLOOD (ROUTINE X 2)  CULTURE, BLOOD (ROUTINE X 2)  URINE CULTURE  LACTIC ACID, PLASMA  LACTIC  ACID, PLASMA  APTT  BLOOD GAS, VENOUS  POC SARS CORONAVIRUS 2 AG -  ED   Results for orders placed or performed during the hospital encounter of 02/01/20  Resp Panel by RT-PCR (Flu A&B, Covid) In/Out Cath Urine   Specimen: In/Out Cath Urine; Nasopharyngeal(NP) swabs in vial transport medium  Result Value Ref Range   SARS Coronavirus 2 by RT PCR NEGATIVE NEGATIVE   Influenza A by PCR NEGATIVE NEGATIVE   Influenza B by PCR NEGATIVE NEGATIVE  Lactic acid, plasma  Result Value Ref Range   Lactic Acid, Venous 1.3 0.5 - 1.9 mmol/L  Lactic acid, plasma  Result Value Ref Range   Lactic Acid, Venous 1.1 0.5 - 1.9 mmol/L  Comprehensive metabolic panel  Result Value Ref Range   Sodium 140 135 - 145 mmol/L   Potassium 3.9 3.5 - 5.1 mmol/L   Chloride 106 98 - 111 mmol/L   CO2 26 22 - 32 mmol/L   Glucose, Bld 130 (H) 70 - 99 mg/dL   BUN 15 8 - 23 mg/dL   Creatinine, Ser 0.75 0.44 - 1.00 mg/dL   Calcium 9.2 8.9 - 10.3 mg/dL   Total Protein 6.6 6.5 - 8.1 g/dL   Albumin 3.7 3.5 - 5.0 g/dL   AST 19 15 - 41 U/L   ALT 19 0 - 44 U/L   Alkaline Phosphatase 45 38 - 126 U/L   Total Bilirubin 0.9 0.3 - 1.2 mg/dL   GFR, Estimated >60 >60 mL/min   Anion gap 8 5 - 15  CBC WITH DIFFERENTIAL  Result Value Ref Range   WBC 17.5 (H) 4.0 - 10.5 K/uL   RBC 4.28 3.87 - 5.11 MIL/uL   Hemoglobin 13.7 12.0 - 15.0 g/dL   HCT 42.7 36.0 - 46.0 %   MCV 99.8 80.0 - 100.0 fL   MCH 32.0 26.0 - 34.0 pg   MCHC 32.1 30.0 - 36.0 g/dL   RDW 12.8 11.5 - 15.5 %   Platelets 264 150 -  400 K/uL   nRBC 0.0 0.0 - 0.2 %   Neutrophils Relative % 84 %   Neutro Abs 14.6 (H) 1.7 - 7.7 K/uL   Lymphocytes Relative 7 %   Lymphs Abs 1.2 0.7 - 4.0 K/uL   Monocytes Relative 6 %   Monocytes Absolute 1.1 (H) 0.1 - 1.0 K/uL   Eosinophils Relative 2 %   Eosinophils Absolute 0.4 0.0 - 0.5 K/uL   Basophils Relative 0 %   Basophils Absolute 0.1 0.0 - 0.1 K/uL   Immature Granulocytes 1 %   Abs Immature Granulocytes 0.08 (H) 0.00 - 0.07 K/uL  Urinalysis, Routine w reflex microscopic Urine, Unspecified Source  Result Value Ref Range   Color, Urine YELLOW YELLOW   APPearance HAZY (A) CLEAR   Specific Gravity, Urine 1.017 1.005 - 1.030   pH 6.0 5.0 - 8.0   Glucose, UA NEGATIVE NEGATIVE mg/dL   Hgb urine dipstick NEGATIVE NEGATIVE   Bilirubin Urine NEGATIVE NEGATIVE   Ketones, ur 20 (A) NEGATIVE mg/dL   Protein, ur NEGATIVE NEGATIVE mg/dL   Nitrite NEGATIVE NEGATIVE   Leukocytes,Ua MODERATE (A) NEGATIVE   RBC / HPF 11-20 0 - 5 RBC/hpf   WBC, UA 21-50 0 - 5 WBC/hpf   Bacteria, UA RARE (A) NONE SEEN   Squamous Epithelial / LPF 0-5 0 - 5   Mucus PRESENT   Protime-INR  Result Value Ref Range   Prothrombin Time 15.8 (H) 11.4 - 15.2 seconds  INR 1.3 (H) 0.8 - 1.2  APTT  Result Value Ref Range   aPTT 32 24 - 36 seconds  Blood gas, venous  Result Value Ref Range   pH, Ven 7.316 7.250 - 7.430   pCO2, Ven 54.2 44.0 - 60.0 mmHg   pO2, Ven 37.9 32.0 - 45.0 mmHg   Bicarbonate 26.9 20.0 - 28.0 mmol/L   Acid-Base Excess 0.1 0.0 - 2.0 mmol/L   O2 Saturation 58.6 %   Patient temperature 98.6   POC SARS Coronavirus 2 Ag-ED - Nasal Swab (BD Veritor Kit)  Result Value Ref Range   SARS Coronavirus 2 Ag NEGATIVE NEGATIVE    EKG None  Radiology CT Angio Chest PE W and/or Wo Contrast  Result Date: 02/02/2020 CLINICAL DATA:  Fever, undergoing antibiotic therapy for urinary tract infection, COPD, wound drainage EXAM: CT ANGIOGRAPHY CHEST CT ABDOMEN AND PELVIS WITH CONTRAST TECHNIQUE:  Multidetector CT imaging of the chest was performed using the standard protocol during bolus administration of intravenous contrast. Multiplanar CT image reconstructions and MIPs were obtained to evaluate the vascular anatomy. Multidetector CT imaging of the abdomen and pelvis was performed using the standard protocol during bolus administration of intravenous contrast. CONTRAST:  14mL OMNIPAQUE IOHEXOL 350 MG/ML SOLN COMPARISON:  CT abdomen pelvis 01/27/2020 FINDINGS: CTA CHEST FINDINGS Cardiovascular: There is adequate opacification of the pulmonary arterial tree. No intraluminal filling defect to suggest acute pulmonary embolism. The central pulmonary arteries are of normal caliber. Mild coronary artery calcification. Global cardiac size within normal limits. No pericardial effusion. The thoracic aorta demonstrates mild atherosclerotic calcification. No aneurysm. Mediastinum/Nodes: Moderate hiatal hernia. Esophagus unremarkable. No pathologic thoracic adenopathy. Lungs/Pleura: Mild left basilar atelectasis. Lungs are otherwise clear. No pneumothorax or pleural effusion. There is central airway impaction involving the left lower lobe likely related to debris. No central obstructing mass lesion identified. Musculoskeletal: No acute bone abnormality. Review of the MIP images confirms the above findings. CT ABDOMEN and PELVIS FINDINGS Hepatobiliary: No focal liver abnormality is seen. No gallstones, gallbladder wall thickening, or biliary dilatation. Pancreas: Unremarkable Spleen: Unremarkable Adrenals/Urinary Tract: The adrenal glands are unremarkable. The kidneys are normal in size and position. Mild to moderate left hydronephrosis and mild perinephric stranding is again identified and appears unchanged with a an obstructing 7 mm calculus again identified within the left ureteropelvic junction. No additional nephro or urolithiasis. The right kidney is unremarkable. Layering hyperdense material within the bladder  lumen demonstrates an altered configuration when compared to prior examination and most likely represents layering calcific debris within the bladder lumen. The bladder is otherwise unremarkable. Stomach/Bowel: Mild sigmoid diverticulosis. Moderate stool throughout the colon. Broad-based small umbilical hernia contains a single wall of the mid transverse colon. No superimposed inflammatory change. The stomach, small bowel, and large bowel are otherwise unremarkable. Appendix normal. No free intraperitoneal gas or fluid. Vascular/Lymphatic: Mild aortoiliac atherosclerotic calcification. No aortic aneurysm. No pathologic adenopathy within the abdomen and pelvis. Reproductive: Status post hysterectomy. No adnexal masses. Other: Small stool within the rectal vault. The rectum is otherwise unremarkable. Small supraumbilical ventral hernia is again seen containing mesenteric fat Musculoskeletal: No acute bone abnormality. Degenerative changes noted within the lumbar spine. Review of the MIP images confirms the above findings. IMPRESSION: No pulmonary embolism.  No acute intrathoracic pathology identified. Persistent moderate left hydronephrosis secondary to an obstructing 7 mm calculus at the left ureteropelvic junction. This appears unchanged from prior examination. High density material within the bladder represents layering calcific debris. Small supraumbilical ventral hernia containing mesenteric  fat and umbilical hernia containing a single wall of unremarkable transverse colon. These are unchanged. Aortic Atherosclerosis (ICD10-I70.0). Electronically Signed   By: Fidela Salisbury MD   On: 02/02/2020 00:43   CT ABDOMEN PELVIS W CONTRAST  Result Date: 02/02/2020 CLINICAL DATA:  Fever, undergoing antibiotic therapy for urinary tract infection, COPD, wound drainage EXAM: CT ANGIOGRAPHY CHEST CT ABDOMEN AND PELVIS WITH CONTRAST TECHNIQUE: Multidetector CT imaging of the chest was performed using the standard protocol  during bolus administration of intravenous contrast. Multiplanar CT image reconstructions and MIPs were obtained to evaluate the vascular anatomy. Multidetector CT imaging of the abdomen and pelvis was performed using the standard protocol during bolus administration of intravenous contrast. CONTRAST:  20mL OMNIPAQUE IOHEXOL 350 MG/ML SOLN COMPARISON:  CT abdomen pelvis 01/27/2020 FINDINGS: CTA CHEST FINDINGS Cardiovascular: There is adequate opacification of the pulmonary arterial tree. No intraluminal filling defect to suggest acute pulmonary embolism. The central pulmonary arteries are of normal caliber. Mild coronary artery calcification. Global cardiac size within normal limits. No pericardial effusion. The thoracic aorta demonstrates mild atherosclerotic calcification. No aneurysm. Mediastinum/Nodes: Moderate hiatal hernia. Esophagus unremarkable. No pathologic thoracic adenopathy. Lungs/Pleura: Mild left basilar atelectasis. Lungs are otherwise clear. No pneumothorax or pleural effusion. There is central airway impaction involving the left lower lobe likely related to debris. No central obstructing mass lesion identified. Musculoskeletal: No acute bone abnormality. Review of the MIP images confirms the above findings. CT ABDOMEN and PELVIS FINDINGS Hepatobiliary: No focal liver abnormality is seen. No gallstones, gallbladder wall thickening, or biliary dilatation. Pancreas: Unremarkable Spleen: Unremarkable Adrenals/Urinary Tract: The adrenal glands are unremarkable. The kidneys are normal in size and position. Mild to moderate left hydronephrosis and mild perinephric stranding is again identified and appears unchanged with a an obstructing 7 mm calculus again identified within the left ureteropelvic junction. No additional nephro or urolithiasis. The right kidney is unremarkable. Layering hyperdense material within the bladder lumen demonstrates an altered configuration when compared to prior examination and  most likely represents layering calcific debris within the bladder lumen. The bladder is otherwise unremarkable. Stomach/Bowel: Mild sigmoid diverticulosis. Moderate stool throughout the colon. Broad-based small umbilical hernia contains a single wall of the mid transverse colon. No superimposed inflammatory change. The stomach, small bowel, and large bowel are otherwise unremarkable. Appendix normal. No free intraperitoneal gas or fluid. Vascular/Lymphatic: Mild aortoiliac atherosclerotic calcification. No aortic aneurysm. No pathologic adenopathy within the abdomen and pelvis. Reproductive: Status post hysterectomy. No adnexal masses. Other: Small stool within the rectal vault. The rectum is otherwise unremarkable. Small supraumbilical ventral hernia is again seen containing mesenteric fat Musculoskeletal: No acute bone abnormality. Degenerative changes noted within the lumbar spine. Review of the MIP images confirms the above findings. IMPRESSION: No pulmonary embolism.  No acute intrathoracic pathology identified. Persistent moderate left hydronephrosis secondary to an obstructing 7 mm calculus at the left ureteropelvic junction. This appears unchanged from prior examination. High density material within the bladder represents layering calcific debris. Small supraumbilical ventral hernia containing mesenteric fat and umbilical hernia containing a single wall of unremarkable transverse colon. These are unchanged. Aortic Atherosclerosis (ICD10-I70.0). Electronically Signed   By: Fidela Salisbury MD   On: 02/02/2020 00:43   DG Chest Port 1 View  Result Date: 02/01/2020 CLINICAL DATA:  Sepsis EXAM: PORTABLE CHEST 1 VIEW COMPARISON:  01/27/2020 FINDINGS: The heart size and mediastinal contours are within normal limits. Both lungs are clear. The visualized skeletal structures are unremarkable. IMPRESSION: No active disease. Electronically Signed   By: Cassandria Anger  Christa See MD   On: 02/01/2020 23:04   CT Head Wo  Contrast  Result Date: 01/27/2020 CLINICAL DATA:  Fall 1 week ago EXAM: CT HEAD WITHOUT CONTRAST TECHNIQUE: Contiguous axial images were obtained from the base of the skull through the vertex without intravenous contrast. COMPARISON:  01/22/2020 FINDINGS: Brain: There is atrophy and chronic small vessel disease changes. No acute intracranial abnormality. Specifically, no hemorrhage, hydrocephalus, mass lesion, acute infarction, or significant intracranial injury. Vascular: No hyperdense vessel or unexpected calcification. Skull: No acute calvarial abnormality. Sinuses/Orbits: No acute findings Other: Soft tissue swelling in the left forehead IMPRESSION: Atrophy, chronic microvascular disease. No acute intracranial abnormality. Electronically Signed   By: Rolm Baptise M.D.   On: 01/27/2020 14:30   CT Head Wo Contrast  Result Date: 01/22/2020 CLINICAL DATA:  Fall.  On Eliquis. EXAM: CT HEAD WITHOUT CONTRAST CT CERVICAL SPINE WITHOUT CONTRAST TECHNIQUE: Multidetector CT imaging of the head and cervical spine was performed following the standard protocol without intravenous contrast. Multiplanar CT image reconstructions of the cervical spine were also generated. COMPARISON:  10/26/2019 FINDINGS: CT HEAD FINDINGS Brain: There is no mass, hemorrhage or extra-axial collection. The size and configuration of the ventricles and extra-axial CSF spaces are normal. There is hypoattenuation of the periventricular white matter, most commonly indicating chronic ischemic microangiopathy. Vascular: Atherosclerotic calcification of the internal carotid arteries at the skull base. No abnormal hyperdensity of the major intracranial arteries or dural venous sinuses. Skull: Small left frontal scalp hematoma.  No skull fracture. Sinuses/Orbits: No fluid levels or advanced mucosal thickening of the visualized paranasal sinuses. No mastoid or middle ear effusion. The orbits are normal. CT CERVICAL SPINE FINDINGS Alignment: No static  subluxation. Facets are aligned. Occipital condyles are normally positioned. Skull base and vertebrae: No acute fracture. Soft tissues and spinal canal: No prevertebral fluid or swelling. No visible canal hematoma. Disc levels: Severe left uncovertebral degeneration at C1-2. Multilevel severe facet arthrosis. Mild spinal canal stenosis at C5-6. Bulky anterior osteophytes at C4-T1. Upper chest: No pneumothorax, pulmonary nodule or pleural effusion. Other: Normal visualized paraspinal cervical soft tissues. IMPRESSION: 1. Chronic ischemic microangiopathy without acute intracranial abnormality. 2. Small left frontal scalp hematoma without skull fracture. 3. No acute fracture or static subluxation of the cervical spine. Electronically Signed   By: Ulyses Jarred M.D.   On: 01/22/2020 21:13   CT Angio Chest PE W and/or Wo Contrast  Result Date: 02/02/2020 CLINICAL DATA:  Fever, undergoing antibiotic therapy for urinary tract infection, COPD, wound drainage EXAM: CT ANGIOGRAPHY CHEST CT ABDOMEN AND PELVIS WITH CONTRAST TECHNIQUE: Multidetector CT imaging of the chest was performed using the standard protocol during bolus administration of intravenous contrast. Multiplanar CT image reconstructions and MIPs were obtained to evaluate the vascular anatomy. Multidetector CT imaging of the abdomen and pelvis was performed using the standard protocol during bolus administration of intravenous contrast. CONTRAST:  47mL OMNIPAQUE IOHEXOL 350 MG/ML SOLN COMPARISON:  CT abdomen pelvis 01/27/2020 FINDINGS: CTA CHEST FINDINGS Cardiovascular: There is adequate opacification of the pulmonary arterial tree. No intraluminal filling defect to suggest acute pulmonary embolism. The central pulmonary arteries are of normal caliber. Mild coronary artery calcification. Global cardiac size within normal limits. No pericardial effusion. The thoracic aorta demonstrates mild atherosclerotic calcification. No aneurysm. Mediastinum/Nodes: Moderate  hiatal hernia. Esophagus unremarkable. No pathologic thoracic adenopathy. Lungs/Pleura: Mild left basilar atelectasis. Lungs are otherwise clear. No pneumothorax or pleural effusion. There is central airway impaction involving the left lower lobe likely related to debris. No central  obstructing mass lesion identified. Musculoskeletal: No acute bone abnormality. Review of the MIP images confirms the above findings. CT ABDOMEN and PELVIS FINDINGS Hepatobiliary: No focal liver abnormality is seen. No gallstones, gallbladder wall thickening, or biliary dilatation. Pancreas: Unremarkable Spleen: Unremarkable Adrenals/Urinary Tract: The adrenal glands are unremarkable. The kidneys are normal in size and position. Mild to moderate left hydronephrosis and mild perinephric stranding is again identified and appears unchanged with a an obstructing 7 mm calculus again identified within the left ureteropelvic junction. No additional nephro or urolithiasis. The right kidney is unremarkable. Layering hyperdense material within the bladder lumen demonstrates an altered configuration when compared to prior examination and most likely represents layering calcific debris within the bladder lumen. The bladder is otherwise unremarkable. Stomach/Bowel: Mild sigmoid diverticulosis. Moderate stool throughout the colon. Broad-based small umbilical hernia contains a single wall of the mid transverse colon. No superimposed inflammatory change. The stomach, small bowel, and large bowel are otherwise unremarkable. Appendix normal. No free intraperitoneal gas or fluid. Vascular/Lymphatic: Mild aortoiliac atherosclerotic calcification. No aortic aneurysm. No pathologic adenopathy within the abdomen and pelvis. Reproductive: Status post hysterectomy. No adnexal masses. Other: Small stool within the rectal vault. The rectum is otherwise unremarkable. Small supraumbilical ventral hernia is again seen containing mesenteric fat Musculoskeletal: No  acute bone abnormality. Degenerative changes noted within the lumbar spine. Review of the MIP images confirms the above findings. IMPRESSION: No pulmonary embolism.  No acute intrathoracic pathology identified. Persistent moderate left hydronephrosis secondary to an obstructing 7 mm calculus at the left ureteropelvic junction. This appears unchanged from prior examination. High density material within the bladder represents layering calcific debris. Small supraumbilical ventral hernia containing mesenteric fat and umbilical hernia containing a single wall of unremarkable transverse colon. These are unchanged. Aortic Atherosclerosis (ICD10-I70.0). Electronically Signed   By: Helyn Numbers MD   On: 02/02/2020 00:43   CT Cervical Spine Wo Contrast  Result Date: 01/22/2020 CLINICAL DATA:  Fall.  On Eliquis. EXAM: CT HEAD WITHOUT CONTRAST CT CERVICAL SPINE WITHOUT CONTRAST TECHNIQUE: Multidetector CT imaging of the head and cervical spine was performed following the standard protocol without intravenous contrast. Multiplanar CT image reconstructions of the cervical spine were also generated. COMPARISON:  10/26/2019 FINDINGS: CT HEAD FINDINGS Brain: There is no mass, hemorrhage or extra-axial collection. The size and configuration of the ventricles and extra-axial CSF spaces are normal. There is hypoattenuation of the periventricular white matter, most commonly indicating chronic ischemic microangiopathy. Vascular: Atherosclerotic calcification of the internal carotid arteries at the skull base. No abnormal hyperdensity of the major intracranial arteries or dural venous sinuses. Skull: Small left frontal scalp hematoma.  No skull fracture. Sinuses/Orbits: No fluid levels or advanced mucosal thickening of the visualized paranasal sinuses. No mastoid or middle ear effusion. The orbits are normal. CT CERVICAL SPINE FINDINGS Alignment: No static subluxation. Facets are aligned. Occipital condyles are normally  positioned. Skull base and vertebrae: No acute fracture. Soft tissues and spinal canal: No prevertebral fluid or swelling. No visible canal hematoma. Disc levels: Severe left uncovertebral degeneration at C1-2. Multilevel severe facet arthrosis. Mild spinal canal stenosis at C5-6. Bulky anterior osteophytes at C4-T1. Upper chest: No pneumothorax, pulmonary nodule or pleural effusion. Other: Normal visualized paraspinal cervical soft tissues. IMPRESSION: 1. Chronic ischemic microangiopathy without acute intracranial abnormality. 2. Small left frontal scalp hematoma without skull fracture. 3. No acute fracture or static subluxation of the cervical spine. Electronically Signed   By: Deatra Robinson M.D.   On: 01/22/2020 21:13   CT  ABDOMEN PELVIS W CONTRAST  Result Date: 02/02/2020 CLINICAL DATA:  Fever, undergoing antibiotic therapy for urinary tract infection, COPD, wound drainage EXAM: CT ANGIOGRAPHY CHEST CT ABDOMEN AND PELVIS WITH CONTRAST TECHNIQUE: Multidetector CT imaging of the chest was performed using the standard protocol during bolus administration of intravenous contrast. Multiplanar CT image reconstructions and MIPs were obtained to evaluate the vascular anatomy. Multidetector CT imaging of the abdomen and pelvis was performed using the standard protocol during bolus administration of intravenous contrast. CONTRAST:  91mL OMNIPAQUE IOHEXOL 350 MG/ML SOLN COMPARISON:  CT abdomen pelvis 01/27/2020 FINDINGS: CTA CHEST FINDINGS Cardiovascular: There is adequate opacification of the pulmonary arterial tree. No intraluminal filling defect to suggest acute pulmonary embolism. The central pulmonary arteries are of normal caliber. Mild coronary artery calcification. Global cardiac size within normal limits. No pericardial effusion. The thoracic aorta demonstrates mild atherosclerotic calcification. No aneurysm. Mediastinum/Nodes: Moderate hiatal hernia. Esophagus unremarkable. No pathologic thoracic adenopathy.  Lungs/Pleura: Mild left basilar atelectasis. Lungs are otherwise clear. No pneumothorax or pleural effusion. There is central airway impaction involving the left lower lobe likely related to debris. No central obstructing mass lesion identified. Musculoskeletal: No acute bone abnormality. Review of the MIP images confirms the above findings. CT ABDOMEN and PELVIS FINDINGS Hepatobiliary: No focal liver abnormality is seen. No gallstones, gallbladder wall thickening, or biliary dilatation. Pancreas: Unremarkable Spleen: Unremarkable Adrenals/Urinary Tract: The adrenal glands are unremarkable. The kidneys are normal in size and position. Mild to moderate left hydronephrosis and mild perinephric stranding is again identified and appears unchanged with a an obstructing 7 mm calculus again identified within the left ureteropelvic junction. No additional nephro or urolithiasis. The right kidney is unremarkable. Layering hyperdense material within the bladder lumen demonstrates an altered configuration when compared to prior examination and most likely represents layering calcific debris within the bladder lumen. The bladder is otherwise unremarkable. Stomach/Bowel: Mild sigmoid diverticulosis. Moderate stool throughout the colon. Broad-based small umbilical hernia contains a single wall of the mid transverse colon. No superimposed inflammatory change. The stomach, small bowel, and large bowel are otherwise unremarkable. Appendix normal. No free intraperitoneal gas or fluid. Vascular/Lymphatic: Mild aortoiliac atherosclerotic calcification. No aortic aneurysm. No pathologic adenopathy within the abdomen and pelvis. Reproductive: Status post hysterectomy. No adnexal masses. Other: Small stool within the rectal vault. The rectum is otherwise unremarkable. Small supraumbilical ventral hernia is again seen containing mesenteric fat Musculoskeletal: No acute bone abnormality. Degenerative changes noted within the lumbar spine.  Review of the MIP images confirms the above findings. IMPRESSION: No pulmonary embolism.  No acute intrathoracic pathology identified. Persistent moderate left hydronephrosis secondary to an obstructing 7 mm calculus at the left ureteropelvic junction. This appears unchanged from prior examination. High density material within the bladder represents layering calcific debris. Small supraumbilical ventral hernia containing mesenteric fat and umbilical hernia containing a single wall of unremarkable transverse colon. These are unchanged. Aortic Atherosclerosis (ICD10-I70.0). Electronically Signed   By: Fidela Salisbury MD   On: 02/02/2020 00:43   CT ABDOMEN PELVIS W CONTRAST  Result Date: 01/27/2020 CLINICAL DATA:  Abdominal distension.  Recent fall. EXAM: CT ABDOMEN AND PELVIS WITH CONTRAST TECHNIQUE: Multidetector CT imaging of the abdomen and pelvis was performed using the standard protocol following bolus administration of intravenous contrast. CONTRAST:  147mL OMNIPAQUE IOHEXOL 300 MG/ML  SOLN COMPARISON:  None. FINDINGS: Lower chest: No acute abnormality.  Moderate/large hiatal hernia. Hepatobiliary: No focal liver abnormality is seen. No gallstones, gallbladder wall thickening, or biliary dilatation. Pancreas: Unremarkable. No pancreatic ductal  dilatation or surrounding inflammatory changes. Spleen: Normal in size without focal abnormality. Adrenals/Urinary Tract: Mild to moderate left hydronephrosis. There is a 5 mm calculus located at the left ureteropelvic junction. Delayed imaging shows no significant delayed excretion of contrast into the left renal collecting system compared to the right. No other calculi identified. The adrenal glands are unremarkable. The bladder demonstrates a small area of increased density along the posterior left bladder wall near the left UVJ. This may relate to some focal hemorrhage in this region due to the UPJ calculus. Focal bladder neoplasm cannot be excluded. Stomach/Bowel:  Bowel shows no evidence of obstruction, ileus, inflammation or lesion. No free intraperitoneal air identified. Vascular/Lymphatic: Mild calcified plaque in the abdominal aorta without aneurysm. No enlarged lymph nodes are identified. Reproductive: Status post hysterectomy. No adnexal masses. Other: Small midline ventral hernia superior to the umbilicus contains fat. No evidence of subcutaneous or abdominal wall hemorrhage. No abscess or free fluid identified in the abdomen or pelvis. Musculoskeletal: No fracture or bony lesions identified. IMPRESSION: 1. 5 mm calculus at the left ureteropelvic junction causing mild to moderate left hydronephrosis. 2. Increased density along the posterior left bladder wall near the trigone/UVJ region may relate to focal hemorrhage or bladder lesion. 3. Small midline ventral hernia superior to the umbilicus containing fat. 4. Moderate to large hiatal hernia. Electronically Signed   By: Irish Lack M.D.   On: 01/27/2020 14:42   DG Chest Port 1 View  Result Date: 02/01/2020 CLINICAL DATA:  Sepsis EXAM: PORTABLE CHEST 1 VIEW COMPARISON:  01/27/2020 FINDINGS: The heart size and mediastinal contours are within normal limits. Both lungs are clear. The visualized skeletal structures are unremarkable. IMPRESSION: No active disease. Electronically Signed   By: Helyn Numbers MD   On: 02/01/2020 23:04   DG Chest Port 1 View  Result Date: 01/27/2020 CLINICAL DATA:  Abdominal pain EXAM: PORTABLE CHEST 1 VIEW COMPARISON:  None. FINDINGS: Mild hyperinflation. Heart and mediastinal contours are within normal limits. No focal opacities or effusions. No acute bony abnormality. IMPRESSION: No active cardiopulmonary disease. Electronically Signed   By: Charlett Nose M.D.   On: 01/27/2020 12:33    Procedures Procedures (including critical care time) CRITICAL CARE Performed by: Arnoldo Hooker   Total critical care time: 55 minutes  Critical care time was exclusive of separately  billable procedures and treating other patients.  Critical care was necessary to treat or prevent imminent or life-threatening deterioration.  Critical care was time spent personally by me on the following activities: development of treatment plan with patient and/or surrogate as well as nursing, discussions with consultants, evaluation of patient's response to treatment, examination of patient, obtaining history from patient or surrogate, ordering and performing treatments and interventions, ordering and review of laboratory studies, ordering and review of radiographic studies, pulse oximetry and re-evaluation of patient's condition.  Medications Ordered in ED Medications  lactated ringers infusion ( Intravenous New Bag/Given 02/01/20 2244)  lactated ringers bolus 500 mL (0 mLs Intravenous Stopped 02/01/20 2336)  vancomycin (VANCOCIN) IVPB 1000 mg/200 mL premix (0 mg Intravenous Stopped 02/01/20 2346)  ceFEPIme (MAXIPIME) 2 g in sodium chloride 0.9 % 100 mL IVPB (0 g Intravenous Stopped 02/01/20 2336)  albuterol (PROVENTIL) (2.5 MG/3ML) 0.083% nebulizer solution 5 mg (5 mg Nebulization Given 02/01/20 2325)  ipratropium (ATROVENT) nebulizer solution 0.5 mg (0.5 mg Nebulization Given 02/01/20 2325)  lactated ringers bolus 500 mL (0 mLs Intravenous Stopped 02/02/20 0021)  iohexol (OMNIPAQUE) 350 MG/ML injection 100 mL (80  mLs Intravenous Contrast Given 02/02/20 0012)    ED Course  I have reviewed the triage vital signs and the nursing notes.  Pertinent labs & imaging results that were available during my care of the patient were reviewed by me and considered in my medical decision making (see chart for details).    MDM Rules/Calculators/A&P                          Patient to ED from nursing home concerned for sepsis, reporting fever earlier today. REcent UTI, on Keflex. Dementia reported at baseline mental status.   Low grade temp on arrival 100.8. No reported antipyretics prior to arrival. Tachycardia,  tachypnea present. Code Sepsis orders placed. Vancomycin and Cefepime started. IVF's (LR 500 cc) bolused.  POC COVID performed and negative. Albuterol nebulizer provided with significant improvement in wheezing, however, still tachypneic. CXR negative. CTA r/o PE ordered. Will obtain abd/pel CT as well given recent findings of ureteral stone and UTI.   Daughter at bedside. Discussed code status which is confirmed to be [name].   CTA negative for PE. No PNA. Suspect fever, sepsis is due to urinary tract infection. Lactic acid is 1.3. UA is improved from 01/27/20 with rare bacteria, still significant leukocytes. CT c/w 7 mm proximal left ureteral stone with moderate hydro - unchanged.   Patient has failed outpatient therapy and now presents with sepsis in the setting of urinary stone. Urology paged. Plan to admit to hospitalist. Patient is on Eliquis. Unknown last dosing. Attempt to contact [facility] unsuccessful.   Discussed with urology, Dr. Abner Greenspan, who will plan to place stent tonight. Admit to hospitalist for management of sepsis, asthma exacerbation.  She is currently 2 L O2 with normal oxygen saturations. Decadron provided.    Final Clinical Impression(s) / ED Diagnoses Final diagnoses:  None   1. Sepsis 2. Asthma exacerbation 3. Obstructing uropathy  Rx / DC Orders ED Discharge Orders    None       Charlann Lange, PA-C 02/02/20 0301    Drenda Freeze, MD 02/05/20 [phone]

## 2020-02-02 NOTE — [place] FL2 (Signed)
MEDICAID FL2 LEVEL OF CARE SCREENING TOOL     IDENTIFICATION  Patient Name: Jaclyn Weaver Birthdate: [birth year] Sex: female Admission Date (Current Location): 02/01/2020  [facility] and [place] Number:  Producer, television/film/video and Address:  [facility]  [address], [place] [address]      Provider Number: [id number]  Attending Physician Name and Address:  Lewie Chamber, MD  Relative Name and Phone Number:  Rosalita Chessman dtr [phone]    Current Level of Care: Hospital Recommended Level of Care: Memory Care Prior Approval Number:    Date Approved/Denied:   PASRR Number:    Discharge Plan: Other (Comment) (return back to memory care unit)    Current Diagnoses: Patient Active Problem List   Diagnosis Date Noted  . Sepsis (HCC) 02/02/2020  . Complicated UTI (urinary tract infection) 02/02/2020  . Ureteral stone with hydronephrosis 02/02/2020  . Leukocytosis 02/02/2020  . Dementia (HCC) 02/02/2020  . COPD (chronic obstructive pulmonary disease) (HCC) 02/02/2020    Orientation RESPIRATION BLADDER Height & Weight     Self,Place  Normal Incontinent Weight:   Height:  5\' 4"  (162.6 cm)  BEHAVIORAL SYMPTOMS/MOOD NEUROLOGICAL BOWEL NUTRITION STATUS      Incontinent Diet (Regular;finger foods)  AMBULATORY STATUS COMMUNICATION OF NEEDS Skin   Total Care Non-Verbally Normal,Surgical wounds (L ureteral stent site.)                       Personal Care Assistance Level of Assistance  Bathing,Dressing,Feeding Bathing Assistance: Limited assistance Feeding assistance: Limited assistance Dressing Assistance: Maximum assistance     Functional Limitations Info  Sight,Hearing,Speech Sight Info: Impaired (eyeglasses) Hearing Info: Adequate Speech Info: Impaired (non verbal)    SPECIAL CARE FACTORS FREQUENCY                       Contractures Contractures Info: Not present    Additional Factors Info  Code Status,Allergies Code Status  Info:  (Full code) Allergies Info:  (Penicillin,Penicillin G)           Current Medications (02/02/2020):  This is the current hospital active medication list Current Facility-Administered Medications  Medication Dose Route Frequency Provider Last Rate Last Admin  . acetaminophen (OFIRMEV) 10 MG/ML IV           . acetaminophen (TYLENOL) tablet 650 mg  650 mg Oral Q6H PRN Chotiner, 04/01/2020, MD       Or  . acetaminophen (TYLENOL) suppository 650 mg  650 mg Rectal Q6H PRN Chotiner, Claudean Severance, MD      . albuterol (PROVENTIL) (2.5 MG/3ML) 0.083% nebulizer solution 2.5 mg  2.5 mg Nebulization Q4H PRN Chotiner, Claudean Severance, MD      . apixaban (ELIQUIS) tablet 5 mg  5 mg Oral BID Chotiner, Claudean Severance, MD      . ceFEPIme (MAXIPIME) 2 g in sodium chloride 0.9 % 100 mL IVPB  2 g Intravenous Q12H Poindexter, Leann T, RPH      . lactated ringers infusion   Intravenous Continuous Claudean Severance, MD 150 mL/hr at 02/02/20 0401 Restarted at 02/02/20 0445  . lactated ringers infusion   Intravenous Continuous Chotiner, 04/01/20, MD      . levothyroxine (SYNTHROID) tablet 75 mcg  75 mcg Oral QAC breakfast Chotiner, Claudean Severance, MD      . mometasone-formoterol (DULERA) 100-5 MCG/ACT inhaler 2 puff  2 puff Inhalation BID Chotiner, Claudean Severance, MD      .  ondansetron (ZOFRAN) tablet 4 mg  4 mg Oral Q6H PRN Chotiner, Bradley S, MD       Or  . ondansetron (ZOFRAN) injection 4 mg  4 mg Intravenous Q6H PRN Chotiner, Bradley S, MD      . senna-docusate (Senokot-S) tablet 1 tablet  1 tablet Oral QHS PRN Chotiner, Bradley S, MD      . [START ON 02/03/2020] simvastatin (ZOCOR) tablet 10 mg  10 mg Oral QODAY Chotiner, Bradley S, MD      . tamsulosin (FLOMAX) capsule 0.4 mg  0.4 mg Oral QHS Chotiner, Bradley S, MD      . vancomycin (VANCOCIN) IVPB 1000 mg/200 mL premix  1,000 mg Intravenous Q24H Poindexter, Leann T, RPH         Discharge Medications: Please see discharge summary for a list of discharge  medications.  Relevant Imaging Results:  Relevant Lab Results:   Additional Information ss#[id number]  Nyshawn Gowdy, RN    

## 2020-02-02 NOTE — Assessment & Plan Note (Signed)
-  See complicated UTI

## 2020-02-02 NOTE — Progress Notes (Signed)
PROGRESS NOTE    Jaclyn Weaver   WJX:[id number]  DOB: 01-31-1930  DOA: 02/01/2020     0  PCP: Jaclyn Loader, FNP  CC: fever  Hospital Course: Jaclyn Weaver is an 87 yo female with PMH severe dementia (mostly nonverbal), COPD, HTN, HLD, hypothyroidism, osteoporosis, history of blood clots (on Eliquis).  She was brought in from her SNF for new development of fever.  She had recently been treated with Zyvox p.o. after being seen in the ER on 01/28/2020 from a fall out of her wheelchair and urine culture then grew E faecalis. She had also underwent a CT head during that time which showed soft tissue swelling on her left forehead but no other obvious abnormality. During same work-up she was also found to have a 5 mm left ureteropelvic stone and was going to have outpatient intervention however due to her fever on presentation this hospitalization she underwent repeat CT abdomen/pelvis and this showed an obstructing 7 mm calculus at the left ureteropelvic junction.  She was taken to the OR and underwent stent placement in the left ureter.   Interval History:  Seen in her room this afternoon.  Patient underwent stent placement earlier this morning.  She is resting in bed curled up on her right side.  Her eyes are open and she is staring but is essentially nonverbal.  She did utter a few words but could not tell me her first name.  Did squeeze my fingers with her hand on command and moved her feet some.  Old records reviewed in assessment of this patient  ROS: Review of systems not obtained due to patient factors.  Severe dementia  Assessment & Plan: * Sepsis ([place]) -Tachycardic, tachypneic, leukocytosis; source considered urinary -See complicated UTI  Ureteral stone with hydronephrosis -See complicated UTI  Complicated UTI (urinary tract infection) -CT abdomen/pelvis this hospitalization shows left ureteropelvic stone measuring 7 mm with obstruction -Underwent left ureter stent placement  with urology on admission -Initially started on vancomycin -Had been on oral Zyvox prior to admission.  Will change back over to IV Zyvox at this time until swallow function can be evaluated; discussed with pharmacy -Follow-up pending cultures  Dementia ([place]) -Severe -Patient is nonverbal at baseline and only follows a few commands -SLP eval ordered prior to initiating diet or multiple oral medications  COPD (chronic obstructive pulmonary disease) ([place]) -Lungs clear.  No signs of exacerbation at this time    Antimicrobials: Cefepime 02/01/20 x 1 Vanc 02/01/20 x 1 Zyvox 02/01/20>>current    DVT prophylaxis: Eliquis Code Status: [name] Family Communication: none present Disposition Plan: Status is: Inpatient  Remains inpatient appropriate because:Ongoing diagnostic testing needed not appropriate for outpatient work up, IV treatments appropriate due to intensity of illness or inability to take PO and Inpatient level of care appropriate due to severity of illness   Dispo: The patient is from: SNF              Anticipated d/c is to: SNF              Anticipated d/c date is: 2 days              Patient currently is not medically stable to d/c.   Objective: Blood pressure 131/61, pulse 62, temperature 98.4 F (36.9 C), temperature source Oral, resp. rate 20, height _0  (1.626 m), SpO2 97 %.  Examination: General appearance: Pleasant elderly woman resting in bed in no distress and remains nonverbal and following only a  few commands Head: Left forehead swelling with bruising appreciated Eyes: Pupils equally round, EOMI Lungs: clear to auscultation bilaterally Heart: regular rate and rhythm and S1, S2 normal Abdomen: normal findings: bowel sounds normal and soft, non-tender Extremities: No edema Skin: mobility and turgor normal Neurologic: Moves all 4 extremities.  Follows some commands.  Consultants:   Urology  Procedures:   Left ureteral stent placement, 02/02/2020  Data  Reviewed: I have personally reviewed following labs and imaging studies Results for orders placed or performed during the hospital encounter of 02/01/20 (from the past 24 hour(s))  Lactic acid, plasma     Status: None   Collection Time: 02/01/20 10:35 PM  Result Value Ref Range   Lactic Acid, Venous 1.3 0.5 - 1.9 mmol/L  Comprehensive metabolic panel     Status: Abnormal   Collection Time: 02/01/20 10:35 PM  Result Value Ref Range   Sodium 140 135 - 145 mmol/L   Potassium 3.9 3.5 - 5.1 mmol/L   Chloride 106 98 - 111 mmol/L   CO2 26 22 - 32 mmol/L   Glucose, Bld 130 (H) 70 - 99 mg/dL   BUN 15 8 - 23 mg/dL   Creatinine, Ser 0.75 0.44 - 1.00 mg/dL   Calcium 9.2 8.9 - 10.3 mg/dL   Total Protein 6.6 6.5 - 8.1 g/dL   Albumin 3.7 3.5 - 5.0 g/dL   AST 19 15 - 41 U/L   ALT 19 0 - 44 U/L   Alkaline Phosphatase 45 38 - 126 U/L   Total Bilirubin 0.9 0.3 - 1.2 mg/dL   GFR, Estimated >60 >60 mL/min   Anion gap 8 5 - 15  CBC WITH DIFFERENTIAL     Status: Abnormal   Collection Time: 02/01/20 10:35 PM  Result Value Ref Range   WBC 17.5 (H) 4.0 - 10.5 K/uL   RBC 4.28 3.87 - 5.11 MIL/uL   Hemoglobin 13.7 12.0 - 15.0 g/dL   HCT 42.7 36.0 - 46.0 %   MCV 99.8 80.0 - 100.0 fL   MCH 32.0 26.0 - 34.0 pg   MCHC 32.1 30.0 - 36.0 g/dL   RDW 12.8 11.5 - 15.5 %   Platelets 264 150 - 400 K/uL   nRBC 0.0 0.0 - 0.2 %   Neutrophils Relative % 84 %   Neutro Abs 14.6 (H) 1.7 - 7.7 K/uL   Lymphocytes Relative 7 %   Lymphs Abs 1.2 0.7 - 4.0 K/uL   Monocytes Relative 6 %   Monocytes Absolute 1.1 (H) 0.1 - 1.0 K/uL   Eosinophils Relative 2 %   Eosinophils Absolute 0.4 0.0 - 0.5 K/uL   Basophils Relative 0 %   Basophils Absolute 0.1 0.0 - 0.1 K/uL   Immature Granulocytes 1 %   Abs Immature Granulocytes 0.08 (H) 0.00 - 0.07 K/uL  Protime-INR     Status: Abnormal   Collection Time: 02/01/20 10:35 PM  Result Value Ref Range   Prothrombin Time 15.8 (H) 11.4 - 15.2 seconds   INR 1.3 (H) 0.8 - 1.2  APTT      Status: None   Collection Time: 02/01/20 10:35 PM  Result Value Ref Range   aPTT 32 24 - 36 seconds  Blood gas, venous     Status: None   Collection Time: 02/01/20 10:39 PM  Result Value Ref Range   pH, Ven 7.316 7.250 - 7.430   pCO2, Ven 54.2 44.0 - 60.0 mmHg   pO2, Ven 37.9 32.0 - 45.0 mmHg  Bicarbonate 26.9 20.0 - 28.0 mmol/L   Acid-Base Excess 0.1 0.0 - 2.0 mmol/L   O2 Saturation 58.6 %   Patient temperature 98.6   POC SARS Coronavirus 2 Ag-ED - Nasal Swab (BD Veritor Kit)     Status: None   Collection Time: 02/01/20 10:58 PM  Result Value Ref Range   SARS Coronavirus 2 Ag NEGATIVE NEGATIVE  Resp Panel by RT-PCR (Flu A&B, Covid) In/Out Cath Urine     Status: None   Collection Time: 02/01/20 11:05 PM   Specimen: In/Out Cath Urine; Nasopharyngeal(NP) swabs in vial transport medium  Result Value Ref Range   SARS Coronavirus 2 by RT PCR NEGATIVE NEGATIVE   Influenza A by PCR NEGATIVE NEGATIVE   Influenza B by PCR NEGATIVE NEGATIVE  Urinalysis, Routine w reflex microscopic Urine, Unspecified Source     Status: Abnormal   Collection Time: 02/01/20 11:26 PM  Result Value Ref Range   Color, Urine YELLOW YELLOW   APPearance HAZY (A) CLEAR   Specific Gravity, Urine 1.017 1.005 - 1.030   pH 6.0 5.0 - 8.0   Glucose, UA NEGATIVE NEGATIVE mg/dL   Hgb urine dipstick NEGATIVE NEGATIVE   Bilirubin Urine NEGATIVE NEGATIVE   Ketones, ur 20 (A) NEGATIVE mg/dL   Protein, ur NEGATIVE NEGATIVE mg/dL   Nitrite NEGATIVE NEGATIVE   Leukocytes,Ua MODERATE (A) NEGATIVE   RBC / HPF 11-20 0 - 5 RBC/hpf   WBC, UA 21-50 0 - 5 WBC/hpf   Bacteria, UA RARE (A) NONE SEEN   Squamous Epithelial / LPF 0-5 0 - 5   Mucus PRESENT   Lactic acid, plasma     Status: None   Collection Time: 02/02/20 12:35 AM  Result Value Ref Range   Lactic Acid, Venous 1.1 0.5 - 1.9 mmol/L  Protime-INR     Status: Abnormal   Collection Time: 02/02/20  7:51 AM  Result Value Ref Range   Prothrombin Time 16.0 (H) 11.4 - 15.2  seconds   INR 1.3 (H) 0.8 - 1.2  Procalcitonin     Status: None   Collection Time: 02/02/20  7:51 AM  Result Value Ref Range   Procalcitonin <0.10 ng/mL  CBC     Status: Abnormal   Collection Time: 02/02/20  7:51 AM  Result Value Ref Range   WBC 13.0 (H) 4.0 - 10.5 K/uL   RBC 3.87 3.87 - 5.11 MIL/uL   Hemoglobin 12.6 12.0 - 15.0 g/dL   HCT 38.5 36.0 - 46.0 %   MCV 99.5 80.0 - 100.0 fL   MCH 32.6 26.0 - 34.0 pg   MCHC 32.7 30.0 - 36.0 g/dL   RDW 12.7 11.5 - 15.5 %   Platelets 208 150 - 400 K/uL   nRBC 0.0 0.0 - 0.2 %  Basic metabolic panel     Status: Abnormal   Collection Time: 02/02/20  7:51 AM  Result Value Ref Range   Sodium 140 135 - 145 mmol/L   Potassium 3.8 3.5 - 5.1 mmol/L   Chloride 108 98 - 111 mmol/L   CO2 24 22 - 32 mmol/L   Glucose, Bld 162 (H) 70 - 99 mg/dL   BUN 12 8 - 23 mg/dL   Creatinine, Ser 0.65 0.44 - 1.00 mg/dL   Calcium 9.0 8.9 - 10.3 mg/dL   GFR, Estimated >60 >60 mL/min   Anion gap 8 5 - 15    Recent Results (from the past 240 hour(s))  Resp Panel by RT-PCR (Flu A&B, Covid) Nasopharyngeal Swab  Status: None   Collection Time: 01/27/20  1:08 PM   Specimen: Nasopharyngeal Swab; Nasopharyngeal(NP) swabs in vial transport medium  Result Value Ref Range Status   SARS Coronavirus 2 by RT PCR NEGATIVE NEGATIVE Final    Comment: (NOTE) SARS-CoV-2 target nucleic acids are NOT DETECTED.  The SARS-CoV-2 RNA is generally detectable in upper respiratory specimens during the acute phase of infection. The lowest concentration of SARS-CoV-2 viral copies this assay can detect is 138 copies/mL. A negative result does not preclude SARS-Cov-2 infection and should not be used as the sole basis for treatment or other patient management decisions. A negative result may occur with  improper specimen collection/handling, submission of specimen other than nasopharyngeal swab, presence of viral mutation(s) within the areas targeted by this assay, and inadequate  number of viral copies(<138 copies/mL). A negative result must be combined with clinical observations, patient history, and epidemiological information. The expected result is Negative.  Fact Sheet for Patients:  EntrepreneurPulse.com.au  Fact Sheet for Healthcare Providers:  IncredibleEmployment.be  This test is no t yet approved or cleared by the [place] FDA and  has been authorized for detection and/or diagnosis of SARS-CoV-2 by FDA under an Emergency Use Authorization (EUA). This EUA will remain  in effect (meaning this test can be used) for the duration of the COVID-19 declaration under Section 564(b)(1) of the Act, 21 U.S.C.section 360bbb-3(b)(1), unless the authorization is terminated  or revoked sooner.       Influenza A by PCR NEGATIVE NEGATIVE Final   Influenza B by PCR NEGATIVE NEGATIVE Final    Comment: (NOTE) The Xpert Xpress SARS-CoV-2/FLU/RSV plus assay is intended as an aid in the diagnosis of influenza from Nasopharyngeal swab specimens and should not be used as a sole basis for treatment. Nasal washings and aspirates are unacceptable for Xpert Xpress SARS-CoV-2/FLU/RSV testing.  Fact Sheet for Patients: EntrepreneurPulse.com.au  Fact Sheet for Healthcare Providers: IncredibleEmployment.be  This test is not yet approved or cleared by the [place] FDA and has been authorized for detection and/or diagnosis of SARS-CoV-2 by FDA under an Emergency Use Authorization (EUA). This EUA will remain in effect (meaning this test can be used) for the duration of the COVID-19 declaration under Section 564(b)(1) of the Act, 21 U.S.C. section 360bbb-3(b)(1), unless the authorization is terminated or revoked.  Performed at [facility], [place] [address], [place], [place] [address]   Urine Culture     Status: Abnormal   Collection Time: 01/27/20  1:08 PM   Specimen: Urine,  Catheterized  Result Value Ref Range Status   Specimen Description   Final    URINE, CATHETERIZED Performed at [facility] [address] [place], [place] [address]    Special Requests   Final    NONE Performed at [facility], [place] [address], [place], [place] [address]    Culture >=100,000 COLONIES/mL ENTEROCOCCUS FAECALIS (A)  Final   Report Status 01/29/2020 FINAL  Final   Organism ID, Bacteria ENTEROCOCCUS FAECALIS (A)  Final      Susceptibility   Enterococcus faecalis - MIC*    AMPICILLIN <=2 SENSITIVE Sensitive     NITROFURANTOIN <=16 SENSITIVE Sensitive     VANCOMYCIN 1 SENSITIVE Sensitive     * >=100,000 COLONIES/mL ENTEROCOCCUS FAECALIS  Resp Panel by RT-PCR (Flu A&B, Covid) In/Out Cath Urine     Status: None   Collection Time: 02/01/20 11:05 PM   Specimen: In/Out Cath Urine; Nasopharyngeal(NP) swabs in vial transport medium  Result Value Ref Range Status   SARS Coronavirus 2 by RT PCR NEGATIVE NEGATIVE Final    Comment: (NOTE) SARS-CoV-2 target nucleic acids are NOT DETECTED.  The SARS-CoV-2 RNA is generally detectable in upper respiratory specimens during the acute phase of infection. The lowest concentration of SARS-CoV-2 viral copies this assay can detect is 138 copies/mL. A negative result does not preclude SARS-Cov-2 infection and should not be used as the sole basis for treatment or other patient management decisions. A negative result may occur with  improper specimen collection/handling, submission of specimen other than nasopharyngeal swab, presence of viral mutation(s) within the areas targeted by this assay, and inadequate number of viral copies(<138 copies/mL). A negative result must be combined with clinical observations, patient history, and epidemiological information. The expected result is Negative.  Fact Sheet for Patients:  EntrepreneurPulse.com.au  Fact Sheet for Healthcare Providers:   IncredibleEmployment.be  This test is no t yet approved or cleared by the [place] FDA and  has been authorized for detection and/or diagnosis of SARS-CoV-2 by FDA under an Emergency Use Authorization (EUA). This EUA will remain  in effect (meaning this test can be used) for the duration of the COVID-19 declaration under Section 564(b)(1) of the Act, 21 U.S.C.section 360bbb-3(b)(1), unless the authorization is terminated  or revoked sooner.       Influenza A by PCR NEGATIVE NEGATIVE Final   Influenza B by PCR NEGATIVE NEGATIVE Final    Comment: (NOTE) The Xpert Xpress SARS-CoV-2/FLU/RSV plus assay is intended as an aid in the diagnosis of influenza from Nasopharyngeal swab specimens and should not be used as a sole basis for treatment. Nasal washings and aspirates are unacceptable for Xpert Xpress SARS-CoV-2/FLU/RSV testing.  Fact Sheet for Patients: EntrepreneurPulse.com.au  Fact Sheet for Healthcare Providers: IncredibleEmployment.be  This test is not yet approved or cleared by the [place] FDA and has been authorized for detection and/or diagnosis of SARS-CoV-2 by FDA under an Emergency Use Authorization (EUA). This EUA will remain in effect (meaning this test can be used) for the duration of the COVID-19 declaration under Section 564(b)(1) of the Act, 21 U.S.C. section 360bbb-3(b)(1), unless the authorization is terminated or revoked.  Performed at [facility], [place] [address] [place], [place] [address]      Radiology Studies: CT Angio Chest PE W and/or Wo Contrast  Result Date: 02/02/2020 CLINICAL DATA:  Fever, undergoing antibiotic therapy for urinary tract infection, COPD, wound drainage EXAM: CT ANGIOGRAPHY CHEST CT ABDOMEN AND PELVIS WITH CONTRAST TECHNIQUE: Multidetector CT imaging of the chest was performed using the standard protocol during bolus administration of intravenous  contrast. Multiplanar CT image reconstructions and MIPs were obtained to evaluate the vascular anatomy. Multidetector CT imaging of the abdomen and pelvis was performed using the standard protocol during bolus administration of intravenous contrast. CONTRAST:  16m OMNIPAQUE IOHEXOL 350 MG/ML SOLN COMPARISON:  CT abdomen pelvis 01/27/2020 FINDINGS: CTA CHEST FINDINGS Cardiovascular: There is adequate opacification of the pulmonary arterial tree. No intraluminal filling defect to suggest acute pulmonary embolism. The central pulmonary arteries are of normal caliber. Mild coronary artery calcification. Global cardiac size within normal limits. No pericardial effusion. The thoracic aorta demonstrates mild atherosclerotic calcification. No aneurysm. Mediastinum/Nodes: Moderate hiatal hernia. Esophagus unremarkable. No pathologic thoracic adenopathy. Lungs/Pleura: Mild left basilar atelectasis. Lungs are otherwise clear. No pneumothorax or pleural effusion. There is central airway impaction involving the left lower lobe likely related to debris. No central obstructing mass lesion identified. Musculoskeletal: No acute bone  abnormality. Review of the MIP images confirms the above findings. CT ABDOMEN and PELVIS FINDINGS Hepatobiliary: No focal liver abnormality is seen. No gallstones, gallbladder wall thickening, or biliary dilatation. Pancreas: Unremarkable Spleen: Unremarkable Adrenals/Urinary Tract: The adrenal glands are unremarkable. The kidneys are normal in size and position. Mild to moderate left hydronephrosis and mild perinephric stranding is again identified and appears unchanged with a an obstructing 7 mm calculus again identified within the left ureteropelvic junction. No additional nephro or urolithiasis. The right kidney is unremarkable. Layering hyperdense material within the bladder lumen demonstrates an altered configuration when compared to prior examination and most likely represents layering calcific  debris within the bladder lumen. The bladder is otherwise unremarkable. Stomach/Bowel: Mild sigmoid diverticulosis. Moderate stool throughout the colon. Broad-based small umbilical hernia contains a single wall of the mid transverse colon. No superimposed inflammatory change. The stomach, small bowel, and large bowel are otherwise unremarkable. Appendix normal. No free intraperitoneal gas or fluid. Vascular/Lymphatic: Mild aortoiliac atherosclerotic calcification. No aortic aneurysm. No pathologic adenopathy within the abdomen and pelvis. Reproductive: Status post hysterectomy. No adnexal masses. Other: Small stool within the rectal vault. The rectum is otherwise unremarkable. Small supraumbilical ventral hernia is again seen containing mesenteric fat Musculoskeletal: No acute bone abnormality. Degenerative changes noted within the lumbar spine. Review of the MIP images confirms the above findings. IMPRESSION: No pulmonary embolism.  No acute intrathoracic pathology identified. Persistent moderate left hydronephrosis secondary to an obstructing 7 mm calculus at the left ureteropelvic junction. This appears unchanged from prior examination. High density material within the bladder represents layering calcific debris. Small supraumbilical ventral hernia containing mesenteric fat and umbilical hernia containing a single wall of unremarkable transverse colon. These are unchanged. Aortic Atherosclerosis (ICD10-I70.0). Electronically Signed   By: Fidela Salisbury MD   On: 02/02/2020 00:43   CT ABDOMEN PELVIS W CONTRAST  Result Date: 02/02/2020 CLINICAL DATA:  Fever, undergoing antibiotic therapy for urinary tract infection, COPD, wound drainage EXAM: CT ANGIOGRAPHY CHEST CT ABDOMEN AND PELVIS WITH CONTRAST TECHNIQUE: Multidetector CT imaging of the chest was performed using the standard protocol during bolus administration of intravenous contrast. Multiplanar CT image reconstructions and MIPs were obtained to evaluate  the vascular anatomy. Multidetector CT imaging of the abdomen and pelvis was performed using the standard protocol during bolus administration of intravenous contrast. CONTRAST:  38m OMNIPAQUE IOHEXOL 350 MG/ML SOLN COMPARISON:  CT abdomen pelvis 01/27/2020 FINDINGS: CTA CHEST FINDINGS Cardiovascular: There is adequate opacification of the pulmonary arterial tree. No intraluminal filling defect to suggest acute pulmonary embolism. The central pulmonary arteries are of normal caliber. Mild coronary artery calcification. Global cardiac size within normal limits. No pericardial effusion. The thoracic aorta demonstrates mild atherosclerotic calcification. No aneurysm. Mediastinum/Nodes: Moderate hiatal hernia. Esophagus unremarkable. No pathologic thoracic adenopathy. Lungs/Pleura: Mild left basilar atelectasis. Lungs are otherwise clear. No pneumothorax or pleural effusion. There is central airway impaction involving the left lower lobe likely related to debris. No central obstructing mass lesion identified. Musculoskeletal: No acute bone abnormality. Review of the MIP images confirms the above findings. CT ABDOMEN and PELVIS FINDINGS Hepatobiliary: No focal liver abnormality is seen. No gallstones, gallbladder wall thickening, or biliary dilatation. Pancreas: Unremarkable Spleen: Unremarkable Adrenals/Urinary Tract: The adrenal glands are unremarkable. The kidneys are normal in size and position. Mild to moderate left hydronephrosis and mild perinephric stranding is again identified and appears unchanged with a an obstructing 7 mm calculus again identified within the left ureteropelvic junction. No additional nephro or urolithiasis. The right kidney  is unremarkable. Layering hyperdense material within the bladder lumen demonstrates an altered configuration when compared to prior examination and most likely represents layering calcific debris within the bladder lumen. The bladder is otherwise unremarkable.  Stomach/Bowel: Mild sigmoid diverticulosis. Moderate stool throughout the colon. Broad-based small umbilical hernia contains a single wall of the mid transverse colon. No superimposed inflammatory change. The stomach, small bowel, and large bowel are otherwise unremarkable. Appendix normal. No free intraperitoneal gas or fluid. Vascular/Lymphatic: Mild aortoiliac atherosclerotic calcification. No aortic aneurysm. No pathologic adenopathy within the abdomen and pelvis. Reproductive: Status post hysterectomy. No adnexal masses. Other: Small stool within the rectal vault. The rectum is otherwise unremarkable. Small supraumbilical ventral hernia is again seen containing mesenteric fat Musculoskeletal: No acute bone abnormality. Degenerative changes noted within the lumbar spine. Review of the MIP images confirms the above findings. IMPRESSION: No pulmonary embolism.  No acute intrathoracic pathology identified. Persistent moderate left hydronephrosis secondary to an obstructing 7 mm calculus at the left ureteropelvic junction. This appears unchanged from prior examination. High density material within the bladder represents layering calcific debris. Small supraumbilical ventral hernia containing mesenteric fat and umbilical hernia containing a single wall of unremarkable transverse colon. These are unchanged. Aortic Atherosclerosis (ICD10-I70.0). Electronically Signed   By: Fidela Salisbury MD   On: 02/02/2020 00:43   DG Chest Port 1 View  Result Date: 02/01/2020 CLINICAL DATA:  Sepsis EXAM: PORTABLE CHEST 1 VIEW COMPARISON:  01/27/2020 FINDINGS: The heart size and mediastinal contours are within normal limits. Both lungs are clear. The visualized skeletal structures are unremarkable. IMPRESSION: No active disease. Electronically Signed   By: Fidela Salisbury MD   On: 02/01/2020 23:04   DG C-Arm 1-60 Min-No Report  Result Date: 02/02/2020 Fluoroscopy was utilized by the requesting physician.  No radiographic  interpretation.   DG C-Arm 1-60 Min-No Report  Final Result    CT Angio Chest PE W and/or Wo Contrast  Final Result    CT ABDOMEN PELVIS W CONTRAST  Final Result    DG Chest Port 1 View  Final Result      Scheduled Meds: . apixaban  5 mg Oral BID  . levothyroxine  75 mcg Oral QAC breakfast  . mometasone-formoterol  2 puff Inhalation BID  . [START ON 02/03/2020] simvastatin  10 mg Oral QODAY  . tamsulosin  0.4 mg Oral QHS   PRN Meds: acetaminophen **OR** acetaminophen, albuterol, ondansetron **OR** ondansetron (ZOFRAN) IV, senna-docusate Continuous Infusions: . acetaminophen    . lactated ringers 150 mL/hr at 02/02/20 0401  . lactated ringers    . linezolid (ZYVOX) IV       LOS: 0 days  Time spent: Greater than 50% of the 35 minute visit was spent in counseling/coordination of care for the patient as laid out in the A&P.   Dwyane Dee, MD [facility] 02/02/2020, 2:13 PM

## 2020-02-02 NOTE — Assessment & Plan Note (Signed)
-  Lungs clear.  No signs of exacerbation at this time
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
## 2020-02-02 NOTE — Discharge Instructions (Signed)
You have a left ureteral stent in place. This is temporary and will remain in place until treating the UPJ stone.

## 2020-02-02 NOTE — Anesthesia Procedure Notes (Signed)
Procedure Name: Intubation Date/Time: 02/02/2020 4:10 AM Performed by: Lissa Morales, CRNA Pre-anesthesia Checklist: Patient identified, Emergency Drugs available, Suction available and Patient being monitored Patient Re-evaluated:Patient Re-evaluated prior to induction Oxygen Delivery Method: Circle system utilized Preoxygenation: Pre-oxygenation with 100% oxygen Induction Type: IV induction Ventilation: Mask ventilation without difficulty Laryngoscope Size: Mac and 4 Grade View: Grade I Tube type: Oral Tube size: 7.5 mm Number of attempts: 1 Airway Equipment and Method: Stylet and Oral airway Placement Confirmation: ETT inserted through vocal cords under direct vision,  positive ETCO2 and breath sounds checked- equal and bilateral Secured at: 22 cm Tube secured with: Tape Dental Injury: Teeth and Oropharynx as per pre-operative assessment

## 2020-02-02 NOTE — TOC Initial Note (Signed)
Transition of Care [facility]) - Initial/Assessment Note    Patient Details  Name: Jaclyn Weaver MRN: [id number] Date of Birth: [birth year]  [facility] CM/SW Contact:    Lanier Clam, RN Phone Number: 02/02/2020, 1:02 PM  Clinical Narrative:  Confirmed w/dtr Rosalita Chessman, & [facility]- rep Jennifer-memory care-w/c bound,non verbal. D/c plan to return back.fl2 faxed to [facility] [phone]. PTAR @ d/c.                Expected Discharge Plan: Memory Care Barriers to Discharge: Continued Medical Work up   Patient Goals and CMS Choice Patient states their goals for this hospitalization and ongoing recovery are:: return back to [facility] Medicare.gov Compare Post Acute Care list provided to:: Patient Represenative (must comment) Choice offered to / list presented to : Adult Children Rosalita Chessman dtr [phone])  Expected Discharge Plan and Services Expected Discharge Plan: Memory Care   Discharge Planning Services: CM Consult   Living arrangements for the past 2 months: Post-Acute Facility                                      Prior Living Arrangements/Services Living arrangements for the past 2 months: Post-Acute Facility Lives with:: Adult Children Patient language and need for interpreter reviewed:: Yes Do you feel safe going back to the place where you live?: Yes      Need for Family Participation in Patient Care: No (Comment) Care giver support system in place?: Yes (comment)   Criminal Activity/Legal Involvement Pertinent to Current Situation/Hospitalization: No - Comment as needed  Activities of Daily Living Home Assistive Devices/Equipment: Blood pressure cuff,Hospital bed,Grab bars around toilet,Grab bars in shower,Hand-held shower hose,Wheelchair ADL Screening (condition at time of admission) Patient's cognitive ability adequate to safely complete daily activities?: No Is the patient deaf or have difficulty hearing?:  Yes Does the patient have difficulty seeing, even when wearing glasses/contacts?: No Does the patient have difficulty concentrating, remembering, or making decisions?: Yes Patient able to express need for assistance with ADLs?: No Does the patient have difficulty dressing or bathing?: Yes Independently performs ADLs?: No Communication: Appropriate for developmental age (will speak out sometimes) Dressing (OT): Dependent Is this a change from baseline?: Pre-admission baseline Grooming: Dependent Is this a change from baseline?: Pre-admission baseline Feeding: Dependent Is this a change from baseline?: Pre-admission baseline Bathing: Dependent Is this a change from baseline?: Pre-admission baseline Toileting: Dependent Is this a change from baseline?: Pre-admission baseline In/Out Bed: Needs assistance Is this a change from baseline?: Pre-admission baseline Walks in Home: Dependent (wheelchair bound) Is this a change from baseline?: Pre-admission baseline Does the patient have difficulty walking or climbing stairs?: Yes (secondary to weakness) Weakness of Legs: Both Weakness of Arms/Hands: None  Permission Sought/Granted Permission sought to share information with : Case Manager Permission granted to share information with : Yes, Verbal Permission Granted  Share Information with NAME: Case Manager     Permission granted to share info w Relationship: Rosalita Chessman dtr [phone]     Emotional Assessment Appearance:: Appears stated age Attitude/Demeanor/Rapport: Gracious Affect (typically observed): Accepting Orientation: : Oriented to Self,Oriented to Place Alcohol / Substance Use: Not Applicable Psych Involvement: No (comment)  Admission diagnosis:  Ureterolithiasis [N20.1] Lower urinary tract infectious disease [N39.0] Moderate persistent asthma with exacerbation [J45.41] Sepsis (HCC) [A41.9] Sepsis without acute organ dysfunction, due to unspecified organism [facility])  [A41.9] Patient Active  Problem List   Diagnosis Date Noted  . Sepsis (HCC) 02/02/2020  . Complicated UTI (urinary tract infection) 02/02/2020  . Ureteral stone with hydronephrosis 02/02/2020  . Leukocytosis 02/02/2020  . Dementia (HCC) 02/02/2020  . COPD (chronic obstructive pulmonary disease) (HCC) 02/02/2020   PCP:  Soundra Pilon, FNP Pharmacy:   [facility] [place], [place] - [address] [facility] [place]. [address] [place] [address] Phone: [phone] Fax: [phone]     Social Determinants of Health (SDOH) Interventions    Readmission Risk Interventions No flowsheet data found.

## 2020-02-02 NOTE — Op Note (Signed)
Operative Note  Preoperative diagnosis:  1.  Left UPJ stone 2. UTI  Postoperative diagnosis: 1.  Left UPJ stone 2. UTI  Procedure(s): 1.  Cystoscopy 2. Left retrograde pyelogram with interpretation 3. Left ureteral stent placement  Surgeon: Jettie Pagan, MD  Assistants:  None  Anesthesia:  General  Complications:  None  EBL:  minimal  Specimens: 1.  ID Type Source Tests Collected by Time Destination  A : Left Renal Pelvis Urine Culture Urine Urine, Cystoscope URINE CULTURE Jannifer Hick, MD 02/02/2020 [phone]      Drains/Catheters: 1.  Left 6Fr x 26cm ureteral stent  Intraoperative findings:   1. Cystoscopy demonstrated numerous tiny bladder stones and debris.  The stones and debris were evacuated.  Systemic evaluation of the bladder revealed no suspicious lesions, masses or other unusual pathology.  Her bilateral ureteral orifices were in their normal orthotopic expected location. 2. Retrograde pyelogram demonstrated severe left hydronephrosis. 3. Successful left ureteral stent placement with curl in the renal pelvis and bladder respectively.  Indication:  Jaclyn Weaver is a 87 y.o. female with a 7 mm left UPJ stone with signs of infection and early sepsis.  After reviewing the management options for treatment, the patient's daughter who is her POA elected to proceed with the above surgical procedure(s). We have discussed the potential benefits and risks of the procedure, side effects of the proposed treatment, the likelihood of the patient achieving the goals of the procedure, and any potential problems that might occur during the procedure or recuperation. Informed consent has been obtained.  Description of procedure: The patient was taken to the operating room and general anesthesia was induced.  The patient was placed in the dorsal lithotomy position, prepped and draped in the usual sterile fashion, and preoperative antibiotics were administered. A preoperative time-out  was performed.   Cystourethroscopy was performed.  The patient's urethra was examined and was normal. The bladder was then systematically examined in its entirety.  There were numerous small bladder stones and debris layering in the bladder.  All of these stones and debris were evacuated.  There was no evidence for any bladder tumors or other mucosal pathology.   Scout image identified retained contrast in her severely dilated left collecting system from prior CT scan.  An approximately 7 mm calcification was identified at the left UPJ.  Attention then turned to the left ureteral orifice. A 0.038 zip wire was passed through the left orifice and over the wire a 5 Fr open ended catheter was inserted and passed up to the level of the renal pelvis. Aspirate was obtained and sent off as left renal pelvis urine for culture. Retrograde pyelogram was performed with findings as dictated above. The wire was then replaced and the open ended catheter was removed.   A 6Fr x 26cm ureteral stent was advance over the wire. The stent was positioned appropriately under fluoroscopic and cystoscopic guidance.  The wire was then removed with an adequate stent curl noted in the upper pole as well as in the bladder.  The bladder was then emptied and the procedure ended.  The patient appeared to tolerate the procedure well and without complications.  The patient was able to be awakened and transferred to the recovery unit in satisfactory condition.   Plan: Follow-up left renal pelvis urine culture.  Continue broad-spectrum antibiotics.  She will follow-up outpatient with Dr. Berneice Heinrich to treat her left UPJ stone.  Matt R. Blondine Hottel MD [facility]  Pager: [phone]

## 2020-02-03 DIAGNOSIS — N390 Urinary tract infection, site not specified: Secondary | ICD-10-CM | POA: Diagnosis not present

## 2020-02-03 DIAGNOSIS — A419 Sepsis, unspecified organism: Secondary | ICD-10-CM | POA: Diagnosis not present

## 2020-02-03 LAB — CBC WITH DIFFERENTIAL/PLATELET
Abs Immature Granulocytes: 0.09 10*3/uL — ABNORMAL HIGH (ref 0.00–0.07)
Basophils Absolute: 0 10*3/uL (ref 0.0–0.1)
Basophils Relative: 0 %
Eosinophils Absolute: 0.2 10*3/uL (ref 0.0–0.5)
Eosinophils Relative: 1 %
HCT: 34.9 % — ABNORMAL LOW (ref 36.0–46.0)
Hemoglobin: 11.4 g/dL — ABNORMAL LOW (ref 12.0–15.0)
Immature Granulocytes: 1 %
Lymphocytes Relative: 11 %
Lymphs Abs: 1.5 10*3/uL (ref 0.7–4.0)
MCH: 32.3 pg (ref 26.0–34.0)
MCHC: 32.7 g/dL (ref 30.0–36.0)
MCV: 98.9 fL (ref 80.0–100.0)
Monocytes Absolute: 1.1 10*3/uL — ABNORMAL HIGH (ref 0.1–1.0)
Monocytes Relative: 8 %
Neutro Abs: 11 10*3/uL — ABNORMAL HIGH (ref 1.7–7.7)
Neutrophils Relative %: 79 %
Platelets: 218 10*3/uL (ref 150–400)
RBC: 3.53 MIL/uL — ABNORMAL LOW (ref 3.87–5.11)
RDW: 12.5 % (ref 11.5–15.5)
WBC: 14 10*3/uL — ABNORMAL HIGH (ref 4.0–10.5)
nRBC: 0 % (ref 0.0–0.2)

## 2020-02-03 LAB — BASIC METABOLIC PANEL
Anion gap: 7 (ref 5–15)
BUN: 13 mg/dL (ref 8–23)
CO2: 25 mmol/L (ref 22–32)
Calcium: 9.1 mg/dL (ref 8.9–10.3)
Chloride: 107 mmol/L (ref 98–111)
Creatinine, Ser: 0.66 mg/dL (ref 0.44–1.00)
GFR, Estimated: >60 mL/min (ref >60)
Glucose, Bld: 122 mg/dL — ABNORMAL HIGH (ref 70–99)
Potassium: 3.6 mmol/L (ref 3.5–5.1)
Sodium: 139 mmol/L (ref 135–145)

## 2020-02-03 LAB — URINE CULTURE: Culture: NO GROWTH

## 2020-02-03 LAB — MAGNESIUM: Magnesium: 2.1 mg/dL (ref 1.7–2.4)

## 2020-02-03 MED ORDER — SODIUM CHLORIDE 0.9 % IV SOLN
2.0000 g | INTRAVENOUS | Status: DC
  Administered 2020-02-03 – 2020-02-05 (×3): 2 g via INTRAVENOUS
  Filled 2020-02-03 (×4): qty 2

## 2020-02-03 MED ORDER — SODIUM CHLORIDE 0.9 % IV SOLN: INTRAVENOUS | Status: DC

## 2020-02-03 MED ORDER — SODIUM CHLORIDE 0.9 % IV SOLN
1.0000 g | INTRAVENOUS | Status: DC
  Administered 2020-02-03: 1 g via INTRAVENOUS
  Filled 2020-02-03: qty 1

## 2020-02-03 NOTE — Progress Notes (Signed)
1 Day Post-Op Subjective: Patient reports decreased pain. Urine is bloody today. No other complaints. Urine culture growing gram negative rods  Objective: Vital signs in last 24 hours: Temp:  [97.8 F (36.6 C)-98.2 F (36.8 C)] 97.8 F (36.6 C) (01/08 1349) Pulse Rate:  [65-84] 80 (01/08 1349) Resp:  [13-17] 13 (01/08 1349) BP: (106-141)/(46-65) 106/46 (01/08 1349) SpO2:  [95 %-99 %] 98 % (01/08 1349)  Intake/Output from previous day: 01/07 0701 - 01/08 0700 In: 1371.6 [P.O.:360; I.V.:853.1; IV Piggyback:158.4] Out: 600 [Urine:600] Intake/Output this shift: Total I/O In: -  Out: 750 [Urine:750]  Physical Exam:  General:alert, cooperative and appears stated age GI: soft, non tender, normal bowel sounds, no palpable masses, no organomegaly, no inguinal hernia Female genitalia: not done Extremities: extremities normal, atraumatic, no cyanosis or edema  Lab Results: Recent Labs    02/02/20 0751 02/02/20 1736 02/03/20 0601  HGB 12.6 12.1 11.4*  HCT 38.5 37.3 34.9*   BMET Recent Labs    02/02/20 0751 02/03/20 0601  NA 140 139  K 3.8 3.6  CL 108 107  CO2 24 25  GLUCOSE 162* 122*  BUN 12 13  CREATININE 0.65 0.66  CALCIUM 9.0 9.1   Recent Labs    02/01/20 2235 02/02/20 0751  INR 1.3* 1.3*   No results for input(s): LABURIN in the last 72 hours. Results for orders placed or performed during the hospital encounter of 02/01/20  Blood Culture (routine x 2)     Status: None (Preliminary result)   Collection Time: 02/01/20 10:35 PM   Specimen: BLOOD RIGHT FOREARM  Result Value Ref Range Status   Specimen Description   Final    BLOOD RIGHT FOREARM Performed at [facility], [address] [place], [place] [address]    Special Requests   Final    BOTTLES DRAWN AEROBIC AND ANAEROBIC Blood Culture results may not be optimal due to an inadequate volume of blood received in culture bottles Performed at [facility], [address] [place], [place] [address]    Culture   Final    NO GROWTH 1 DAY Performed at [facility], [address] [place], [place] [address]    Report Status PENDING  Incomplete  Resp Panel by RT-PCR (Flu A&B, Covid) In/Out Cath Urine     Status: None   Collection Time: 02/01/20 11:05 PM   Specimen: In/Out Cath Urine; Nasopharyngeal(NP) swabs in vial transport medium  Result Value Ref Range Status   SARS Coronavirus 2 by RT PCR NEGATIVE NEGATIVE Final    Comment: (NOTE) SARS-CoV-2 target nucleic acids are NOT DETECTED.  The SARS-CoV-2 RNA is generally detectable in upper respiratory specimens during the acute phase of infection. The lowest concentration of SARS-CoV-2 viral copies this assay can detect is 138 copies/mL. A negative result does not preclude SARS-Cov-2 infection and should not be used as the sole basis for treatment or other patient management decisions. A negative result may occur with  improper specimen collection/handling, submission of specimen other than nasopharyngeal swab, presence of viral mutation(s) within the areas targeted by this assay, and inadequate number of viral copies(<138 copies/mL). A negative result must be combined with clinical observations, patient history, and epidemiological information. The expected result is Negative.  Fact Sheet for Patients:  BloggerCourse.comhttps://www.fda.gov/media/152166/download  Fact Sheet for Healthcare Providers:  SeriousBroker.ithttps://www.fda.gov/media/152162/download  This test is no t yet approved or cleared by the [place] FDA and  has been authorized for detection and/or diagnosis  of SARS-CoV-2 by FDA under an Emergency Use Authorization (EUA). This EUA will remain  in effect (meaning this test can be used) for the duration of the COVID-19 declaration under Section 564(b)(1) of the Act, 21 U.S.C.section 360bbb-3(b)(1), unless the authorization is terminated  or revoked sooner.       Influenza A by PCR NEGATIVE  NEGATIVE Final   Influenza B by PCR NEGATIVE NEGATIVE Final    Comment: (NOTE) The Xpert Xpress SARS-CoV-2/FLU/RSV plus assay is intended as an aid in the diagnosis of influenza from Nasopharyngeal swab specimens and should not be used as a sole basis for treatment. Nasal washings and aspirates are unacceptable for Xpert Xpress SARS-CoV-2/FLU/RSV testing.  Fact Sheet for Patients: BloggerCourse.com  Fact Sheet for Healthcare Providers: SeriousBroker.it  This test is not yet approved or cleared by the [place] FDA and has been authorized for detection and/or diagnosis of SARS-CoV-2 by FDA under an Emergency Use Authorization (EUA). This EUA will remain in effect (meaning this test can be used) for the duration of the COVID-19 declaration under Section 564(b)(1) of the Act, 21 U.S.C. section 360bbb-3(b)(1), unless the authorization is terminated or revoked.  Performed at [facility], [address], [place], [place] [address]   Urine culture     Status: Abnormal (Preliminary result)   Collection Time: 02/01/20 11:26 PM   Specimen: In/Out Cath Urine  Result Value Ref Range Status   Specimen Description   Final    IN/OUT CATH URINE Performed at [facility], [address], [place], [place] [address]    Special Requests   Final    NONE Performed at [facility], [address] [place], [place] [address]    Culture (A)  Final    >=100,000 COLONIES/mL PSEUDOMONAS AERUGINOSA SUSCEPTIBILITIES TO FOLLOW Performed at [facility], [address], [place], [place] [address]    Report Status PENDING  Incomplete  Blood Culture (routine x 2)     Status: None (Preliminary result)   Collection Time: 02/02/20  1:17 AM   Specimen: BLOOD  Result Value Ref Range Status   Specimen Description   Final    BLOOD RIGHT ANTECUBITAL Performed at [facility],  [address], [place], [place] [address]    Special Requests   Final    BOTTLES DRAWN AEROBIC AND ANAEROBIC Blood Culture adequate volume Performed at [facility], [address] [place], [place] [address]    Culture   Final    NO GROWTH 1 DAY Performed at [facility], [address], [place], [place] [address]    Report Status PENDING  Incomplete  Urine Culture     Status: None   Collection Time: 02/02/20  4:26 AM   Specimen: Urine, Cystoscope  Result Value Ref Range Status   Specimen Description   Final    CYSTOSCOPY LT RENAL PELCIS Performed at [facility], [address] [place], [place] [address]    Special Requests   Final    PATIENT ON FOLLOWING Clement Husbands, MAXIPIME Performed at [facility], [address] [place], [place] [address]    Culture   Final    NO GROWTH Performed at [facility], [address] [place], [place] [address]    Report Status 02/03/2020 FINAL  Final    Studies/Results: CT Angio Chest PE W and/or Wo Contrast  Result Date: 02/02/2020 CLINICAL DATA:  Fever, undergoing antibiotic  therapy for urinary tract infection, COPD, wound drainage EXAM: CT ANGIOGRAPHY CHEST CT ABDOMEN AND PELVIS WITH CONTRAST TECHNIQUE: Multidetector CT imaging of the chest was performed using the standard protocol during bolus administration of intravenous contrast. Multiplanar CT image reconstructions and MIPs were obtained to evaluate the vascular anatomy. Multidetector CT imaging of the abdomen and pelvis was performed using the standard protocol during bolus administration of intravenous contrast. CONTRAST:  43mL OMNIPAQUE IOHEXOL 350 MG/ML SOLN COMPARISON:  CT abdomen pelvis 01/27/2020 FINDINGS: CTA CHEST FINDINGS Cardiovascular: There is adequate opacification of the pulmonary arterial tree. No intraluminal filling defect to suggest acute pulmonary embolism. The central pulmonary arteries are of normal  caliber. Mild coronary artery calcification. Global cardiac size within normal limits. No pericardial effusion. The thoracic aorta demonstrates mild atherosclerotic calcification. No aneurysm. Mediastinum/Nodes: Moderate hiatal hernia. Esophagus unremarkable. No pathologic thoracic adenopathy. Lungs/Pleura: Mild left basilar atelectasis. Lungs are otherwise clear. No pneumothorax or pleural effusion. There is central airway impaction involving the left lower lobe likely related to debris. No central obstructing mass lesion identified. Musculoskeletal: No acute bone abnormality. Review of the MIP images confirms the above findings. CT ABDOMEN and PELVIS FINDINGS Hepatobiliary: No focal liver abnormality is seen. No gallstones, gallbladder wall thickening, or biliary dilatation. Pancreas: Unremarkable Spleen: Unremarkable Adrenals/Urinary Tract: The adrenal glands are unremarkable. The kidneys are normal in size and position. Mild to moderate left hydronephrosis and mild perinephric stranding is again identified and appears unchanged with a an obstructing 7 mm calculus again identified within the left ureteropelvic junction. No additional nephro or urolithiasis. The right kidney is unremarkable. Layering hyperdense material within the bladder lumen demonstrates an altered configuration when compared to prior examination and most likely represents layering calcific debris within the bladder lumen. The bladder is otherwise unremarkable. Stomach/Bowel: Mild sigmoid diverticulosis. Moderate stool throughout the colon. Broad-based small umbilical hernia contains a single wall of the mid transverse colon. No superimposed inflammatory change. The stomach, small bowel, and large bowel are otherwise unremarkable. Appendix normal. No free intraperitoneal gas or fluid. Vascular/Lymphatic: Mild aortoiliac atherosclerotic calcification. No aortic aneurysm. No pathologic adenopathy within the abdomen and pelvis. Reproductive:  Status post hysterectomy. No adnexal masses. Other: Small stool within the rectal vault. The rectum is otherwise unremarkable. Small supraumbilical ventral hernia is again seen containing mesenteric fat Musculoskeletal: No acute bone abnormality. Degenerative changes noted within the lumbar spine. Review of the MIP images confirms the above findings. IMPRESSION: No pulmonary embolism.  No acute intrathoracic pathology identified. Persistent moderate left hydronephrosis secondary to an obstructing 7 mm calculus at the left ureteropelvic junction. This appears unchanged from prior examination. High density material within the bladder represents layering calcific debris. Small supraumbilical ventral hernia containing mesenteric fat and umbilical hernia containing a single wall of unremarkable transverse colon. These are unchanged. Aortic Atherosclerosis (ICD10-I70.0). Electronically Signed   By: Helyn Numbers MD   On: 02/02/2020 00:43   CT ABDOMEN PELVIS W CONTRAST  Result Date: 02/02/2020 CLINICAL DATA:  Fever, undergoing antibiotic therapy for urinary tract infection, COPD, wound drainage EXAM: CT ANGIOGRAPHY CHEST CT ABDOMEN AND PELVIS WITH CONTRAST TECHNIQUE: Multidetector CT imaging of the chest was performed using the standard protocol during bolus administration of intravenous contrast. Multiplanar CT image reconstructions and MIPs were obtained to evaluate the vascular anatomy. Multidetector CT imaging of the abdomen and pelvis was performed using the standard protocol during bolus administration of intravenous contrast. CONTRAST:  71mL OMNIPAQUE IOHEXOL 350 MG/ML SOLN COMPARISON:  CT abdomen pelvis 01/27/2020 FINDINGS: CTA  CHEST FINDINGS Cardiovascular: There is adequate opacification of the pulmonary arterial tree. No intraluminal filling defect to suggest acute pulmonary embolism. The central pulmonary arteries are of normal caliber. Mild coronary artery calcification. Global cardiac size within normal  limits. No pericardial effusion. The thoracic aorta demonstrates mild atherosclerotic calcification. No aneurysm. Mediastinum/Nodes: Moderate hiatal hernia. Esophagus unremarkable. No pathologic thoracic adenopathy. Lungs/Pleura: Mild left basilar atelectasis. Lungs are otherwise clear. No pneumothorax or pleural effusion. There is central airway impaction involving the left lower lobe likely related to debris. No central obstructing mass lesion identified. Musculoskeletal: No acute bone abnormality. Review of the MIP images confirms the above findings. CT ABDOMEN and PELVIS FINDINGS Hepatobiliary: No focal liver abnormality is seen. No gallstones, gallbladder wall thickening, or biliary dilatation. Pancreas: Unremarkable Spleen: Unremarkable Adrenals/Urinary Tract: The adrenal glands are unremarkable. The kidneys are normal in size and position. Mild to moderate left hydronephrosis and mild perinephric stranding is again identified and appears unchanged with a an obstructing 7 mm calculus again identified within the left ureteropelvic junction. No additional nephro or urolithiasis. The right kidney is unremarkable. Layering hyperdense material within the bladder lumen demonstrates an altered configuration when compared to prior examination and most likely represents layering calcific debris within the bladder lumen. The bladder is otherwise unremarkable. Stomach/Bowel: Mild sigmoid diverticulosis. Moderate stool throughout the colon. Broad-based small umbilical hernia contains a single wall of the mid transverse colon. No superimposed inflammatory change. The stomach, small bowel, and large bowel are otherwise unremarkable. Appendix normal. No free intraperitoneal gas or fluid. Vascular/Lymphatic: Mild aortoiliac atherosclerotic calcification. No aortic aneurysm. No pathologic adenopathy within the abdomen and pelvis. Reproductive: Status post hysterectomy. No adnexal masses. Other: Small stool within the rectal  vault. The rectum is otherwise unremarkable. Small supraumbilical ventral hernia is again seen containing mesenteric fat Musculoskeletal: No acute bone abnormality. Degenerative changes noted within the lumbar spine. Review of the MIP images confirms the above findings. IMPRESSION: No pulmonary embolism.  No acute intrathoracic pathology identified. Persistent moderate left hydronephrosis secondary to an obstructing 7 mm calculus at the left ureteropelvic junction. This appears unchanged from prior examination. High density material within the bladder represents layering calcific debris. Small supraumbilical ventral hernia containing mesenteric fat and umbilical hernia containing a single wall of unremarkable transverse colon. These are unchanged. Aortic Atherosclerosis (ICD10-I70.0). Electronically Signed   By: Helyn Numbers MD   On: 02/02/2020 00:43   DG Chest Port 1 View  Result Date: 02/01/2020 CLINICAL DATA:  Sepsis EXAM: PORTABLE CHEST 1 VIEW COMPARISON:  01/27/2020 FINDINGS: The heart size and mediastinal contours are within normal limits. Both lungs are clear. The visualized skeletal structures are unremarkable. IMPRESSION: No active disease. Electronically Signed   By: Helyn Numbers MD   On: 02/01/2020 23:04   DG C-Arm 1-60 Min-No Report  Result Date: 02/02/2020 Fluoroscopy was utilized by the requesting physician.  No radiographic interpretation.    Assessment/Plan: POD#1 L stent placement   1. Please continue broad spectrum antibiotics pending urine culture   LOS: 1 day   Wilkie Aye 02/03/2020, 3:42 PM

## 2020-02-03 NOTE — Plan of Care (Signed)

## 2020-02-03 NOTE — Progress Notes (Signed)
PROGRESS NOTE    Waverly Tarquinio   ZOX:[id number]  DOB: [birth year]  DOA: 02/01/2020     1  PCP: Soundra Pilon, FNP  CC: fever  Hospital Course: Ms. Kafer is an 87 yo female with PMH severe dementia (mostly nonverbal), COPD, HTN, HLD, hypothyroidism, osteoporosis, history of blood clots (on Eliquis).  She was brought in from her SNF for new development of fever.  She had recently been treated with Zyvox p.o. after being seen in the ER on 01/28/2020 from a fall out of her wheelchair and urine culture then grew E faecalis. She had also underwent a CT head during that time which showed soft tissue swelling on her left forehead but no other obvious abnormality. During same work-up she was also found to have a 5 mm left ureteropelvic stone and was going to have outpatient intervention however due to her fever on presentation this hospitalization she underwent repeat CT abdomen/pelvis and this showed an obstructing 7 mm calculus at the left ureteropelvic junction.  She was taken to the OR and underwent stent placement in the left ureter.   Interval History:  No events overnight.  Still very withdrawn and essentially nonverbal.  Does try to utter some words when spoken to but mostly does not make sense.  Was in no distress this morning and could not answer any questions regarding improvement or worsening of her urinary symptoms.  Old records reviewed in assessment of this patient  ROS: Review of systems not obtained due to patient factors.  Severe dementia  Assessment & Plan: * Sepsis (HCC) -Tachycardic, tachypneic, leukocytosis; source considered urinary -See complicated UTI  Ureteral stone with hydronephrosis -See complicated UTI  Complicated UTI (urinary tract infection) -CT abdomen/pelvis this hospitalization shows left ureteropelvic stone measuring 7 mm with obstruction -Underwent left ureter stent placement with urology on admission -Initially started on vancomycin -Had been on  oral Zyvox prior to admission.  Restarted back on Zyvox on 02/02/2020 to target E faecalis which was growing on urine culture on 01/27/2020.  New urine culture this hospitalization now growing Pseudomonas.  Will add on cefepime at this time  Dementia [facility]) -Severe -Patient is nonverbal at baseline and only follows a few commands -SLP eval completed on 1/7: dys 3 diet and meds with ice cream or applesauce and monitored eating  COPD (chronic obstructive pulmonary disease) (HCC) -Lungs clear.  No signs of exacerbation at this time   Antimicrobials: Cefepime 02/01/20 x 1 Vanc 02/01/20 x 1 Zyvox 02/01/20>>current  Cefepime 02/03/2020 >> current    DVT prophylaxis: Eliquis Code Status: [name] Family Communication: none present Disposition Plan: Status is: Inpatient  Remains inpatient appropriate because:Ongoing diagnostic testing needed not appropriate for outpatient work up, IV treatments appropriate due to intensity of illness or inability to take PO and Inpatient level of care appropriate due to severity of illness   Dispo: The patient is from: SNF              Anticipated d/c is to: SNF              Anticipated d/c date is: 2 days              Patient currently is not medically stable to d/c.   Objective: Blood pressure (!) 106/46, pulse 80, temperature 97.8 F (36.6 C), resp. rate 13, height 5\' 4"  (1.626 m), SpO2 98 %.  Examination: General appearance: Pleasant elderly woman resting in bed in no distress and remains nonverbal and following only a  few commands Head: Left forehead swelling with bruising appreciated Eyes: Pupils equally round, EOMI Lungs: clear to auscultation bilaterally Heart: regular rate and rhythm and S1, S2 normal Abdomen: normal findings: bowel sounds normal and soft, non-tender Extremities: No edema Skin: mobility and turgor normal Neurologic: Moves all 4 extremities.  Follows some commands.  Consultants:   Urology  Procedures:   Left ureteral stent  placement, 02/02/2020  Data Reviewed: I have personally reviewed following labs and imaging studies Results for orders placed or performed during the hospital encounter of 02/01/20 (from the past 24 hour(s))  Hemoglobin and hematocrit, blood     Status: None   Collection Time: 02/02/20  5:36 PM  Result Value Ref Range   Hemoglobin 12.1 12.0 - 15.0 g/dL   HCT 40.9 81.1 - 91.4 %  Basic metabolic panel     Status: Abnormal   Collection Time: 02/03/20  6:01 AM  Result Value Ref Range   Sodium 139 135 - 145 mmol/L   Potassium 3.6 3.5 - 5.1 mmol/L   Chloride 107 98 - 111 mmol/L   CO2 25 22 - 32 mmol/L   Glucose, Bld 122 (H) 70 - 99 mg/dL   BUN 13 8 - 23 mg/dL   Creatinine, Ser 7.82 0.44 - 1.00 mg/dL   Calcium 9.1 8.9 - 95.6 mg/dL   GFR, Estimated >21 >30 mL/min   Anion gap 7 5 - 15  CBC with Differential/Platelet     Status: Abnormal   Collection Time: 02/03/20  6:01 AM  Result Value Ref Range   WBC 14.0 (H) 4.0 - 10.5 K/uL   RBC 3.53 (L) 3.87 - 5.11 MIL/uL   Hemoglobin 11.4 (L) 12.0 - 15.0 g/dL   HCT 86.5 (L) 78.4 - 69.6 %   MCV 98.9 80.0 - 100.0 fL   MCH 32.3 26.0 - 34.0 pg   MCHC 32.7 30.0 - 36.0 g/dL   RDW 29.5 28.4 - 13.2 %   Platelets 218 150 - 400 K/uL   nRBC 0.0 0.0 - 0.2 %   Neutrophils Relative % 79 %   Neutro Abs 11.0 (H) 1.7 - 7.7 K/uL   Lymphocytes Relative 11 %   Lymphs Abs 1.5 0.7 - 4.0 K/uL   Monocytes Relative 8 %   Monocytes Absolute 1.1 (H) 0.1 - 1.0 K/uL   Eosinophils Relative 1 %   Eosinophils Absolute 0.2 0.0 - 0.5 K/uL   Basophils Relative 0 %   Basophils Absolute 0.0 0.0 - 0.1 K/uL   Immature Granulocytes 1 %   Abs Immature Granulocytes 0.09 (H) 0.00 - 0.07 K/uL  Magnesium     Status: None   Collection Time: 02/03/20  6:01 AM  Result Value Ref Range   Magnesium 2.1 1.7 - 2.4 mg/dL    Recent Results (from the past 240 hour(s))  Resp Panel by RT-PCR (Flu A&B, Covid) Nasopharyngeal Swab     Status: None   Collection Time: 01/27/20  1:08 PM    Specimen: Nasopharyngeal Swab; Nasopharyngeal(NP) swabs in vial transport medium  Result Value Ref Range Status   SARS Coronavirus 2 by RT PCR NEGATIVE NEGATIVE Final    Comment: (NOTE) SARS-CoV-2 target nucleic acids are NOT DETECTED.  The SARS-CoV-2 RNA is generally detectable in upper respiratory specimens during the acute phase of infection. The lowest concentration of SARS-CoV-2 viral copies this assay can detect is 138 copies/mL. A negative result does not preclude SARS-Cov-2 infection and should not be used as the sole basis for treatment or  other patient management decisions. A negative result may occur with  improper specimen collection/handling, submission of specimen other than nasopharyngeal swab, presence of viral mutation(s) within the areas targeted by this assay, and inadequate number of viral copies(<138 copies/mL). A negative result must be combined with clinical observations, patient history, and epidemiological information. The expected result is Negative.  Fact Sheet for Patients:  BloggerCourse.com  Fact Sheet for Healthcare Providers:  SeriousBroker.it  This test is no t yet approved or cleared by the [place] FDA and  has been authorized for detection and/or diagnosis of SARS-CoV-2 by FDA under an Emergency Use Authorization (EUA). This EUA will remain  in effect (meaning this test can be used) for the duration of the COVID-19 declaration under Section 564(b)(1) of the Act, 21 U.S.C.section 360bbb-3(b)(1), unless the authorization is terminated  or revoked sooner.       Influenza A by PCR NEGATIVE NEGATIVE Final   Influenza B by PCR NEGATIVE NEGATIVE Final    Comment: (NOTE) The Xpert Xpress SARS-CoV-2/FLU/RSV plus assay is intended as an aid in the diagnosis of influenza from Nasopharyngeal swab specimens and should not be used as a sole basis for treatment. Nasal washings and aspirates are  unacceptable for Xpert Xpress SARS-CoV-2/FLU/RSV testing.  Fact Sheet for Patients: BloggerCourse.com  Fact Sheet for Healthcare Providers: SeriousBroker.it  This test is not yet approved or cleared by the [place] FDA and has been authorized for detection and/or diagnosis of SARS-CoV-2 by FDA under an Emergency Use Authorization (EUA). This EUA will remain in effect (meaning this test can be used) for the duration of the COVID-19 declaration under Section 564(b)(1) of the Act, 21 U.S.C. section 360bbb-3(b)(1), unless the authorization is terminated or revoked.  Performed at [facility], [address], [place], [place] [address]   Urine Culture     Status: Abnormal   Collection Time: 01/27/20  1:08 PM   Specimen: Urine, Catheterized  Result Value Ref Range Status   Specimen Description   Final    URINE, CATHETERIZED Performed at [facility], [address] [place], [place] [address]    Special Requests   Final    NONE Performed at [facility], [address], [place], [place] [address]    Culture >=100,000 COLONIES/mL ENTEROCOCCUS FAECALIS (A)  Final   Report Status 01/29/2020 FINAL  Final   Organism ID, Bacteria ENTEROCOCCUS FAECALIS (A)  Final      Susceptibility   Enterococcus faecalis - MIC*    AMPICILLIN <=2 SENSITIVE Sensitive     NITROFURANTOIN <=16 SENSITIVE Sensitive     VANCOMYCIN 1 SENSITIVE Sensitive     * >=100,000 COLONIES/mL ENTEROCOCCUS FAECALIS  Blood Culture (routine x 2)     Status: None (Preliminary result)   Collection Time: 02/01/20 10:35 PM   Specimen: BLOOD RIGHT FOREARM  Result Value Ref Range Status   Specimen Description   Final    BLOOD RIGHT FOREARM Performed at [facility], [address], [place], [place] [address]    Special Requests   Final    BOTTLES DRAWN AEROBIC AND ANAEROBIC Blood Culture results  may not be optimal due to an inadequate volume of blood received in culture bottles Performed at [facility], [address], [place], [place] [address]    Culture   Final    NO GROWTH 1 DAY Performed at [facility], [address] [place], [place] [address]    Report  Status PENDING  Incomplete  Resp Panel by RT-PCR (Flu A&B, Covid) In/Out Cath Urine     Status: None   Collection Time: 02/01/20 11:05 PM   Specimen: In/Out Cath Urine; Nasopharyngeal(NP) swabs in vial transport medium  Result Value Ref Range Status   SARS Coronavirus 2 by RT PCR NEGATIVE NEGATIVE Final    Comment: (NOTE) SARS-CoV-2 target nucleic acids are NOT DETECTED.  The SARS-CoV-2 RNA is generally detectable in upper respiratory specimens during the acute phase of infection. The lowest concentration of SARS-CoV-2 viral copies this assay can detect is 138 copies/mL. A negative result does not preclude SARS-Cov-2 infection and should not be used as the sole basis for treatment or other patient management decisions. A negative result may occur with  improper specimen collection/handling, submission of specimen other than nasopharyngeal swab, presence of viral mutation(s) within the areas targeted by this assay, and inadequate number of viral copies(<138 copies/mL). A negative result must be combined with clinical observations, patient history, and epidemiological information. The expected result is Negative.  Fact Sheet for Patients:  BloggerCourse.com  Fact Sheet for Healthcare Providers:  SeriousBroker.it  This test is no t yet approved or cleared by the [place] FDA and  has been authorized for detection and/or diagnosis of SARS-CoV-2 by FDA under an Emergency Use Authorization (EUA). This EUA will remain  in effect (meaning this test can be used) for the duration of the COVID-19 declaration under Section 564(b)(1) of the Act,  21 U.S.C.section 360bbb-3(b)(1), unless the authorization is terminated  or revoked sooner.       Influenza A by PCR NEGATIVE NEGATIVE Final   Influenza B by PCR NEGATIVE NEGATIVE Final    Comment: (NOTE) The Xpert Xpress SARS-CoV-2/FLU/RSV plus assay is intended as an aid in the diagnosis of influenza from Nasopharyngeal swab specimens and should not be used as a sole basis for treatment. Nasal washings and aspirates are unacceptable for Xpert Xpress SARS-CoV-2/FLU/RSV testing.  Fact Sheet for Patients: BloggerCourse.com  Fact Sheet for Healthcare Providers: SeriousBroker.it  This test is not yet approved or cleared by the [place] FDA and has been authorized for detection and/or diagnosis of SARS-CoV-2 by FDA under an Emergency Use Authorization (EUA). This EUA will remain in effect (meaning this test can be used) for the duration of the COVID-19 declaration under Section 564(b)(1) of the Act, 21 U.S.C. section 360bbb-3(b)(1), unless the authorization is terminated or revoked.  Performed at [facility], [address], [place], [place] [address]   Urine culture     Status: Abnormal (Preliminary result)   Collection Time: 02/01/20 11:26 PM   Specimen: In/Out Cath Urine  Result Value Ref Range Status   Specimen Description   Final    IN/OUT CATH URINE Performed at [facility], [address], [place], [place] [address]    Special Requests   Final    NONE Performed at [facility], [address], [place], [place] [address]    Culture (A)  Final    >=100,000 COLONIES/mL PSEUDOMONAS AERUGINOSA SUSCEPTIBILITIES TO FOLLOW Performed at [facility], [address], [place], [place] [address]    Report Status PENDING  Incomplete  Blood Culture (routine x 2)     Status: None (Preliminary result)   Collection Time: 02/02/20  1:17 AM   Specimen: BLOOD   Result Value Ref Range Status   Specimen Description   Final    BLOOD RIGHT ANTECUBITAL Performed at [facility], [address], [place], [place] [address]    Special Requests   Final    BOTTLES DRAWN AEROBIC AND ANAEROBIC Blood Culture adequate volume Performed at [facility], [address] [place], [place] [address]    Culture   Final    NO GROWTH 1 DAY Performed at [facility], [address] [place], [place] [address]    Report Status PENDING  Incomplete  Urine Culture     Status: None   Collection Time: 02/02/20  4:26 AM   Specimen: Urine, Cystoscope  Result Value Ref Range Status   Specimen Description   Final    CYSTOSCOPY LT RENAL PELCIS Performed at [facility], [address], [place], [place] [address]    Special Requests   Final    PATIENT ON FOLLOWING Clement Husbands, MAXIPIME Performed at [facility], [address] [place], [place] [address]    Culture   Final    NO GROWTH Performed at [facility], [address], [place], [place] [address]    Report Status 02/03/2020 FINAL  Final     Radiology Studies: CT Angio Chest PE W and/or Wo Contrast  Result Date: 02/02/2020 CLINICAL DATA:  Fever, undergoing antibiotic therapy for urinary tract infection, COPD, wound drainage EXAM: CT ANGIOGRAPHY CHEST CT ABDOMEN AND PELVIS WITH CONTRAST TECHNIQUE: Multidetector CT imaging of the chest was performed using the standard protocol during bolus administration of intravenous contrast. Multiplanar CT image reconstructions and MIPs were obtained to evaluate the vascular anatomy. Multidetector CT imaging of the abdomen and pelvis was performed using the standard protocol during bolus administration of intravenous contrast. CONTRAST:  80mL OMNIPAQUE IOHEXOL 350 MG/ML SOLN COMPARISON:  CT abdomen pelvis 01/27/2020 FINDINGS: CTA CHEST FINDINGS Cardiovascular: There is adequate opacification of the  pulmonary arterial tree. No intraluminal filling defect to suggest acute pulmonary embolism. The central pulmonary arteries are of normal caliber. Mild coronary artery calcification. Global cardiac size within normal limits. No pericardial effusion. The thoracic aorta demonstrates mild atherosclerotic calcification. No aneurysm. Mediastinum/Nodes: Moderate hiatal hernia. Esophagus unremarkable. No pathologic thoracic adenopathy. Lungs/Pleura: Mild left basilar atelectasis. Lungs are otherwise clear. No pneumothorax or pleural effusion. There is central airway impaction involving the left lower lobe likely related to debris. No central obstructing mass lesion identified. Musculoskeletal: No acute bone abnormality. Review of the MIP images confirms the above findings. CT ABDOMEN and PELVIS FINDINGS Hepatobiliary: No focal liver abnormality is seen. No gallstones, gallbladder wall thickening, or biliary dilatation. Pancreas: Unremarkable Spleen: Unremarkable Adrenals/Urinary Tract: The adrenal glands are unremarkable. The kidneys are normal in size and position. Mild to moderate left hydronephrosis and mild perinephric stranding is again identified and appears unchanged with a an obstructing 7 mm calculus again identified within the left ureteropelvic junction. No additional nephro or urolithiasis. The right kidney is unremarkable. Layering hyperdense material within the bladder lumen demonstrates an altered configuration when compared to prior examination and most likely represents layering calcific debris within the bladder lumen. The bladder is otherwise unremarkable. Stomach/Bowel: Mild sigmoid diverticulosis. Moderate stool throughout the colon. Broad-based small umbilical hernia contains a single wall of the mid transverse colon. No superimposed inflammatory change. The stomach, small bowel, and large bowel are otherwise unremarkable. Appendix normal. No free intraperitoneal gas or fluid. Vascular/Lymphatic: Mild  aortoiliac atherosclerotic calcification. No aortic aneurysm. No pathologic adenopathy within the abdomen and pelvis. Reproductive: Status post hysterectomy. No adnexal masses. Other: Small stool within the rectal vault.  The rectum is otherwise unremarkable. Small supraumbilical ventral hernia is again seen containing mesenteric fat Musculoskeletal: No acute bone abnormality. Degenerative changes noted within the lumbar spine. Review of the MIP images confirms the above findings. IMPRESSION: No pulmonary embolism.  No acute intrathoracic pathology identified. Persistent moderate left hydronephrosis secondary to an obstructing 7 mm calculus at the left ureteropelvic junction. This appears unchanged from prior examination. High density material within the bladder represents layering calcific debris. Small supraumbilical ventral hernia containing mesenteric fat and umbilical hernia containing a single wall of unremarkable transverse colon. These are unchanged. Aortic Atherosclerosis (ICD10-I70.0). Electronically Signed   By: Helyn NumbersAshesh  Parikh MD   On: 02/02/2020 00:43   CT ABDOMEN PELVIS W CONTRAST  Result Date: 02/02/2020 CLINICAL DATA:  Fever, undergoing antibiotic therapy for urinary tract infection, COPD, wound drainage EXAM: CT ANGIOGRAPHY CHEST CT ABDOMEN AND PELVIS WITH CONTRAST TECHNIQUE: Multidetector CT imaging of the chest was performed using the standard protocol during bolus administration of intravenous contrast. Multiplanar CT image reconstructions and MIPs were obtained to evaluate the vascular anatomy. Multidetector CT imaging of the abdomen and pelvis was performed using the standard protocol during bolus administration of intravenous contrast. CONTRAST:  80mL OMNIPAQUE IOHEXOL 350 MG/ML SOLN COMPARISON:  CT abdomen pelvis 01/27/2020 FINDINGS: CTA CHEST FINDINGS Cardiovascular: There is adequate opacification of the pulmonary arterial tree. No intraluminal filling defect to suggest acute pulmonary  embolism. The central pulmonary arteries are of normal caliber. Mild coronary artery calcification. Global cardiac size within normal limits. No pericardial effusion. The thoracic aorta demonstrates mild atherosclerotic calcification. No aneurysm. Mediastinum/Nodes: Moderate hiatal hernia. Esophagus unremarkable. No pathologic thoracic adenopathy. Lungs/Pleura: Mild left basilar atelectasis. Lungs are otherwise clear. No pneumothorax or pleural effusion. There is central airway impaction involving the left lower lobe likely related to debris. No central obstructing mass lesion identified. Musculoskeletal: No acute bone abnormality. Review of the MIP images confirms the above findings. CT ABDOMEN and PELVIS FINDINGS Hepatobiliary: No focal liver abnormality is seen. No gallstones, gallbladder wall thickening, or biliary dilatation. Pancreas: Unremarkable Spleen: Unremarkable Adrenals/Urinary Tract: The adrenal glands are unremarkable. The kidneys are normal in size and position. Mild to moderate left hydronephrosis and mild perinephric stranding is again identified and appears unchanged with a an obstructing 7 mm calculus again identified within the left ureteropelvic junction. No additional nephro or urolithiasis. The right kidney is unremarkable. Layering hyperdense material within the bladder lumen demonstrates an altered configuration when compared to prior examination and most likely represents layering calcific debris within the bladder lumen. The bladder is otherwise unremarkable. Stomach/Bowel: Mild sigmoid diverticulosis. Moderate stool throughout the colon. Broad-based small umbilical hernia contains a single wall of the mid transverse colon. No superimposed inflammatory change. The stomach, small bowel, and large bowel are otherwise unremarkable. Appendix normal. No free intraperitoneal gas or fluid. Vascular/Lymphatic: Mild aortoiliac atherosclerotic calcification. No aortic aneurysm. No pathologic  adenopathy within the abdomen and pelvis. Reproductive: Status post hysterectomy. No adnexal masses. Other: Small stool within the rectal vault. The rectum is otherwise unremarkable. Small supraumbilical ventral hernia is again seen containing mesenteric fat Musculoskeletal: No acute bone abnormality. Degenerative changes noted within the lumbar spine. Review of the MIP images confirms the above findings. IMPRESSION: No pulmonary embolism.  No acute intrathoracic pathology identified. Persistent moderate left hydronephrosis secondary to an obstructing 7 mm calculus at the left ureteropelvic junction. This appears unchanged from prior examination. High density material within the bladder represents layering calcific debris. Small supraumbilical ventral hernia containing mesenteric fat and  umbilical hernia containing a single wall of unremarkable transverse colon. These are unchanged. Aortic Atherosclerosis (ICD10-I70.0). Electronically Signed   By: Helyn NumbersAshesh  Parikh MD   On: 02/02/2020 00:43   DG Chest Port 1 View  Result Date: 02/01/2020 CLINICAL DATA:  Sepsis EXAM: PORTABLE CHEST 1 VIEW COMPARISON:  01/27/2020 FINDINGS: The heart size and mediastinal contours are within normal limits. Both lungs are clear. The visualized skeletal structures are unremarkable. IMPRESSION: No active disease. Electronically Signed   By: Helyn NumbersAshesh  Parikh MD   On: 02/01/2020 23:04   DG C-Arm 1-60 Min-No Report  Result Date: 02/02/2020 Fluoroscopy was utilized by the requesting physician.  No radiographic interpretation.   DG C-Arm 1-60 Min-No Report  Final Result    CT Angio Chest PE W and/or Wo Contrast  Final Result    CT ABDOMEN PELVIS W CONTRAST  Final Result    DG Chest Port 1 View  Final Result      Scheduled Meds: . apixaban  5 mg Oral BID  . levothyroxine  75 mcg Oral QAC breakfast  . mouth rinse  15 mL Mouth Rinse BID  . melatonin  3 mg Oral QHS  . mometasone-formoterol  2 puff Inhalation BID  .  simvastatin  10 mg Oral QODAY  . tamsulosin  0.4 mg Oral QHS   PRN Meds: acetaminophen **OR** acetaminophen, albuterol, morphine injection, ondansetron **OR** ondansetron (ZOFRAN) IV, senna-docusate Continuous Infusions: . sodium chloride 75 mL/hr at 02/03/20 1242  . ceFEPime (MAXIPIME) IV    . linezolid (ZYVOX) IV 600 mg (02/03/20 1005)     LOS: 1 day  Time spent: Greater than 50% of the 35 minute visit was spent in counseling/coordination of care for the patient as laid out in the A&P.   Lewie Chamberavid Solene Hereford, MD [facility] 02/03/2020, 3:30 PM

## 2020-02-04 DIAGNOSIS — A419 Sepsis, unspecified organism: Secondary | ICD-10-CM | POA: Diagnosis not present

## 2020-02-04 DIAGNOSIS — N390 Urinary tract infection, site not specified: Secondary | ICD-10-CM | POA: Diagnosis not present

## 2020-02-04 LAB — CBC WITH DIFFERENTIAL/PLATELET
Abs Immature Granulocytes: 0.06 10*3/uL (ref 0.00–0.07)
Basophils Absolute: 0 10*3/uL (ref 0.0–0.1)
Basophils Relative: 0 %
Eosinophils Absolute: 0.6 10*3/uL — ABNORMAL HIGH (ref 0.0–0.5)
Eosinophils Relative: 6 %
HCT: 35.4 % — ABNORMAL LOW (ref 36.0–46.0)
Hemoglobin: 11.5 g/dL — ABNORMAL LOW (ref 12.0–15.0)
Immature Granulocytes: 1 %
Lymphocytes Relative: 19 %
Lymphs Abs: 1.8 10*3/uL (ref 0.7–4.0)
MCH: 31.8 pg (ref 26.0–34.0)
MCHC: 32.5 g/dL (ref 30.0–36.0)
MCV: 97.8 fL (ref 80.0–100.0)
Monocytes Absolute: 0.9 10*3/uL (ref 0.1–1.0)
Monocytes Relative: 9 %
Neutro Abs: 6.1 10*3/uL (ref 1.7–7.7)
Neutrophils Relative %: 65 %
Platelets: 230 10*3/uL (ref 150–400)
RBC: 3.62 MIL/uL — ABNORMAL LOW (ref 3.87–5.11)
RDW: 12.6 % (ref 11.5–15.5)
WBC: 9.4 10*3/uL (ref 4.0–10.5)
nRBC: 0 % (ref 0.0–0.2)

## 2020-02-04 LAB — BASIC METABOLIC PANEL
Anion gap: 7 (ref 5–15)
BUN: 14 mg/dL (ref 8–23)
CO2: 25 mmol/L (ref 22–32)
Calcium: 8.6 mg/dL — ABNORMAL LOW (ref 8.9–10.3)
Chloride: 109 mmol/L (ref 98–111)
Creatinine, Ser: 0.67 mg/dL (ref 0.44–1.00)
GFR, Estimated: >60 mL/min (ref >60)
Glucose, Bld: 101 mg/dL — ABNORMAL HIGH (ref 70–99)
Potassium: 3.7 mmol/L (ref 3.5–5.1)
Sodium: 141 mmol/L (ref 135–145)

## 2020-02-04 LAB — URINE CULTURE: Culture: >=100000 — AB

## 2020-02-04 LAB — MAGNESIUM: Magnesium: 2 mg/dL (ref 1.7–2.4)

## 2020-02-04 NOTE — Progress Notes (Signed)
PROGRESS NOTE    Riannon Mukherjee   JAS:[id number]  DOB: 1930-12-18  DOA: 02/01/2020     2  PCP: Soundra Pilon, FNP  CC: fever  Hospital Course: Ms. Kilbourne is an 87 yo female with PMH severe dementia (mostly nonverbal), COPD, HTN, HLD, hypothyroidism, osteoporosis, history of blood clots (on Eliquis).  She was brought in from her SNF for new development of fever.  She had recently been treated with Zyvox p.o. after being seen in the ER on 01/28/2020 from a fall out of her wheelchair and urine culture then grew E faecalis. She had also underwent a CT head during that time which showed soft tissue swelling on her left forehead but no other obvious abnormality. During same work-up she was also found to have a 5 mm left ureteropelvic stone and was going to have outpatient intervention however due to her fever on presentation this hospitalization she underwent repeat CT abdomen/pelvis and this showed an obstructing 7 mm calculus at the left ureteropelvic junction.  She was taken to the OR and underwent stent placement in the left ureter.   Interval History:  No events overnight. Being fed b'fast this am and was eating well. Still very flat affect but starting to nod head easier today to questions.   Old records reviewed in assessment of this patient  ROS: Review of systems not obtained due to patient factors.  Severe dementia  Assessment & Plan: * Sepsis (HCC) -Tachycardic, tachypneic, leukocytosis; source considered urinary -See complicated UTI  Ureteral stone with hydronephrosis -See complicated UTI  Complicated UTI (urinary tract infection) -CT abdomen/pelvis this hospitalization shows left ureteropelvic stone measuring 7 mm with obstruction -Underwent left ureter stent placement with urology on admission -Initially started on vancomycin -Had been on oral Zyvox prior to admission.  Restarted back on Zyvox on 02/02/2020 to target E faecalis which was growing on urine culture on  01/27/2020.  New urine culture this hospitalization now growing Pseudomonas.  Will add on cefepime at this time  Dementia [facility]) -Severe -Patient is nonverbal at baseline and only follows a few commands -SLP eval completed on 1/7: dys 3 diet and meds with ice cream or applesauce and monitored eating  COPD (chronic obstructive pulmonary disease) (HCC) -Lungs clear.  No signs of exacerbation at this time   Antimicrobials: Cefepime 02/01/20 x 1 Vanc 02/01/20 x 1 Zyvox 02/01/20>>current  Cefepime 02/03/2020 >> current    DVT prophylaxis: Eliquis Code Status: [name] Family Communication: none present Disposition Plan: Status is: Inpatient  Remains inpatient appropriate because:Ongoing diagnostic testing needed not appropriate for outpatient work up, IV treatments appropriate due to intensity of illness or inability to take PO and Inpatient level of care appropriate due to severity of illness   Dispo: The patient is from: SNF              Anticipated d/c is to: SNF              Anticipated d/c date is: 2 days              Patient currently is not medically stable to d/c.   Objective: Blood pressure (!) 153/83, pulse 87, temperature 98 F (36.7 C), temperature source Oral, resp. rate 18, height 5\' 4"  (1.626 m), weight 58.9 kg, SpO2 97 %.  Examination: General appearance: Pleasant elderly woman resting in bed in no distress. Nodding head easier today but still pretty nonverbal overall  Head: Left forehead swelling with bruising appreciated Eyes: Pupils equally round, EOMI  Lungs: clear to auscultation bilaterally Heart: regular rate and rhythm and S1, S2 normal Abdomen: normal findings: bowel sounds normal and soft, non-tender Extremities: No edema Skin: mobility and turgor normal Neurologic: Moves all 4 extremities.  Follows some commands.  Consultants:   Urology  Procedures:   Left ureteral stent placement, 02/02/2020  Data Reviewed: I have personally reviewed following labs and  imaging studies Results for orders placed or performed during the hospital encounter of 02/01/20 (from the past 24 hour(s))  Basic metabolic panel     Status: Abnormal   Collection Time: 02/04/20  6:08 AM  Result Value Ref Range   Sodium 141 135 - 145 mmol/L   Potassium 3.7 3.5 - 5.1 mmol/L   Chloride 109 98 - 111 mmol/L   CO2 25 22 - 32 mmol/L   Glucose, Bld 101 (H) 70 - 99 mg/dL   BUN 14 8 - 23 mg/dL   Creatinine, Ser 1.610.67 0.44 - 1.00 mg/dL   Calcium 8.6 (L) 8.9 - 10.3 mg/dL   GFR, Estimated >09>60 >60>60 mL/min   Anion gap 7 5 - 15  CBC with Differential/Platelet     Status: Abnormal   Collection Time: 02/04/20  6:08 AM  Result Value Ref Range   WBC 9.4 4.0 - 10.5 K/uL   RBC 3.62 (L) 3.87 - 5.11 MIL/uL   Hemoglobin 11.5 (L) 12.0 - 15.0 g/dL   HCT 45.435.4 (L) 09.836.0 - 11.946.0 %   MCV 97.8 80.0 - 100.0 fL   MCH 31.8 26.0 - 34.0 pg   MCHC 32.5 30.0 - 36.0 g/dL   RDW 14.712.6 82.911.5 - 56.215.5 %   Platelets 230 150 - 400 K/uL   nRBC 0.0 0.0 - 0.2 %   Neutrophils Relative % 65 %   Neutro Abs 6.1 1.7 - 7.7 K/uL   Lymphocytes Relative 19 %   Lymphs Abs 1.8 0.7 - 4.0 K/uL   Monocytes Relative 9 %   Monocytes Absolute 0.9 0.1 - 1.0 K/uL   Eosinophils Relative 6 %   Eosinophils Absolute 0.6 (H) 0.0 - 0.5 K/uL   Basophils Relative 0 %   Basophils Absolute 0.0 0.0 - 0.1 K/uL   Immature Granulocytes 1 %   Abs Immature Granulocytes 0.06 0.00 - 0.07 K/uL  Magnesium     Status: None   Collection Time: 02/04/20  6:08 AM  Result Value Ref Range   Magnesium 2.0 1.7 - 2.4 mg/dL    Recent Results (from the past 240 hour(s))  Resp Panel by RT-PCR (Flu A&B, Covid) Nasopharyngeal Swab     Status: None   Collection Time: 01/27/20  1:08 PM   Specimen: Nasopharyngeal Swab; Nasopharyngeal(NP) swabs in vial transport medium  Result Value Ref Range Status   SARS Coronavirus 2 by RT PCR NEGATIVE NEGATIVE Final    Comment: (NOTE) SARS-CoV-2 target nucleic acids are NOT DETECTED.  The SARS-CoV-2 RNA is generally  detectable in upper respiratory specimens during the acute phase of infection. The lowest concentration of SARS-CoV-2 viral copies this assay can detect is 138 copies/mL. A negative result does not preclude SARS-Cov-2 infection and should not be used as the sole basis for treatment or other patient management decisions. A negative result may occur with  improper specimen collection/handling, submission of specimen other than nasopharyngeal swab, presence of viral mutation(s) within the areas targeted by this assay, and inadequate number of viral copies(<138 copies/mL). A negative result must be combined with clinical observations, patient history, and epidemiological information. The expected result  is Negative.  Fact Sheet for Patients:  BloggerCourse.comhttps://www.fda.gov/media/152166/download  Fact Sheet for Healthcare Providers:  SeriousBroker.ithttps://www.fda.gov/media/152162/download  This test is no t yet approved or cleared by the [place] FDA and  has been authorized for detection and/or diagnosis of SARS-CoV-2 by FDA under an Emergency Use Authorization (EUA). This EUA will remain  in effect (meaning this test can be used) for the duration of the COVID-19 declaration under Section 564(b)(1) of the Act, 21 U.S.C.section 360bbb-3(b)(1), unless the authorization is terminated  or revoked sooner.       Influenza A by PCR NEGATIVE NEGATIVE Final   Influenza B by PCR NEGATIVE NEGATIVE Final    Comment: (NOTE) The Xpert Xpress SARS-CoV-2/FLU/RSV plus assay is intended as an aid in the diagnosis of influenza from Nasopharyngeal swab specimens and should not be used as a sole basis for treatment. Nasal washings and aspirates are unacceptable for Xpert Xpress SARS-CoV-2/FLU/RSV testing.  Fact Sheet for Patients: BloggerCourse.comhttps://www.fda.gov/media/152166/download  Fact Sheet for Healthcare Providers: SeriousBroker.ithttps://www.fda.gov/media/152162/download  This test is not yet approved or cleared by the [place] FDA  and has been authorized for detection and/or diagnosis of SARS-CoV-2 by FDA under an Emergency Use Authorization (EUA). This EUA will remain in effect (meaning this test can be used) for the duration of the COVID-19 declaration under Section 564(b)(1) of the Act, 21 U.S.C. section 360bbb-3(b)(1), unless the authorization is terminated or revoked.  Performed at [facility], [address] [place], [place] [address]   Urine Culture     Status: Abnormal   Collection Time: 01/27/20  1:08 PM   Specimen: Urine, Catheterized  Result Value Ref Range Status   Specimen Description   Final    URINE, CATHETERIZED Performed at [facility], [address] [place], [place] [address]    Special Requests   Final    NONE Performed at [facility], [address] [place], [place] [address]    Culture >=100,000 COLONIES/mL ENTEROCOCCUS FAECALIS (A)  Final   Report Status 01/29/2020 FINAL  Final   Organism ID, Bacteria ENTEROCOCCUS FAECALIS (A)  Final      Susceptibility   Enterococcus faecalis - MIC*    AMPICILLIN <=2 SENSITIVE Sensitive     NITROFURANTOIN <=16 SENSITIVE Sensitive     VANCOMYCIN 1 SENSITIVE Sensitive     * >=100,000 COLONIES/mL ENTEROCOCCUS FAECALIS  Blood Culture (routine x 2)     Status: None (Preliminary result)   Collection Time: 02/01/20 10:35 PM   Specimen: BLOOD RIGHT FOREARM  Result Value Ref Range Status   Specimen Description   Final    BLOOD RIGHT FOREARM Performed at [facility], [address] [place], [place] [address]    Special Requests   Final    BOTTLES DRAWN AEROBIC AND ANAEROBIC Blood Culture results may not be optimal due to an inadequate volume of blood received in culture bottles Performed at [facility], [address] [place], [place] [address]    Culture   Final    NO GROWTH 2 DAYS Performed at [facility], [address]  [place], [place] [address]    Report Status PENDING  Incomplete  Resp Panel by RT-PCR (Flu A&B, Covid) In/Out Cath Urine     Status: None   Collection Time: 02/01/20 11:05 PM   Specimen: In/Out Cath Urine; Nasopharyngeal(NP) swabs in vial transport medium  Result Value Ref Range Status   SARS Coronavirus 2 by RT PCR NEGATIVE NEGATIVE  Final    Comment: (NOTE) SARS-CoV-2 target nucleic acids are NOT DETECTED.  The SARS-CoV-2 RNA is generally detectable in upper respiratory specimens during the acute phase of infection. The lowest concentration of SARS-CoV-2 viral copies this assay can detect is 138 copies/mL. A negative result does not preclude SARS-Cov-2 infection and should not be used as the sole basis for treatment or other patient management decisions. A negative result may occur with  improper specimen collection/handling, submission of specimen other than nasopharyngeal swab, presence of viral mutation(s) within the areas targeted by this assay, and inadequate number of viral copies(<138 copies/mL). A negative result must be combined with clinical observations, patient history, and epidemiological information. The expected result is Negative.  Fact Sheet for Patients:  BloggerCourse.com  Fact Sheet for Healthcare Providers:  SeriousBroker.it  This test is no t yet approved or cleared by the [place] FDA and  has been authorized for detection and/or diagnosis of SARS-CoV-2 by FDA under an Emergency Use Authorization (EUA). This EUA will remain  in effect (meaning this test can be used) for the duration of the COVID-19 declaration under Section 564(b)(1) of the Act, 21 U.S.C.section 360bbb-3(b)(1), unless the authorization is terminated  or revoked sooner.       Influenza A by PCR NEGATIVE NEGATIVE Final   Influenza B by PCR NEGATIVE NEGATIVE Final    Comment: (NOTE) The Xpert Xpress SARS-CoV-2/FLU/RSV plus assay is  intended as an aid in the diagnosis of influenza from Nasopharyngeal swab specimens and should not be used as a sole basis for treatment. Nasal washings and aspirates are unacceptable for Xpert Xpress SARS-CoV-2/FLU/RSV testing.  Fact Sheet for Patients: BloggerCourse.com  Fact Sheet for Healthcare Providers: SeriousBroker.it  This test is not yet approved or cleared by the [place] FDA and has been authorized for detection and/or diagnosis of SARS-CoV-2 by FDA under an Emergency Use Authorization (EUA). This EUA will remain in effect (meaning this test can be used) for the duration of the COVID-19 declaration under Section 564(b)(1) of the Act, 21 U.S.C. section 360bbb-3(b)(1), unless the authorization is terminated or revoked.  Performed at [facility], [address] [place], [place] [address]   Urine culture     Status: Abnormal   Collection Time: 02/01/20 11:26 PM   Specimen: In/Out Cath Urine  Result Value Ref Range Status   Specimen Description   Final    IN/OUT CATH URINE Performed at [facility], [address] [place], [place] [address]    Special Requests   Final    NONE Performed at [facility], [address], [place], [place] [address]    Culture >=100,000 COLONIES/mL PSEUDOMONAS AERUGINOSA (A)  Final   Report Status 02/04/2020 FINAL  Final   Organism ID, Bacteria PSEUDOMONAS AERUGINOSA (A)  Final      Susceptibility   Pseudomonas aeruginosa - MIC*    CEFTAZIDIME 2 SENSITIVE Sensitive     CIPROFLOXACIN 1 SENSITIVE Sensitive     GENTAMICIN 4 SENSITIVE Sensitive     IMIPENEM 2 SENSITIVE Sensitive     PIP/TAZO <=4 SENSITIVE Sensitive     * >=100,000 COLONIES/mL PSEUDOMONAS AERUGINOSA  Blood Culture (routine x 2)     Status: None (Preliminary result)   Collection Time: 02/02/20  1:17 AM   Specimen: BLOOD  Result Value Ref Range Status    Specimen Description   Final    BLOOD RIGHT ANTECUBITAL Performed at [facility], [address] [place],  [place] [address]    Special Requests   Final    BOTTLES DRAWN AEROBIC AND ANAEROBIC Blood Culture adequate volume Performed at [facility], [address] [place], [place] [address]    Culture   Final    NO GROWTH 2 DAYS Performed at [facility], [address] [place], [place] [address]    Report Status PENDING  Incomplete  Urine Culture     Status: None   Collection Time: 02/02/20  4:26 AM   Specimen: Urine, Cystoscope  Result Value Ref Range Status   Specimen Description   Final    CYSTOSCOPY LT RENAL PELCIS Performed at [facility], [address] [place], [place] [address]    Special Requests   Final    PATIENT ON FOLLOWING Clement Husbands, MAXIPIME Performed at [facility], [address] [place], [place] [address]    Culture   Final    NO GROWTH Performed at [facility], [address], [place], [place] [address]    Report Status 02/03/2020 FINAL  Final     Radiology Studies: No results found. DG C-Arm 1-60 Min-No Report  Final Result    CT Angio Chest PE W and/or Wo Contrast  Final Result    CT ABDOMEN PELVIS W CONTRAST  Final Result    DG Chest Port 1 View  Final Result      Scheduled Meds: . apixaban  5 mg Oral BID  . levothyroxine  75 mcg Oral QAC breakfast  . mouth rinse  15 mL Mouth Rinse BID  . melatonin  3 mg Oral QHS  . mometasone-formoterol  2 puff Inhalation BID  . simvastatin  10 mg Oral QODAY  . tamsulosin  0.4 mg Oral QHS   PRN Meds: acetaminophen **OR** acetaminophen, albuterol, morphine injection, ondansetron **OR** ondansetron (ZOFRAN) IV, senna-docusate Continuous Infusions: . sodium chloride 75 mL/hr at 02/04/20 0427  . ceFEPime (MAXIPIME) IV 2 g (02/03/20 1814)  . linezolid (ZYVOX) IV 600 mg (02/04/20 1006)     LOS: 2 days  Time  spent: Greater than 50% of the 35 minute visit was spent in counseling/coordination of care for the patient as laid out in the A&P.   Lewie Chamber, MD [facility] 02/04/2020, 2:21 PM

## 2020-02-05 ENCOUNTER — Inpatient Hospital Stay (HOSPITAL_COMMUNITY): Payer: Medicare Other

## 2020-02-05 ENCOUNTER — Other Ambulatory Visit: Payer: Self-pay | Admitting: Urology

## 2020-02-05 ENCOUNTER — Encounter (HOSPITAL_COMMUNITY): Payer: Self-pay | Admitting: Urology

## 2020-02-05 DIAGNOSIS — M7989 Other specified soft tissue disorders: Secondary | ICD-10-CM | POA: Diagnosis not present

## 2020-02-05 DIAGNOSIS — N390 Urinary tract infection, site not specified: Secondary | ICD-10-CM | POA: Diagnosis not present

## 2020-02-05 DIAGNOSIS — A419 Sepsis, unspecified organism: Secondary | ICD-10-CM | POA: Diagnosis not present

## 2020-02-05 LAB — CBC WITH DIFFERENTIAL/PLATELET
Abs Immature Granulocytes: 0.04 10*3/uL (ref 0.00–0.07)
Basophils Absolute: 0 10*3/uL (ref 0.0–0.1)
Basophils Relative: 1 %
Eosinophils Absolute: 0.5 10*3/uL (ref 0.0–0.5)
Eosinophils Relative: 6 %
HCT: 37.7 % (ref 36.0–46.0)
Hemoglobin: 12 g/dL (ref 12.0–15.0)
Immature Granulocytes: 1 %
Lymphocytes Relative: 24 %
Lymphs Abs: 2 10*3/uL (ref 0.7–4.0)
MCH: 32 pg (ref 26.0–34.0)
MCHC: 31.8 g/dL (ref 30.0–36.0)
MCV: 100.5 fL — ABNORMAL HIGH (ref 80.0–100.0)
Monocytes Absolute: 0.8 10*3/uL (ref 0.1–1.0)
Monocytes Relative: 10 %
Neutro Abs: 5 10*3/uL (ref 1.7–7.7)
Neutrophils Relative %: 58 %
Platelets: 218 10*3/uL (ref 150–400)
RBC: 3.75 MIL/uL — ABNORMAL LOW (ref 3.87–5.11)
RDW: 12.6 % (ref 11.5–15.5)
WBC: 8.4 10*3/uL (ref 4.0–10.5)
nRBC: 0 % (ref 0.0–0.2)

## 2020-02-05 LAB — BASIC METABOLIC PANEL
Anion gap: 7 (ref 5–15)
BUN: 10 mg/dL (ref 8–23)
CO2: 23 mmol/L (ref 22–32)
Calcium: 8.6 mg/dL — ABNORMAL LOW (ref 8.9–10.3)
Chloride: 109 mmol/L (ref 98–111)
Creatinine, Ser: 0.68 mg/dL (ref 0.44–1.00)
GFR, Estimated: >60 mL/min (ref >60)
Glucose, Bld: 103 mg/dL — ABNORMAL HIGH (ref 70–99)
Potassium: 3.8 mmol/L (ref 3.5–5.1)
Sodium: 139 mmol/L (ref 135–145)

## 2020-02-05 LAB — MAGNESIUM: Magnesium: 2.1 mg/dL (ref 1.7–2.4)

## 2020-02-05 MED ORDER — LINEZOLID 600 MG PO TABS
600.0000 mg | ORAL_TABLET | Freq: Two times a day (BID) | ORAL | Status: AC
  Administered 2020-02-05 (×2): 600 mg via ORAL
  Filled 2020-02-05 (×2): qty 1

## 2020-02-05 NOTE — Progress Notes (Addendum)
PROGRESS NOTE    Jaclyn Weaver   AGT:[id number]  DOB: 19-Jul-1930  DOA: 02/01/2020     3  PCP: Soundra Pilon, FNP  CC: fever  Hospital Course: Jaclyn Weaver is an 87 yo female with PMH severe dementia (mostly nonverbal), COPD, HTN, HLD, hypothyroidism, osteoporosis, history of blood clots (on Eliquis).  She was brought in from her SNF for new development of fever.  She had recently been treated with Zyvox p.o. after being seen in the ER on 01/28/2020 from a fall out of her wheelchair and urine culture then grew E faecalis. She had also underwent a CT head during that time which showed soft tissue swelling on her left forehead but no other obvious abnormality. During same work-up she was also found to have a 5 mm left ureteropelvic stone and was going to have outpatient intervention however due to her fever on presentation this hospitalization she underwent repeat CT abdomen/pelvis and this showed an obstructing 7 mm calculus at the left ureteropelvic junction.  She was taken to the OR and underwent stent placement in the left ureter. She was covered with Zyvox for her previous urine culture just prior to admission which grew E faecalis.  During hospitalization her current urine culture also grew Pseudomonas and she was started on cefepime for treatment of this.   Interval History:  Remains nonverbal. No events overnight. She is on dys 3 diet but still had some potential aspiration with lunch today.   Old records reviewed in assessment of this patient  ROS: Review of systems not obtained due to patient factors.  Severe dementia  Assessment & Plan: * Sepsis (HCC)-resolved as of 02/05/2020 -Tachycardic, tachypneic, leukocytosis; source considered urinary -See complicated UTI  Ureteral stone with hydronephrosis -See complicated UTI  Complicated UTI (urinary tract infection) -CT abdomen/pelvis this hospitalization shows left ureteropelvic stone measuring 7 mm with obstruction -Underwent  left ureter stent placement with urology on admission -Initially started on vancomycin for E faecalis on prior UCx and was on Zyvox outpatient. Restarted back on Zyvox on 02/02/2020 to target E faecalis which was growing on urine culture on 01/27/2020.  New urine culture this hospitalization now growing Pseudomonas (being treated with cefepime). 7 days Zyvox/Vanc will be completed on 1/10, no further treatment for E. Faecalis after this.  - day 3 of uninterrupted cefepime; plan is 7 days total treatment; likely can d/c on Tuesday and finish course with cipro (give 4th dose prior to discharge) and only should need 3 more days treatment.  Swelling of forearm -Swelling noted in right forearm.  IV also in the same arm -Duplex obtained on 02/05/2020, negative for SVT or DVT -Peripheral IV being attempted to move to left arm  Dementia (HCC) -Severe -Patient is nonverbal at baseline and only follows a few commands -SLP eval completed on 1/7: dys 3 diet and meds with ice cream or applesauce and monitored eating - palliative care consulted given potential aspiration event on 1/10 when eating; still likely can discharge on Tuesday with outpt involvement as well   COPD (chronic obstructive pulmonary disease) (HCC) -Lungs clear.  No signs of exacerbation at this time   Antimicrobials: Cefepime 02/01/20 x 1 Vanc 02/01/20 x 1 Zyvox 02/01/20>>current  Cefepime 02/03/2020 >> current    DVT prophylaxis: Eliquis Code Status: [name] Family Communication: none present Disposition Plan: Status is: Inpatient  Remains inpatient appropriate because:Ongoing diagnostic testing needed not appropriate for outpatient work up, IV treatments appropriate due to intensity of illness or inability to  take PO and Inpatient level of care appropriate due to severity of illness   Dispo: The patient is from: SNF              Anticipated d/c is to: SNF              Anticipated d/c date is: 1/11              Patient currently is not  medically stable to d/c.   Objective: Blood pressure (!) 158/87, pulse 88, temperature 98.3 F (36.8 C), temperature source Oral, resp. rate (!) 32, height 5\' 4"  (1.626 m), weight 58.9 kg, SpO2 97 %.  Examination: General appearance: Pleasant elderly woman resting in bed in no distress. Nodding head easier today but still pretty nonverbal overall  Head: Left forehead swelling with bruising appreciated Eyes: Pupils equally round, EOMI Lungs: clear to auscultation bilaterally Heart: regular rate and rhythm and S1, S2 normal Abdomen: normal findings: bowel sounds normal and soft, non-tender Extremities: edema noted in RUE Skin: mobility and turgor normal Neurologic: Moves all 4 extremities. Weaker in LLE  Follows some commands.  Consultants:   Urology  Procedures:   Left ureteral stent placement, 02/02/2020  Data Reviewed: I have personally reviewed following labs and imaging studies Results for orders placed or performed during the hospital encounter of 02/01/20 (from the past 24 hour(s))  Basic metabolic panel     Status: Abnormal   Collection Time: 02/05/20  5:42 AM  Result Value Ref Range   Sodium 139 135 - 145 mmol/L   Potassium 3.8 3.5 - 5.1 mmol/L   Chloride 109 98 - 111 mmol/L   CO2 23 22 - 32 mmol/L   Glucose, Bld 103 (H) 70 - 99 mg/dL   BUN 10 8 - 23 mg/dL   Creatinine, Ser 04/04/20 0.44 - 1.00 mg/dL   Calcium 8.6 (L) 8.9 - 10.3 mg/dL   GFR, Estimated 9.38 >18 mL/min   Anion gap 7 5 - 15  CBC with Differential/Platelet     Status: Abnormal   Collection Time: 02/05/20  5:42 AM  Result Value Ref Range   WBC 8.4 4.0 - 10.5 K/uL   RBC 3.75 (L) 3.87 - 5.11 MIL/uL   Hemoglobin 12.0 12.0 - 15.0 g/dL   HCT 04/04/20 93.7 - 16.9 %   MCV 100.5 (H) 80.0 - 100.0 fL   MCH 32.0 26.0 - 34.0 pg   MCHC 31.8 30.0 - 36.0 g/dL   RDW 67.8 93.8 - 10.1 %   Platelets 218 150 - 400 K/uL   nRBC 0.0 0.0 - 0.2 %   Neutrophils Relative % 58 %   Neutro Abs 5.0 1.7 - 7.7 K/uL   Lymphocytes  Relative 24 %   Lymphs Abs 2.0 0.7 - 4.0 K/uL   Monocytes Relative 10 %   Monocytes Absolute 0.8 0.1 - 1.0 K/uL   Eosinophils Relative 6 %   Eosinophils Absolute 0.5 0.0 - 0.5 K/uL   Basophils Relative 1 %   Basophils Absolute 0.0 0.0 - 0.1 K/uL   Immature Granulocytes 1 %   Abs Immature Granulocytes 0.04 0.00 - 0.07 K/uL  Magnesium     Status: None   Collection Time: 02/05/20  5:42 AM  Result Value Ref Range   Magnesium 2.1 1.7 - 2.4 mg/dL    Recent Results (from the past 240 hour(s))  Resp Panel by RT-PCR (Flu A&B, Covid) Nasopharyngeal Swab     Status: None   Collection Time: 01/27/20  1:08  PM   Specimen: Nasopharyngeal Swab; Nasopharyngeal(NP) swabs in vial transport medium  Result Value Ref Range Status   SARS Coronavirus 2 by RT PCR NEGATIVE NEGATIVE Final    Comment: (NOTE) SARS-CoV-2 target nucleic acids are NOT DETECTED.  The SARS-CoV-2 RNA is generally detectable in upper respiratory specimens during the acute phase of infection. The lowest concentration of SARS-CoV-2 viral copies this assay can detect is 138 copies/mL. A negative result does not preclude SARS-Cov-2 infection and should not be used as the sole basis for treatment or other patient management decisions. A negative result may occur with  improper specimen collection/handling, submission of specimen other than nasopharyngeal swab, presence of viral mutation(s) within the areas targeted by this assay, and inadequate number of viral copies(<138 copies/mL). A negative result must be combined with clinical observations, patient history, and epidemiological information. The expected result is Negative.  Fact Sheet for Patients:  BloggerCourse.com  Fact Sheet for Healthcare Providers:  SeriousBroker.it  This test is no t yet approved or cleared by the [place] FDA and  has been authorized for detection and/or diagnosis of SARS-CoV-2 by FDA under an  Emergency Use Authorization (EUA). This EUA will remain  in effect (meaning this test can be used) for the duration of the COVID-19 declaration under Section 564(b)(1) of the Act, 21 U.S.C.section 360bbb-3(b)(1), unless the authorization is terminated  or revoked sooner.       Influenza A by PCR NEGATIVE NEGATIVE Final   Influenza B by PCR NEGATIVE NEGATIVE Final    Comment: (NOTE) The Xpert Xpress SARS-CoV-2/FLU/RSV plus assay is intended as an aid in the diagnosis of influenza from Nasopharyngeal swab specimens and should not be used as a sole basis for treatment. Nasal washings and aspirates are unacceptable for Xpert Xpress SARS-CoV-2/FLU/RSV testing.  Fact Sheet for Patients: BloggerCourse.com  Fact Sheet for Healthcare Providers: SeriousBroker.it  This test is not yet approved or cleared by the [place] FDA and has been authorized for detection and/or diagnosis of SARS-CoV-2 by FDA under an Emergency Use Authorization (EUA). This EUA will remain in effect (meaning this test can be used) for the duration of the COVID-19 declaration under Section 564(b)(1) of the Act, 21 U.S.C. section 360bbb-3(b)(1), unless the authorization is terminated or revoked.  Performed at [facility], [address], [place], [place] [address]   Urine Culture     Status: Abnormal   Collection Time: 01/27/20  1:08 PM   Specimen: Urine, Catheterized  Result Value Ref Range Status   Specimen Description   Final    URINE, CATHETERIZED Performed at [facility], [address] [place], [place] [address]    Special Requests   Final    NONE Performed at [facility], [address], [place], [place] [address]    Culture >=100,000 COLONIES/mL ENTEROCOCCUS FAECALIS (A)  Final   Report Status 01/29/2020 FINAL  Final   Organism ID, Bacteria ENTEROCOCCUS FAECALIS (A)  Final       Susceptibility   Enterococcus faecalis - MIC*    AMPICILLIN <=2 SENSITIVE Sensitive     NITROFURANTOIN <=16 SENSITIVE Sensitive     VANCOMYCIN 1 SENSITIVE Sensitive     * >=100,000 COLONIES/mL ENTEROCOCCUS FAECALIS  Blood Culture (routine x 2)     Status: None (Preliminary result)   Collection Time: 02/01/20 10:35 PM   Specimen: BLOOD RIGHT FOREARM  Result Value Ref Range Status   Specimen Description   Final    BLOOD RIGHT  FOREARM Performed at [facility], [address] [place], [place] [address]    Special Requests   Final    BOTTLES DRAWN AEROBIC AND ANAEROBIC Blood Culture results may not be optimal due to an inadequate volume of blood received in culture bottles Performed at [facility], [address] [place], [place] [address]    Culture   Final    NO GROWTH 3 DAYS Performed at [facility], [address] [place], [place] [address]    Report Status PENDING  Incomplete  Resp Panel by RT-PCR (Flu A&B, Covid) In/Out Cath Urine     Status: None   Collection Time: 02/01/20 11:05 PM   Specimen: In/Out Cath Urine; Nasopharyngeal(NP) swabs in vial transport medium  Result Value Ref Range Status   SARS Coronavirus 2 by RT PCR NEGATIVE NEGATIVE Final    Comment: (NOTE) SARS-CoV-2 target nucleic acids are NOT DETECTED.  The SARS-CoV-2 RNA is generally detectable in upper respiratory specimens during the acute phase of infection. The lowest concentration of SARS-CoV-2 viral copies this assay can detect is 138 copies/mL. A negative result does not preclude SARS-Cov-2 infection and should not be used as the sole basis for treatment or other patient management decisions. A negative result may occur with  improper specimen collection/handling, submission of specimen other than nasopharyngeal swab, presence of viral mutation(s) within the areas targeted by this assay, and inadequate number of viral copies(<138 copies/mL). A negative  result must be combined with clinical observations, patient history, and epidemiological information. The expected result is Negative.  Fact Sheet for Patients:  BloggerCourse.comhttps://www.fda.gov/media/152166/download  Fact Sheet for Healthcare Providers:  SeriousBroker.ithttps://www.fda.gov/media/152162/download  This test is no t yet approved or cleared by the [place] FDA and  has been authorized for detection and/or diagnosis of SARS-CoV-2 by FDA under an Emergency Use Authorization (EUA). This EUA will remain  in effect (meaning this test can be used) for the duration of the COVID-19 declaration under Section 564(b)(1) of the Act, 21 U.S.C.section 360bbb-3(b)(1), unless the authorization is terminated  or revoked sooner.       Influenza A by PCR NEGATIVE NEGATIVE Final   Influenza B by PCR NEGATIVE NEGATIVE Final    Comment: (NOTE) The Xpert Xpress SARS-CoV-2/FLU/RSV plus assay is intended as an aid in the diagnosis of influenza from Nasopharyngeal swab specimens and should not be used as a sole basis for treatment. Nasal washings and aspirates are unacceptable for Xpert Xpress SARS-CoV-2/FLU/RSV testing.  Fact Sheet for Patients: BloggerCourse.comhttps://www.fda.gov/media/152166/download  Fact Sheet for Healthcare Providers: SeriousBroker.ithttps://www.fda.gov/media/152162/download  This test is not yet approved or cleared by the [place] FDA and has been authorized for detection and/or diagnosis of SARS-CoV-2 by FDA under an Emergency Use Authorization (EUA). This EUA will remain in effect (meaning this test can be used) for the duration of the COVID-19 declaration under Section 564(b)(1) of the Act, 21 U.S.C. section 360bbb-3(b)(1), unless the authorization is terminated or revoked.  Performed at [facility], [address] [place], [place] [address]   Urine culture     Status: Abnormal   Collection Time: 02/01/20 11:26 PM   Specimen: In/Out Cath Urine  Result Value Ref Range Status    Specimen Description   Final    IN/OUT CATH URINE Performed at [facility], [address] [place], [place] [address]    Special Requests   Final    NONE Performed at [facility], [address]  [place], [place] [address]    Culture >=100,000 COLONIES/mL PSEUDOMONAS AERUGINOSA (A)  Final   Report Status 02/04/2020 FINAL  Final   Organism ID, Bacteria PSEUDOMONAS AERUGINOSA (A)  Final      Susceptibility   Pseudomonas aeruginosa - MIC*    CEFTAZIDIME 2 SENSITIVE Sensitive     CIPROFLOXACIN 1 SENSITIVE Sensitive     GENTAMICIN 4 SENSITIVE Sensitive     IMIPENEM 2 SENSITIVE Sensitive     PIP/TAZO <=4 SENSITIVE Sensitive     * >=100,000 COLONIES/mL PSEUDOMONAS AERUGINOSA  Blood Culture (routine x 2)     Status: None (Preliminary result)   Collection Time: 02/02/20  1:17 AM   Specimen: BLOOD  Result Value Ref Range Status   Specimen Description   Final    BLOOD RIGHT ANTECUBITAL Performed at [facility], [address] [place], [place] [address]    Special Requests   Final    BOTTLES DRAWN AEROBIC AND ANAEROBIC Blood Culture adequate volume Performed at [facility], [address] [place], [place] [address]    Culture   Final    NO GROWTH 3 DAYS Performed at [facility], [address] [place], [place] [address]    Report Status PENDING  Incomplete  Urine Culture     Status: None   Collection Time: 02/02/20  4:26 AM   Specimen: Urine, Cystoscope  Result Value Ref Range Status   Specimen Description   Final    CYSTOSCOPY LT RENAL PELCIS Performed at [facility], [address] [place], [place] [address]    Special Requests   Final    PATIENT ON FOLLOWING Clement HusbandsVANC, MAXIPIME Performed at [facility], [address] [place], [place] [address]    Culture   Final    NO GROWTH Performed at [facility], [address] [place], [place]  [address]    Report Status 02/03/2020 FINAL  Final     Radiology Studies: VAS US UPPER EXTREMITY VENOUS DUPLEX  Result Date: 02/05/2020 UPPER VENOUS STUDY  Indications: Swelling Risk Factors: None identified. Limitations: Patient unable to stop coughing throughout exam. Comparison Study: No previous exam Performing Technologist: Clint GuyLisa [place] RVT  Examination Guidelines: A complete evaluation includes B-mode imaging, spectral Doppler, color Doppler, and power Doppler as needed of all accessible portions of each vessel. Bilateral testing is considered an integral part of a complete examination. Limited examinations for reoccurring indications may be performed as noted.  Right Findings: +----------+------------+---------+-----------+----------+-------+ RIGHT     CompressiblePhasicitySpontaneousPropertiesSummary +----------+------------+---------+-----------+----------+-------+ IJV           Full       Yes       Yes                      +----------+------------+---------+-----------+----------+-------+ Subclavian               Yes       Yes                      +----------+------------+---------+-----------+----------+-------+ Axillary      Full       Yes       Yes                      +----------+------------+---------+-----------+----------+-------+ Brachial      Full       Yes       Yes                      +----------+------------+---------+-----------+----------+-------+  Radial        Full                                          +----------+------------+---------+-----------+----------+-------+ Ulnar         Full                                          +----------+------------+---------+-----------+----------+-------+ Cephalic      Full                                          +----------+------------+---------+-----------+----------+-------+ Basilic       Full                                           +----------+------------+---------+-----------+----------+-------+  Summary:  Right: No evidence of deep vein thrombosis in the upper extremity. No evidence of superficial vein thrombosis in the upper extremity.  *See table(s) above for measurements and observations.    Preliminary    VAS [place] UPPER EXTREMITY VENOUS DUPLEX      DG C-Arm 1-60 Min-No Report  Final Result    CT Angio Chest PE W and/or Wo Contrast  Final Result    CT ABDOMEN PELVIS W CONTRAST  Final Result    DG Chest Port 1 View  Final Result      Scheduled Meds: . apixaban  5 mg Oral BID  . levothyroxine  75 mcg Oral QAC breakfast  . linezolid  600 mg Oral Q12H  . mouth rinse  15 mL Mouth Rinse BID  . melatonin  3 mg Oral QHS  . mometasone-formoterol  2 puff Inhalation BID  . simvastatin  10 mg Oral QODAY  . tamsulosin  0.4 mg Oral QHS   PRN Meds: acetaminophen **OR** acetaminophen, albuterol, morphine injection, ondansetron **OR** ondansetron (ZOFRAN) IV, senna-docusate Continuous Infusions: . sodium chloride Stopped (02/05/20 1244)  . ceFEPime (MAXIPIME) IV 2 g (02/04/20 1624)     LOS: 3 days  Time spent: Greater than 50% of the 35 minute visit was spent in counseling/coordination of care for the patient as laid out in the A&P.   Lewie Chamber, MD [facility] 02/05/2020, 3:12 PM

## 2020-02-05 NOTE — TOC Progression Note (Addendum)
Transition of Care [facility]) - Progression Note    Patient Details  Name: Jaclyn Weaver MRN: [id number] Date of Birth: [birth year]  [facility]) CM/SW Contact  Cathleen Yagi, Olegario Messier, RN Phone Number: 02/05/2020, 12:46 PM  Clinical Narrative:   [facility] [facility] care-spoke to [facility] director will review for acceptance back likely d/c in am. covid to be ordered.Already w/c bound. No PT order needed.    Expected Discharge Plan: Memory Care Barriers to Discharge: Continued Medical Work up  Expected Discharge Plan and Services Expected Discharge Plan: Memory Care   Discharge Planning Services: CM Consult   Living arrangements for the past 2 months: Post-Acute Facility                                       Social Determinants of Health (SDOH) Interventions    Readmission Risk Interventions No flowsheet data found.

## 2020-02-05 NOTE — Anesthesia Postprocedure Evaluation (Signed)
Anesthesia Post Note  Patient: Jaclyn Weaver  Procedure(s) Performed: CYSTOSCOPY WITH RETROGRADE PYELOGRAM/URETERAL STENT PLACEMENT (Left Ureter)     Patient location during evaluation: PACU Anesthesia Type: General Level of consciousness: awake and alert Pain management: pain level controlled Vital Signs Assessment: post-procedure vital signs reviewed and stable Respiratory status: spontaneous breathing, nonlabored ventilation, respiratory function stable and patient connected to nasal cannula oxygen Cardiovascular status: blood pressure returned to baseline and stable Postop Assessment: no apparent nausea or vomiting Anesthetic complications: no   No complications documented.  Last Vitals:  Vitals:   02/05/20 0511 02/05/20 0818  BP: (!) 143/70   Pulse: 70   Resp: 20   Temp: 36.6 C   SpO2: 98% 92%    Last Pain:  Vitals:   02/04/20 2053  TempSrc:   PainSc: 0-No pain                 Avontae Burkhead S

## 2020-02-05 NOTE — Progress Notes (Signed)
Right upper extremity venous study completed.      Please see CV Proc for preliminary results.   Lakshmi Sundeen, RVT  

## 2020-02-05 NOTE — Care Management Important Message (Signed)
Important Message  Patient Details IM Letter given to the Patient. Name: Jaclyn Weaver MRN: [id number] Date of Birth: 04/18/1930   Medicare Important Message Given:  Yes     [name] 02/05/2020, 12:44 PM

## 2020-02-05 NOTE — Assessment & Plan Note (Signed)
-  Swelling noted in right forearm.  IV also in the same arm -Duplex obtained on 02/05/2020, negative for SVT or DVT -Peripheral IV being attempted to move to left arm

## 2020-02-05 NOTE — Progress Notes (Signed)
3 Days Post-Op Subjective: Pain appears controlled. No nausea or emesis. Afebrile.  Objective: Vital signs in last 24 hours: Temp:  [97.9 F (36.6 C)-98.8 F (37.1 C)] 97.9 F (36.6 C) (01/10 0511) Pulse Rate:  [70-87] 70 (01/10 0511) Resp:  [18-20] 20 (01/10 0511) BP: (137-153)/(70-83) 143/70 (01/10 0511) SpO2:  [92 %-98 %] 92 % (01/10 0818)  Intake/Output from previous day: 01/09 0701 - 01/10 0700 In: 3032.5 [P.O.:470; I.V.:1862.5; IV Piggyback:700] Out: 1200 [Urine:1200] Intake/Output this shift: No intake/output data recorded.  Physical Exam:  General: Alert and oriented CV: RRR Lungs: Clear Abdomen: Soft, ND, NT Ext: NT, No erythema  Lab Results: Recent Labs    02/03/20 0601 02/04/20 0608 02/05/20 0542  HGB 11.4* 11.5* 12.0  HCT 34.9* 35.4* 37.7   BMET Recent Labs    02/04/20 0608 02/05/20 0542  NA 141 139  K 3.7 3.8  CL 109 109  CO2 25 23  GLUCOSE 101* 103*  BUN 14 10  CREATININE 0.67 0.68  CALCIUM 8.6* 8.6*     Studies/Results: No results found.  Assessment/Plan: #1.  Obstructing left UPJ stone: CT A/P 02/02/2020 with 7 mm left UPJ stone with moderate hydronephrosis.  Associated with fever, leukocytosis, signs of infection. S/p cysto, left RPG, left stent 02/02/2020.  -Pain control prn -Diet as tolerated -UCx 1/6 with >100K pseudomonas. L RP UCx 1/7 NG.  -Continue culture specific abx course. Treat with 7 days outpatient ahead of planned L URS/LL. -Remains afebrile, no leukocytosis. -Urine clear yellow in tubing of purewick. Pink tinge in cannister. -Hgb appropriate at 12 -Messaged schedulers to arrange outpatient f/u with Dr. Berneice Heinrich for preop for L URS/LL -Following peripherally. Please call with questions.   LOS: 3 days   Matt R. Simrin Vegh MD 02/05/2020, 10:30 AM [facility]  Pager: [phone]

## 2020-02-05 NOTE — Progress Notes (Signed)
   02/05/20 1312  Assess: MEWS Score  Temp 98.3 F (36.8 C)  BP (!) 158/87  Pulse Rate 88  Resp (!) 32  Level of Consciousness Alert  SpO2 97 %  O2 Device Room Air  Assess: MEWS Score  MEWS Temp 0  MEWS Systolic 0  MEWS Pulse 0  MEWS RR 2  MEWS LOC 0  MEWS Score 2  MEWS Score Color Yellow  Assess: if the MEWS score is Yellow or Red  Were vital signs taken at a resting state? Yes  Focused Assessment No change from prior assessment  Early Detection of Sepsis Score *See Row Information* Low  MEWS guidelines implemented *See Row Information* Yes  Treat  MEWS Interventions Escalated (See documentation below)  Pain Scale PAINAD  Pain Score 0  Breathing 0  Negative Vocalization 0  Facial Expression 0  Body Language 0  Consolability 0  PAINAD Score 0  Take Vital Signs  Increase Vital Sign Frequency  Yellow: Q 2hr X 2 then Q 4hr X 2, if remains yellow, continue Q 4hrs  Escalate  MEWS: Escalate Yellow: discuss with charge nurse/RN and consider discussing with provider and RRT  Notify: Charge Nurse/RN  Name of Charge Nurse/RN Notified Lyndsey Currin  Date Charge Nurse/RN Notified 02/05/20  Time Charge Nurse/RN Notified 1350  Document  Progress note created (see row info) Yes

## 2020-02-06 DIAGNOSIS — F0280 Dementia in other diseases classified elsewhere without behavioral disturbance: Secondary | ICD-10-CM

## 2020-02-06 DIAGNOSIS — J449 Chronic obstructive pulmonary disease, unspecified: Secondary | ICD-10-CM

## 2020-02-06 DIAGNOSIS — G309 Alzheimer's disease, unspecified: Secondary | ICD-10-CM | POA: Diagnosis not present

## 2020-02-06 DIAGNOSIS — A419 Sepsis, unspecified organism: Secondary | ICD-10-CM | POA: Diagnosis not present

## 2020-02-06 DIAGNOSIS — N390 Urinary tract infection, site not specified: Secondary | ICD-10-CM

## 2020-02-06 DIAGNOSIS — N132 Hydronephrosis with renal and ureteral calculous obstruction: Secondary | ICD-10-CM

## 2020-02-06 DIAGNOSIS — M7989 Other specified soft tissue disorders: Secondary | ICD-10-CM

## 2020-02-06 LAB — SARS CORONAVIRUS 2 (TAT 6-24 HRS): SARS Coronavirus 2: NEGATIVE

## 2020-02-06 LAB — CBC WITH DIFFERENTIAL/PLATELET
Abs Immature Granulocytes: 0.04 10*3/uL (ref 0.00–0.07)
Basophils Absolute: 0 10*3/uL (ref 0.0–0.1)
Basophils Relative: 1 %
Eosinophils Absolute: 0.5 10*3/uL (ref 0.0–0.5)
Eosinophils Relative: 6 %
HCT: 37.8 % (ref 36.0–46.0)
Hemoglobin: 12.3 g/dL (ref 12.0–15.0)
Immature Granulocytes: 1 %
Lymphocytes Relative: 26 %
Lymphs Abs: 2 10*3/uL (ref 0.7–4.0)
MCH: 32.2 pg (ref 26.0–34.0)
MCHC: 32.5 g/dL (ref 30.0–36.0)
MCV: 99 fL (ref 80.0–100.0)
Monocytes Absolute: 0.7 10*3/uL (ref 0.1–1.0)
Monocytes Relative: 9 %
Neutro Abs: 4.5 10*3/uL (ref 1.7–7.7)
Neutrophils Relative %: 57 %
Platelets: 243 10*3/uL (ref 150–400)
RBC: 3.82 MIL/uL — ABNORMAL LOW (ref 3.87–5.11)
RDW: 12.7 % (ref 11.5–15.5)
WBC: 7.8 10*3/uL (ref 4.0–10.5)
nRBC: 0 % (ref 0.0–0.2)

## 2020-02-06 LAB — BASIC METABOLIC PANEL
Anion gap: 6 (ref 5–15)
BUN: 13 mg/dL (ref 8–23)
CO2: 23 mmol/L (ref 22–32)
Calcium: 8.9 mg/dL (ref 8.9–10.3)
Chloride: 108 mmol/L (ref 98–111)
Creatinine, Ser: 0.66 mg/dL (ref 0.44–1.00)
GFR, Estimated: >60 mL/min (ref >60)
Glucose, Bld: 110 mg/dL — ABNORMAL HIGH (ref 70–99)
Potassium: 3.6 mmol/L (ref 3.5–5.1)
Sodium: 137 mmol/L (ref 135–145)

## 2020-02-06 LAB — MAGNESIUM: Magnesium: 2 mg/dL (ref 1.7–2.4)

## 2020-02-06 MED ORDER — CIPROFLOXACIN HCL 500 MG PO TABS: 500.0000 mg | ORAL_TABLET | Freq: Two times a day (BID) | ORAL | 0 refills | Status: DC

## 2020-02-06 NOTE — Progress Notes (Signed)
Report called to [place] at [facility]. Facility requesting FL2 be resent saying they never received it. Jaclyn Weaver, CM aware and will resend. Awaiting PTAR to transport. Will continue to monitor.

## 2020-02-06 NOTE — TOC Progression Note (Signed)
Transition of Care [facility]) - Progression Note    Patient Details  Name: Jaclyn Weaver MRN: [id number] Date of Birth: Oct 27, 1930  Transition [facility]) CM/SW Contact  Leasa Kincannon, Olegario Messier, RN Phone Number: 02/06/2020, 1:19 PM  Clinical Narrative: fl2 was sent via hub already-have also put a manual fl2 in the packet. No further CM needs.      Expected Discharge Plan: Memory Care Barriers to Discharge: No Barriers Identified  Expected Discharge Plan and Services Expected Discharge Plan: Memory Care   Discharge Planning Services: CM Consult Post Acute Care Choice: Nursing Home (Memory Care) Living arrangements for the past 2 months: Post-Acute Facility Expected Discharge Date: 02/06/20                                     Social Determinants of Health (SDOH) Interventions    Readmission Risk Interventions No flowsheet data found.

## 2020-02-06 NOTE — Discharge Summary (Signed)
Physician Discharge Summary  Jaclyn Weaver NFA:[id number] DOB: 1930-04-29 DOA: 02/01/2020  PCP: Jaclyn Pilon, FNP  Admit date: 02/01/2020 Discharge date: 02/06/2020  Admitted From: SNF  Discharge disposition: SNF   Recommendations for Outpatient Follow-Up:   . Follow up with your primary care provider at the skilled nursing facility in 3 to 5 days . Check CBC, BMP, magnesium in the next visit . Patient will need to follow-up with [facility] at the schedule by the clinic.   Discharge Diagnosis:   Active Problems:   Complicated UTI (urinary tract infection)   Ureteral stone with hydronephrosis   Dementia (HCC)   COPD (chronic obstructive pulmonary disease) (HCC)   Swelling of forearm  Discharge Condition: Improved.  Diet recommendation: Low sodium, heart healthy.  Dysphagia 3 diet   Wound care: None.  Code status: [name]   History of Present Illness:   Jaclyn Weaver is an 87 yo female with PMH severe dementia (mostly nonverbal), COPD, HTN, HLD, hypothyroidism, osteoporosis, history of blood clots (on Eliquis) was brought in from her SNF for new development of fever.  She had recently been treated with Zyvox p.o. after being seen in the ER on 01/28/2020 from a fall out of her wheelchair and urine culture then grew E faecalis. She had also underwent a CT head during that time which showed soft tissue swelling on her left forehead but no other obvious abnormality. During same work-up she was also found to have a 5 mm left ureteropelvic stone and was going to have outpatient intervention however due to her fever on presentation this hospitalization, she underwent repeat CT abdomen/pelvis and this showed an obstructing 7 mm calculus at the left ureteropelvic junction.  She was taken to the OR and underwent stent placement in the left ureter. She was covered with Zyvox for her previous urine culture just prior to admission which grew E faecalis.  During hospitalization,  her current urine culture also grew Pseudomonas and she was started on cefepime for treatment of this.  This will be transitioned to ciprofloxacin for 3 more days to complete 7-day course.  At this time sepsis has resolved.   Hospital Course:   Following conditions were addressed during hospitalization as listed below,  Sepsis (HCC)-resolved as of 02/05/2020 Secondary to UTI.  Ureteral stone with hydronephrosis Status post stent placement  Complicated UTI  -CT abdomen/pelvis this hospitalization shows left ureteropelvic stone measuring 7 mm with obstruction.  Patient was seen by urology and underwent a stent placement. Initially started on vancomycin for E faecalis on prior UCx and was on Zyvox outpatient. Restarted back on Zyvox on 02/02/2020 to target E faecalis which was growing on urine culture on 01/27/2020.  New urine culture this hospitalization now growing Pseudomonas (being treated with cefepime). 7 days Zyvox/Vanc  completed on 1/10, no further treatment for E. Faecalis after this.   Will continue ciprofloxacin on discharge for 3 more days to complete 7-day course.  swelling of right forearm -Duplex obtained on 02/05/2020, negative for SVT or DVT  Dementia (HCC) -Severe with nonverbal baseline.  On dysphagia 3 diet.  Patient will benefit from palliative care as outpatient.  COPD  No signs of exacerbation at this time  Disposition.  At this time, patient is stable for disposition to SNF for outpatient PCP and urology follow-up with  Medical Consultants:    Urology  Procedures:     Ureteric stent placement 02/02/2020 by urology.  Subjective:   Today, patient is nonverbal.  No  interval complaints reported by the staff.  Discharge Exam:   Vitals:   02/06/20 0457 02/06/20 0803  BP: 140/88   Pulse: 82   Resp: 18   Temp: 98.2 F (36.8 C)   SpO2: 96% 95%   Vitals:   02/05/20 2100 02/06/20 0055 02/06/20 0457 02/06/20 0803  BP: (!) 159/72 137/82 140/88   Pulse: 70  79 82   Resp: (!) 25 18 18    Temp: 98.2 F (36.8 C) 98.1 F (36.7 C) 98.2 F (36.8 C)   TempSrc: Oral     SpO2: 97% 97% 96% 95%  Weight:      Height:        General: Alert awake, but nonverbal, chronically ill, deconditioning, not in obvious distress HENT: pupils equally reacting to light,  No scleral pallor or icterus noted. Oral mucosa is moist.  Left forehead with mild bruise. Chest:  Clear breath sounds.  Diminished breath sounds bilaterally. No crackles or wheezes.  CVS: S1 &S2 heard. No murmur.  Regular rate and rhythm. Abdomen: Soft, nontender, nondistended.  Bowel sounds are heard.   Extremities: No cyanosis, clubbing or edema.  Peripheral pulses are palpable.  Upper extremity edema Psych: Alert, awake but nonverbal.  Moves extremities weaker left lower extremity. CNS:  No cranial nerve deficits.  Generalized weakness of the extremities.  More on the left. Skin: Warm and dry.  No rashes noted.  The results of significant diagnostics from this hospitalization (including imaging, microbiology, ancillary and laboratory) are listed below for reference.     Diagnostic Studies:   CT Angio Chest PE W and/or Wo Contrast  Result Date: 02/02/2020 CLINICAL DATA:  Fever, undergoing antibiotic therapy for urinary tract infection, COPD, wound drainage EXAM: CT ANGIOGRAPHY CHEST CT ABDOMEN AND PELVIS WITH CONTRAST TECHNIQUE: Multidetector CT imaging of the chest was performed using the standard protocol during bolus administration of intravenous contrast. Multiplanar CT image reconstructions and MIPs were obtained to evaluate the vascular anatomy. Multidetector CT imaging of the abdomen and pelvis was performed using the standard protocol during bolus administration of intravenous contrast. CONTRAST:  69mL OMNIPAQUE IOHEXOL 350 MG/ML SOLN COMPARISON:  CT abdomen pelvis 01/27/2020 FINDINGS: CTA CHEST FINDINGS Cardiovascular: There is adequate opacification of the pulmonary arterial tree. No  intraluminal filling defect to suggest acute pulmonary embolism. The central pulmonary arteries are of normal caliber. Mild coronary artery calcification. Global cardiac size within normal limits. No pericardial effusion. The thoracic aorta demonstrates mild atherosclerotic calcification. No aneurysm. Mediastinum/Nodes: Moderate hiatal hernia. Esophagus unremarkable. No pathologic thoracic adenopathy. Lungs/Pleura: Mild left basilar atelectasis. Lungs are otherwise clear. No pneumothorax or pleural effusion. There is central airway impaction involving the left lower lobe likely related to debris. No central obstructing mass lesion identified. Musculoskeletal: No acute bone abnormality. Review of the MIP images confirms the above findings. CT ABDOMEN and PELVIS FINDINGS Hepatobiliary: No focal liver abnormality is seen. No gallstones, gallbladder wall thickening, or biliary dilatation. Pancreas: Unremarkable Spleen: Unremarkable Adrenals/Urinary Tract: The adrenal glands are unremarkable. The kidneys are normal in size and position. Mild to moderate left hydronephrosis and mild perinephric stranding is again identified and appears unchanged with a an obstructing 7 mm calculus again identified within the left ureteropelvic junction. No additional nephro or urolithiasis. The right kidney is unremarkable. Layering hyperdense material within the bladder lumen demonstrates an altered configuration when compared to prior examination and most likely represents layering calcific debris within the bladder lumen. The bladder is otherwise unremarkable. Stomach/Bowel: Mild sigmoid diverticulosis. Moderate stool throughout  the colon. Broad-based small umbilical hernia contains a single wall of the mid transverse colon. No superimposed inflammatory change. The stomach, small bowel, and large bowel are otherwise unremarkable. Appendix normal. No free intraperitoneal gas or fluid. Vascular/Lymphatic: Mild aortoiliac atherosclerotic  calcification. No aortic aneurysm. No pathologic adenopathy within the abdomen and pelvis. Reproductive: Status post hysterectomy. No adnexal masses. Other: Small stool within the rectal vault. The rectum is otherwise unremarkable. Small supraumbilical ventral hernia is again seen containing mesenteric fat Musculoskeletal: No acute bone abnormality. Degenerative changes noted within the lumbar spine. Review of the MIP images confirms the above findings. IMPRESSION: No pulmonary embolism.  No acute intrathoracic pathology identified. Persistent moderate left hydronephrosis secondary to an obstructing 7 mm calculus at the left ureteropelvic junction. This appears unchanged from prior examination. High density material within the bladder represents layering calcific debris. Small supraumbilical ventral hernia containing mesenteric fat and umbilical hernia containing a single wall of unremarkable transverse colon. These are unchanged. Aortic Atherosclerosis (ICD10-I70.0). Electronically Signed   By: Helyn NumbersAshesh  Parikh MD   On: 02/02/2020 00:43   CT ABDOMEN PELVIS W CONTRAST  Result Date: 02/02/2020 CLINICAL DATA:  Fever, undergoing antibiotic therapy for urinary tract infection, COPD, wound drainage EXAM: CT ANGIOGRAPHY CHEST CT ABDOMEN AND PELVIS WITH CONTRAST TECHNIQUE: Multidetector CT imaging of the chest was performed using the standard protocol during bolus administration of intravenous contrast. Multiplanar CT image reconstructions and MIPs were obtained to evaluate the vascular anatomy. Multidetector CT imaging of the abdomen and pelvis was performed using the standard protocol during bolus administration of intravenous contrast. CONTRAST:  80mL OMNIPAQUE IOHEXOL 350 MG/ML SOLN COMPARISON:  CT abdomen pelvis 01/27/2020 FINDINGS: CTA CHEST FINDINGS Cardiovascular: There is adequate opacification of the pulmonary arterial tree. No intraluminal filling defect to suggest acute pulmonary embolism. The central  pulmonary arteries are of normal caliber. Mild coronary artery calcification. Global cardiac size within normal limits. No pericardial effusion. The thoracic aorta demonstrates mild atherosclerotic calcification. No aneurysm. Mediastinum/Nodes: Moderate hiatal hernia. Esophagus unremarkable. No pathologic thoracic adenopathy. Lungs/Pleura: Mild left basilar atelectasis. Lungs are otherwise clear. No pneumothorax or pleural effusion. There is central airway impaction involving the left lower lobe likely related to debris. No central obstructing mass lesion identified. Musculoskeletal: No acute bone abnormality. Review of the MIP images confirms the above findings. CT ABDOMEN and PELVIS FINDINGS Hepatobiliary: No focal liver abnormality is seen. No gallstones, gallbladder wall thickening, or biliary dilatation. Pancreas: Unremarkable Spleen: Unremarkable Adrenals/Urinary Tract: The adrenal glands are unremarkable. The kidneys are normal in size and position. Mild to moderate left hydronephrosis and mild perinephric stranding is again identified and appears unchanged with a an obstructing 7 mm calculus again identified within the left ureteropelvic junction. No additional nephro or urolithiasis. The right kidney is unremarkable. Layering hyperdense material within the bladder lumen demonstrates an altered configuration when compared to prior examination and most likely represents layering calcific debris within the bladder lumen. The bladder is otherwise unremarkable. Stomach/Bowel: Mild sigmoid diverticulosis. Moderate stool throughout the colon. Broad-based small umbilical hernia contains a single wall of the mid transverse colon. No superimposed inflammatory change. The stomach, small bowel, and large bowel are otherwise unremarkable. Appendix normal. No free intraperitoneal gas or fluid. Vascular/Lymphatic: Mild aortoiliac atherosclerotic calcification. No aortic aneurysm. No pathologic adenopathy within the  abdomen and pelvis. Reproductive: Status post hysterectomy. No adnexal masses. Other: Small stool within the rectal vault. The rectum is otherwise unremarkable. Small supraumbilical ventral hernia is again seen containing mesenteric fat Musculoskeletal: No acute  bone abnormality. Degenerative changes noted within the lumbar spine. Review of the MIP images confirms the above findings. IMPRESSION: No pulmonary embolism.  No acute intrathoracic pathology identified. Persistent moderate left hydronephrosis secondary to an obstructing 7 mm calculus at the left ureteropelvic junction. This appears unchanged from prior examination. High density material within the bladder represents layering calcific debris. Small supraumbilical ventral hernia containing mesenteric fat and umbilical hernia containing a single wall of unremarkable transverse colon. These are unchanged. Aortic Atherosclerosis (ICD10-I70.0). Electronically Signed   By: Helyn NumbersAshesh  Parikh MD   On: 02/02/2020 00:43   DG Chest Port 1 View  Result Date: 02/01/2020 CLINICAL DATA:  Sepsis EXAM: PORTABLE CHEST 1 VIEW COMPARISON:  01/27/2020 FINDINGS: The heart size and mediastinal contours are within normal limits. Both lungs are clear. The visualized skeletal structures are unremarkable. IMPRESSION: No active disease. Electronically Signed   By: Helyn NumbersAshesh  Parikh MD   On: 02/01/2020 23:04   DG C-Arm 1-60 Min-No Report  Result Date: 02/02/2020 Fluoroscopy was utilized by the requesting physician.  No radiographic interpretation.     Labs:   Basic Metabolic Panel: Recent Labs  Lab 02/02/20 0751 02/03/20 0601 02/04/20 0608 02/05/20 0542 02/06/20 0533  NA 140 139 141 139 137  K 3.8 3.6 3.7 3.8 3.6  CL 108 107 109 109 108  CO2 24 25 25 23 23   GLUCOSE 162* 122* 101* 103* 110*  BUN 12 13 14 10 13   CREATININE 0.65 0.66 0.67 0.68 0.66  CALCIUM 9.0 9.1 8.6* 8.6* 8.9  MG  --  2.1 2.0 2.1 2.0   GFR Estimated Creatinine Clearance: 42.8 mL/min (by C-G  formula based on SCr of 0.66 mg/dL). Liver Function Tests: Recent Labs  Lab 02/01/20 2235  AST 19  ALT 19  ALKPHOS 45  BILITOT 0.9  PROT 6.6  ALBUMIN 3.7   No results for input(s): LIPASE, AMYLASE in the last 168 hours. No results for input(s): AMMONIA in the last 168 hours. Coagulation profile Recent Labs  Lab 02/01/20 2235 02/02/20 0751  INR 1.3* 1.3*    CBC: Recent Labs  Lab 02/01/20 2235 02/02/20 0751 02/02/20 1736 02/03/20 0601 02/04/20 0608 02/05/20 0542 02/06/20 0533  WBC 17.5* 13.0*  --  14.0* 9.4 8.4 7.8  NEUTROABS 14.6*  --   --  11.0* 6.1 5.0 4.5  HGB 13.7 12.6 12.1 11.4* 11.5* 12.0 12.3  HCT 42.7 38.5 37.3 34.9* 35.4* 37.7 37.8  MCV 99.8 99.5  --  98.9 97.8 100.5* 99.0  PLT 264 208  --  218 230 218 243   Cardiac Enzymes: No results for input(s): CKTOTAL, CKMB, CKMBINDEX, TROPONINI in the last 168 hours. BNP: Invalid input(s): POCBNP CBG: No results for input(s): GLUCAP in the last 168 hours. D-Dimer No results for input(s): DDIMER in the last 72 hours. Hgb A1c No results for input(s): HGBA1C in the last 72 hours. Lipid Profile No results for input(s): CHOL, HDL, LDLCALC, TRIG, CHOLHDL, LDLDIRECT in the last 72 hours. Thyroid function studies No results for input(s): TSH, T4TOTAL, T3FREE, THYROIDAB in the last 72 hours.  Invalid input(s): FREET3 Anemia work up No results for input(s): VITAMINB12, FOLATE, FERRITIN, TIBC, IRON, RETICCTPCT in the last 72 hours. Microbiology Recent Results (from the past 240 hour(s))  Resp Panel by RT-PCR (Flu A&B, Covid) Nasopharyngeal Swab     Status: None   Collection Time: 01/27/20  1:08 PM   Specimen: Nasopharyngeal Swab; Nasopharyngeal(NP) swabs in vial transport medium  Result Value Ref Range Status  SARS Coronavirus 2 by RT PCR NEGATIVE NEGATIVE Final    Comment: (NOTE) SARS-CoV-2 target nucleic acids are NOT DETECTED.  The SARS-CoV-2 RNA is generally detectable in upper respiratory specimens during  the acute phase of infection. The lowest concentration of SARS-CoV-2 viral copies this assay can detect is 138 copies/mL. A negative result does not preclude SARS-Cov-2 infection and should not be used as the sole basis for treatment or other patient management decisions. A negative result may occur with  improper specimen collection/handling, submission of specimen other than nasopharyngeal swab, presence of viral mutation(s) within the areas targeted by this assay, and inadequate number of viral copies(<138 copies/mL). A negative result must be combined with clinical observations, patient history, and epidemiological information. The expected result is Negative.  Fact Sheet for Patients:  BloggerCourse.com  Fact Sheet for Healthcare Providers:  SeriousBroker.it  This test is no t yet approved or cleared by the [place] FDA and  has been authorized for detection and/or diagnosis of SARS-CoV-2 by FDA under an Emergency Use Authorization (EUA). This EUA will remain  in effect (meaning this test can be used) for the duration of the COVID-19 declaration under Section 564(b)(1) of the Act, 21 U.S.C.section 360bbb-3(b)(1), unless the authorization is terminated  or revoked sooner.       Influenza A by PCR NEGATIVE NEGATIVE Final   Influenza B by PCR NEGATIVE NEGATIVE Final    Comment: (NOTE) The Xpert Xpress SARS-CoV-2/FLU/RSV plus assay is intended as an aid in the diagnosis of influenza from Nasopharyngeal swab specimens and should not be used as a sole basis for treatment. Nasal washings and aspirates are unacceptable for Xpert Xpress SARS-CoV-2/FLU/RSV testing.  Fact Sheet for Patients: BloggerCourse.com  Fact Sheet for Healthcare Providers: SeriousBroker.it  This test is not yet approved or cleared by the [place] FDA and has been authorized for detection and/or  diagnosis of SARS-CoV-2 by FDA under an Emergency Use Authorization (EUA). This EUA will remain in effect (meaning this test can be used) for the duration of the COVID-19 declaration under Section 564(b)(1) of the Act, 21 U.S.C. section 360bbb-3(b)(1), unless the authorization is terminated or revoked.  Performed at [facility], [address] [place], [place] [address]   Urine Culture     Status: Abnormal   Collection Time: 01/27/20  1:08 PM   Specimen: Urine, Catheterized  Result Value Ref Range Status   Specimen Description   Final    URINE, CATHETERIZED Performed at [facility], [address], [place], [place] [address]    Special Requests   Final    NONE Performed at [facility], [address], [place], [place] [address]    Culture >=100,000 COLONIES/mL ENTEROCOCCUS FAECALIS (A)  Final   Report Status 01/29/2020 FINAL  Final   Organism ID, Bacteria ENTEROCOCCUS FAECALIS (A)  Final      Susceptibility   Enterococcus faecalis - MIC*    AMPICILLIN <=2 SENSITIVE Sensitive     NITROFURANTOIN <=16 SENSITIVE Sensitive     VANCOMYCIN 1 SENSITIVE Sensitive     * >=100,000 COLONIES/mL ENTEROCOCCUS FAECALIS  Blood Culture (routine x 2)     Status: None (Preliminary result)   Collection Time: 02/01/20 10:35 PM   Specimen: BLOOD RIGHT FOREARM  Result Value Ref Range Status   Specimen Description   Final    BLOOD RIGHT FOREARM Performed at [facility], [address] [place], [place] [address]    Special Requests  Final    BOTTLES DRAWN AEROBIC AND ANAEROBIC Blood Culture results may not be optimal due to an inadequate volume of blood received in culture bottles Performed at [facility], [address], [place], [place] [address]    Culture   Final    NO GROWTH 4 DAYS Performed at [facility], [address], [place], [place] [address]    Report Status PENDING   Incomplete  Resp Panel by RT-PCR (Flu A&B, Covid) In/Out Cath Urine     Status: None   Collection Time: 02/01/20 11:05 PM   Specimen: In/Out Cath Urine; Nasopharyngeal(NP) swabs in vial transport medium  Result Value Ref Range Status   SARS Coronavirus 2 by RT PCR NEGATIVE NEGATIVE Final    Comment: (NOTE) SARS-CoV-2 target nucleic acids are NOT DETECTED.  The SARS-CoV-2 RNA is generally detectable in upper respiratory specimens during the acute phase of infection. The lowest concentration of SARS-CoV-2 viral copies this assay can detect is 138 copies/mL. A negative result does not preclude SARS-Cov-2 infection and should not be used as the sole basis for treatment or other patient management decisions. A negative result may occur with  improper specimen collection/handling, submission of specimen other than nasopharyngeal swab, presence of viral mutation(s) within the areas targeted by this assay, and inadequate number of viral copies(<138 copies/mL). A negative result must be combined with clinical observations, patient history, and epidemiological information. The expected result is Negative.  Fact Sheet for Patients:  BloggerCourse.com  Fact Sheet for Healthcare Providers:  SeriousBroker.it  This test is no t yet approved or cleared by the [place] FDA and  has been authorized for detection and/or diagnosis of SARS-CoV-2 by FDA under an Emergency Use Authorization (EUA). This EUA will remain  in effect (meaning this test can be used) for the duration of the COVID-19 declaration under Section 564(b)(1) of the Act, 21 U.S.C.section 360bbb-3(b)(1), unless the authorization is terminated  or revoked sooner.       Influenza A by PCR NEGATIVE NEGATIVE Final   Influenza B by PCR NEGATIVE NEGATIVE Final    Comment: (NOTE) The Xpert Xpress SARS-CoV-2/FLU/RSV plus assay is intended as an aid in the diagnosis of influenza from  Nasopharyngeal swab specimens and should not be used as a sole basis for treatment. Nasal washings and aspirates are unacceptable for Xpert Xpress SARS-CoV-2/FLU/RSV testing.  Fact Sheet for Patients: BloggerCourse.com  Fact Sheet for Healthcare Providers: SeriousBroker.it  This test is not yet approved or cleared by the [place] FDA and has been authorized for detection and/or diagnosis of SARS-CoV-2 by FDA under an Emergency Use Authorization (EUA). This EUA will remain in effect (meaning this test can be used) for the duration of the COVID-19 declaration under Section 564(b)(1) of the Act, 21 U.S.C. section 360bbb-3(b)(1), unless the authorization is terminated or revoked.  Performed at [facility], [address], [place], [place] [address]   Urine culture     Status: Abnormal   Collection Time: 02/01/20 11:26 PM   Specimen: In/Out Cath Urine  Result Value Ref Range Status   Specimen Description   Final    IN/OUT CATH URINE Performed at [facility], [address], [place], [place] [address]    Special Requests   Final    NONE Performed at [facility], [address], [place], [place] [address]    Culture >=100,000 COLONIES/mL PSEUDOMONAS AERUGINOSA (A)  Final   Report Status 02/04/2020 FINAL  Final   Organism ID, Bacteria PSEUDOMONAS AERUGINOSA (A)  Final      Susceptibility   Pseudomonas aeruginosa - MIC*    CEFTAZIDIME 2 SENSITIVE Sensitive     CIPROFLOXACIN 1 SENSITIVE Sensitive     GENTAMICIN 4 SENSITIVE Sensitive     IMIPENEM 2 SENSITIVE Sensitive     PIP/TAZO <=4 SENSITIVE Sensitive     * >=100,000 COLONIES/mL PSEUDOMONAS AERUGINOSA  Blood Culture (routine x 2)     Status: None (Preliminary result)   Collection Time: 02/02/20  1:17 AM   Specimen: BLOOD  Result Value Ref Range Status   Specimen Description   Final    BLOOD RIGHT  ANTECUBITAL Performed at [facility], [address], [place], [place] [address]    Special Requests   Final    BOTTLES DRAWN AEROBIC AND ANAEROBIC Blood Culture adequate volume Performed at [facility], [address] [place], [place] [address]    Culture   Final    NO GROWTH 4 DAYS Performed at [facility], [address], [place], [place] [address]    Report Status PENDING  Incomplete  Urine Culture     Status: None   Collection Time: 02/02/20  4:26 AM   Specimen: Urine, Cystoscope  Result Value Ref Range Status   Specimen Description   Final    CYSTOSCOPY LT RENAL PELCIS Performed at [facility], [address] [place], [place] [address]    Special Requests   Final    PATIENT ON FOLLOWING Clement Husbands, MAXIPIME Performed at [facility], [address], [place], [place] [address]    Culture   Final    NO GROWTH Performed at [facility], [address] [place], [place] [address]    Report Status 02/03/2020 FINAL  Final  SARS CORONAVIRUS 2 (TAT 6-24 HRS) Nasopharyngeal Nasopharyngeal Swab     Status: None   Collection Time: 02/05/20  6:45 PM   Specimen: Nasopharyngeal Swab  Result Value Ref Range Status   SARS Coronavirus 2 NEGATIVE NEGATIVE Final    Comment: (NOTE) SARS-CoV-2 target nucleic acids are NOT DETECTED.  The SARS-CoV-2 RNA is generally detectable in upper and lower respiratory specimens during the acute phase of infection. Negative results do not preclude SARS-CoV-2 infection, do not rule out co-infections with other pathogens, and should not be used as the sole basis for treatment or other patient management decisions. Negative results must be combined with clinical observations, patient history, and epidemiological information. The expected result is Negative.  Fact Sheet for Patients: HairSlick.no  Fact Sheet for Healthcare  Providers: quierodirigir.com  This test is not yet approved or cleared by the [place] FDA and  has been authorized for detection and/or diagnosis of SARS-CoV-2 by FDA under an Emergency Use Authorization (EUA). This EUA will remain  in effect (meaning this test can be used) for the duration of the COVID-19 declaration under Se ction 564(b)(1) of the Act, 21 U.S.C. section 360bbb-3(b)(1), unless the authorization is terminated or revoked sooner.  Performed at [facility], [address] [place], [place] [address]      Discharge Instructions:   Discharge Instructions    Call MD for:  persistant nausea and vomiting   Complete by: As directed    Call MD for:  severe uncontrolled pain   Complete by: As directed    Call MD for:  temperature >100.4   Complete by: As directed  Diet - low sodium heart healthy   Complete by: As directed    Dysphagia III diet   Discharge instructions   Complete by: As directed    Complete the course of antibiotics. Follow up with your primary care provider at the skilled nursing facility in 3-5 days. Follow up with [facility] as scheduled by the clinic   Increase activity slowly   Complete by: As directed    No wound care   Complete by: As directed      Allergies as of 02/06/2020      Reactions   Penicillin G Shortness Of Breath   Penicillins       Medication List    STOP taking these medications   cephALEXin 500 MG capsule Commonly known as: KEFLEX   doxycycline 100 MG tablet Commonly known as: ADOXA     TAKE these medications   acetaminophen 325 MG tablet Commonly known as: TYLENOL Take 650 mg by mouth every 4 (four) hours as needed (for pain or a temperature greater than 100 F).   alendronate-cholecalciferol 70-2800 MG-UNIT tablet Commonly known as: FOSAMAX PLUS D Take 1 tablet by mouth every 7 (seven) days. Take with a full glass of water on an empty stomach.   apixaban 5 MG Tabs  tablet Commonly known as: ELIQUIS Take 5 mg by mouth 2 (two) times daily.   bisacodyl 10 MG suppository Commonly known as: DULCOLAX Place 10 mg rectally daily as needed (for constipation- "if no BM by the 4th day").   Calcium Carbonate-Vitamin D 600-400 MG-UNIT tablet Take 1 tablet by mouth daily.   ciprofloxacin 500 MG tablet Commonly known as: Cipro Take 1 tablet (500 mg total) by mouth 2 (two) times daily for 3 days.   Cranberry 450 MG Tabs Take 450 tablets by mouth daily.   fluticasone-salmeterol 45-21 MCG/ACT inhaler Commonly known as: ADVAIR HFA Inhale 1 puff into the lungs 2 (two) times daily.   ipratropium 0.06 % nasal spray Commonly known as: ATROVENT Place 2 sprays into both nostrils in the morning and at bedtime.   levothyroxine 75 MCG tablet Commonly known as: SYNTHROID Take 75 mcg by mouth daily before breakfast.   magnesium hydroxide 400 MG/5ML suspension Commonly known as: MILK OF MAGNESIA Take 30 mLs by mouth daily as needed for mild constipation (if no BM in 3 days).   multivitamin with minerals tablet Take 1 tablet by mouth daily.   predniSONE 5 MG tablet Commonly known as: DELTASONE Take 5 mg by mouth daily with breakfast.   senna 8.6 MG Tabs tablet Commonly known as: SENOKOT Take 1 tablet by mouth daily.   simvastatin 10 MG tablet Commonly known as: ZOCOR Take 10 mg by mouth every other day.   tamsulosin 0.4 MG Caps capsule Commonly known as: FLOMAX Take 0.4 mg by mouth at bedtime.       Follow-up Information    Schedule an appointment as soon as possible for a visit with [facility].   Contact information: [address] [place] [address] [phone]               Time coordinating discharge: 39 minutes  Signed:  Ayse Mccartin  [facility] 02/06/2020, 10:06 AM

## 2020-02-06 NOTE — TOC Transition Note (Signed)
Transition of Care [facility]) - CM/SW Discharge Note   Patient Details  Name: Jaclyn Weaver MRN: [id number] Date of Birth: 02/04/1930  [facility]) CM/SW Contact:  Lanier Clam, RN Phone Number: 02/06/2020, 9:47 AM   Clinical Narrative: PTAR called.      Final next level of care: Memory Care Barriers to Discharge: No Barriers Identified   Patient Goals and CMS Choice Patient states their goals for this hospitalization and ongoing recovery are:: return back to [facility] Medicare.gov Compare Post Acute Care list provided to:: Patient Represenative (must comment) Choice offered to / list presented to : Adult Children  Discharge Placement              Patient chooses bed at: Other - please specify in the comment section below: [facility]) Patient to be transferred to facility by: PTAR Name of family member notified: Rosalita Chessman dtr [phone] Patient and family notified of of transfer: 02/06/20  Discharge Plan and Services   Discharge Planning Services: CM Consult Post Acute Care Choice: Nursing Home (Memory Care)                               Social Determinants of Health (SDOH) Interventions     Readmission Risk Interventions No flowsheet data found.

## 2020-02-07 ENCOUNTER — Other Ambulatory Visit: Payer: Self-pay

## 2020-02-07 ENCOUNTER — Encounter (HOSPITAL_COMMUNITY): Payer: Self-pay | Admitting: Urology

## 2020-02-07 LAB — CULTURE, BLOOD (ROUTINE X 2)
Culture: NO GROWTH
Culture: NO GROWTH
Special Requests: ADEQUATE

## 2020-02-07 NOTE — Progress Notes (Signed)
Received confirmation from Lurena Joiner at [facility] that instructions received for upcoming surgery 02-09-20.

## 2020-02-07 NOTE — Progress Notes (Addendum)
COVID Vaccine Completed: Date COVID Vaccine completed: COVID vaccine manufacturer: [employer]   PCP - Peri Maris, FNP Cardiologist -   Chest x-ray - 02-01-20 in Epic EKG - 02-01-20 in Epic Stress Test -  ECHO -  Cardiac Cath -  Pacemaker/ICD device last checked:  Sleep Study - n/a CPAP -   Fasting Blood Sugar - n/A Checks Blood Sugar _____ times a day  Blood Thinner Instructions:  Eliquis 5 mg.  To remain on per Dr. Berneice Heinrich Aspirin Instructions: Last Dose:  Anesthesia review:  COPD.  Recent hospitalization for sepsis.  Per patient's daughter, patient is unable to walk and is nonverbal due to vascular degeneration.  Pt resides in [facility].   Patient verbalized understanding of instructions that were given to them at the PAT appointment. Patient was also instructed that they will need to review over the PAT instructions again at home before surgery.

## 2020-02-09 ENCOUNTER — Ambulatory Visit (HOSPITAL_COMMUNITY)
Admission: RE | Admit: 2020-02-09 | Discharge: 2020-02-09 | Disposition: A | Discharge status: Skilled Nursing Facility | Payer: Medicare Other | Attending: Urology | Admitting: Urology

## 2020-02-09 ENCOUNTER — Encounter (HOSPITAL_COMMUNITY): Payer: Self-pay | Admitting: Urology

## 2020-02-09 ENCOUNTER — Encounter (HOSPITAL_COMMUNITY)
Admission: RE | Disposition: A | Discharge status: Skilled Nursing Facility | Payer: Self-pay | Source: Home / Self Care | Attending: Urology

## 2020-02-09 ENCOUNTER — Ambulatory Visit (HOSPITAL_COMMUNITY): Payer: Medicare Other | Admitting: Physician Assistant

## 2020-02-09 ENCOUNTER — Ambulatory Visit (HOSPITAL_COMMUNITY): Payer: Medicare Other

## 2020-02-09 DIAGNOSIS — Z7951 Long term (current) use of inhaled steroids: Secondary | ICD-10-CM | POA: Insufficient documentation

## 2020-02-09 DIAGNOSIS — Z20822 Contact with and (suspected) exposure to covid-19: Secondary | ICD-10-CM | POA: Diagnosis not present

## 2020-02-09 DIAGNOSIS — N201 Calculus of ureter: Secondary | ICD-10-CM | POA: Insufficient documentation

## 2020-02-09 DIAGNOSIS — Z7901 Long term (current) use of anticoagulants: Secondary | ICD-10-CM | POA: Diagnosis not present

## 2020-02-09 DIAGNOSIS — Z86718 Personal history of other venous thrombosis and embolism: Secondary | ICD-10-CM | POA: Insufficient documentation

## 2020-02-09 DIAGNOSIS — F0390 Unspecified dementia without behavioral disturbance: Secondary | ICD-10-CM | POA: Diagnosis not present

## 2020-02-09 DIAGNOSIS — Z7983 Long term (current) use of bisphosphonates: Secondary | ICD-10-CM | POA: Insufficient documentation

## 2020-02-09 DIAGNOSIS — Z79899 Other long term (current) drug therapy: Secondary | ICD-10-CM | POA: Diagnosis not present

## 2020-02-09 DIAGNOSIS — Z7952 Long term (current) use of systemic steroids: Secondary | ICD-10-CM | POA: Diagnosis not present

## 2020-02-09 DIAGNOSIS — Z79890 Hormone replacement therapy (postmenopausal): Secondary | ICD-10-CM | POA: Insufficient documentation

## 2020-02-09 HISTORY — PX: HOLMIUM LASER APPLICATION: SHX5852

## 2020-02-09 HISTORY — DX: Unspecified atherosclerosis: I70.90

## 2020-02-09 HISTORY — PX: CYSTOSCOPY WITH RETROGRADE PYELOGRAM, URETEROSCOPY AND STENT PLACEMENT: SHX5789

## 2020-02-09 LAB — SARS CORONAVIRUS 2 BY RT PCR (HOSPITAL ORDER, PERFORMED IN ~~LOC~~ HOSPITAL LAB): SARS Coronavirus 2: NEGATIVE

## 2020-02-09 SURGERY — CYSTOURETEROSCOPY, WITH RETROGRADE PYELOGRAM AND STENT INSERTION
Anesthesia: General

## 2020-02-09 MED ORDER — ORAL CARE MOUTH RINSE: 15.0000 mL | Freq: Once | OROMUCOSAL | Status: DC

## 2020-02-09 MED ORDER — LIDOCAINE 2% (20 MG/ML) 5 ML SYRINGE
INTRAMUSCULAR | Status: DC | PRN
  Administered 2020-02-09: 50 mg via INTRAVENOUS

## 2020-02-09 MED ORDER — DEXAMETHASONE SODIUM PHOSPHATE 10 MG/ML IJ SOLN
INTRAMUSCULAR | Status: AC
  Filled 2020-02-09: qty 1

## 2020-02-09 MED ORDER — ONDANSETRON HCL 4 MG/2ML IJ SOLN
INTRAMUSCULAR | Status: DC | PRN
  Administered 2020-02-09: 4 mg via INTRAVENOUS

## 2020-02-09 MED ORDER — CHLORHEXIDINE GLUCONATE 0.12 % MT SOLN: 15.0000 mL | Freq: Once | OROMUCOSAL | Status: DC

## 2020-02-09 MED ORDER — ROCURONIUM BROMIDE 10 MG/ML (PF) SYRINGE
PREFILLED_SYRINGE | INTRAVENOUS | Status: AC
  Filled 2020-02-09: qty 10

## 2020-02-09 MED ORDER — PROPOFOL 10 MG/ML IV BOLUS
INTRAVENOUS | Status: DC | PRN
  Administered 2020-02-09: 100 mg via INTRAVENOUS

## 2020-02-09 MED ORDER — ONDANSETRON HCL 4 MG/2ML IJ SOLN
INTRAMUSCULAR | Status: AC
  Filled 2020-02-09: qty 2

## 2020-02-09 MED ORDER — FENTANYL CITRATE (PF) 100 MCG/2ML IJ SOLN
INTRAMUSCULAR | Status: AC
  Filled 2020-02-09: qty 2

## 2020-02-09 MED ORDER — DEXAMETHASONE SODIUM PHOSPHATE 10 MG/ML IJ SOLN
INTRAMUSCULAR | Status: DC | PRN
  Administered 2020-02-09: 4 mg via INTRAVENOUS

## 2020-02-09 MED ORDER — IOHEXOL 300 MG/ML  SOLN
INTRAMUSCULAR | Status: DC | PRN
  Administered 2020-02-09: 20 mL

## 2020-02-09 MED ORDER — CIPROFLOXACIN HCL 500 MG PO TABS: 500.0000 mg | ORAL_TABLET | Freq: Two times a day (BID) | ORAL | 0 refills | Status: AC

## 2020-02-09 MED ORDER — PROPOFOL 10 MG/ML IV BOLUS
INTRAVENOUS | Status: AC
  Filled 2020-02-09: qty 20

## 2020-02-09 MED ORDER — SODIUM CHLORIDE 0.9 % IR SOLN
Status: DC | PRN
  Administered 2020-02-09 (×2): 1000 mL

## 2020-02-09 MED ORDER — FENTANYL CITRATE (PF) 100 MCG/2ML IJ SOLN: 25.0000 ug | INTRAMUSCULAR | Status: DC | PRN

## 2020-02-09 MED ORDER — CIPROFLOXACIN IN D5W 400 MG/200ML IV SOLN
400.0000 mg | INTRAVENOUS | Status: AC
  Administered 2020-02-09: 400 mg via INTRAVENOUS
  Filled 2020-02-09: qty 200

## 2020-02-09 MED ORDER — VANCOMYCIN HCL IN DEXTROSE 1-5 GM/200ML-% IV SOLN
1000.0000 mg | INTRAVENOUS | Status: AC
  Administered 2020-02-09: 1000 mg via INTRAVENOUS
  Filled 2020-02-09: qty 200

## 2020-02-09 MED ORDER — PHENYLEPHRINE 40 MCG/ML (10ML) SYRINGE FOR IV PUSH (FOR BLOOD PRESSURE SUPPORT)
PREFILLED_SYRINGE | INTRAVENOUS | Status: DC | PRN
  Administered 2020-02-09: 40 ug via INTRAVENOUS
  Administered 2020-02-09 (×2): 80 ug via INTRAVENOUS
  Administered 2020-02-09: 40 ug via INTRAVENOUS

## 2020-02-09 MED ORDER — FENTANYL CITRATE (PF) 100 MCG/2ML IJ SOLN
INTRAMUSCULAR | Status: DC | PRN
  Administered 2020-02-09 (×2): 25 ug via INTRAVENOUS
  Administered 2020-02-09: 50 ug via INTRAVENOUS

## 2020-02-09 MED ORDER — LACTATED RINGERS IV SOLN: INTRAVENOUS | Status: DC

## 2020-02-09 MED ORDER — ONDANSETRON HCL 4 MG/2ML IJ SOLN: 4.0000 mg | Freq: Once | INTRAMUSCULAR | Status: DC | PRN

## 2020-02-09 MED ORDER — PHENYLEPHRINE HCL (PRESSORS) 10 MG/ML IV SOLN
INTRAVENOUS | Status: AC
  Filled 2020-02-09: qty 2

## 2020-02-09 SURGICAL SUPPLY — 30 items
BAG URINE DRAIN 2000ML AR STRL (UROLOGICAL SUPPLIES) IMPLANT
BAG URO CATCHER STRL LF (MISCELLANEOUS) ×3 IMPLANT
BASKET LASER NITINOL 1.9FR (BASKET) ×3 IMPLANT
CATH INTERMIT  6FR 70CM (CATHETERS) ×3 IMPLANT
CLOTH BEACON ORANGE TIMEOUT ST (SAFETY) ×3 IMPLANT
ELECT REM PT RETURN 15FT ADLT (MISCELLANEOUS) ×3 IMPLANT
EVACUATOR MICROVAS BLADDER (UROLOGICAL SUPPLIES) IMPLANT
EXTRACTOR STONE 1.7FRX115CM (UROLOGICAL SUPPLIES) IMPLANT
GLOVE SURG ENC TEXT LTX SZ7.5 (GLOVE) ×3 IMPLANT
GOWN STRL REUS W/TWL LRG LVL3 (GOWN DISPOSABLE) ×3 IMPLANT
GUIDEWIRE ANG ZIPWIRE 038X150 (WIRE) ×3 IMPLANT
GUIDEWIRE STR DUAL SENSOR (WIRE) ×3 IMPLANT
KIT TURNOVER KIT A (KITS) IMPLANT
LASER FIB FLEXIVA PULSE ID 365 (Laser) IMPLANT
LASER FIB FLEXIVA PULSE ID 550 (Laser) IMPLANT
LASER FIB FLEXIVA PULSE ID 910 (Laser) IMPLANT
LOOP CUT BIPOLAR 24F LRG (ELECTROSURGICAL) IMPLANT
MANIFOLD NEPTUNE II (INSTRUMENTS) ×3 IMPLANT
PACK CYSTO (CUSTOM PROCEDURE TRAY) ×3 IMPLANT
PENCIL SMOKE EVACUATOR (MISCELLANEOUS) IMPLANT
SHEATH URETERAL 12FRX28CM (UROLOGICAL SUPPLIES) ×3 IMPLANT
SHEATH URETERAL 12FRX35CM (MISCELLANEOUS) IMPLANT
STENT POLARIS 5FRX22 (STENTS) ×3 IMPLANT
SYR TOOMEY IRRIG 70ML (MISCELLANEOUS)
SYRINGE TOOMEY IRRIG 70ML (MISCELLANEOUS) IMPLANT
TRACTIP FLEXIVA PULS ID 200XHI (Laser) IMPLANT
TRACTIP FLEXIVA PULSE ID 200 (Laser)
TUBE FEEDING 8FR 16IN STR KANG (MISCELLANEOUS) ×3 IMPLANT
TUBING CONNECTING 10 (TUBING) ×3 IMPLANT
TUBING UROLOGY SET (TUBING) ×3 IMPLANT

## 2020-02-09 NOTE — Progress Notes (Signed)
PTAR en route w/pt for return transport back to [facility]. Pt's VS stable prior to D/C from PACU, and patient in no apparent distress. RN called verbal report to [facility]. Notified the staff pt is due to void post-op, and PTAR unable to transport patient's personal wheelchair with the patient. We notified pt's daughter that the wheelchair needs to be picked up and it is securely labeled w/patient stickers in PACU for p/u at earliest convenience. Note left in PACU charge nurse notebook regarding wheelchair, that daughter is aware it needs to be picked up, along with daughter's contact information if needed.

## 2020-02-09 NOTE — Brief Op Note (Signed)
02/09/2020  5:33 PM  PATIENT:  Jaclyn Weaver  87 y.o. female  PRE-OPERATIVE DIAGNOSIS:  LEFT URETERAL STONE, POSSIBLE BLADDER TUMOR  POST-OPERATIVE DIAGNOSIS:  LEFT URETERAL STONE, POSSIBLE BLADDER TUMOR  PROCEDURE:  Procedure(s) with comments: CYSTOSCOPY WITH RETROGRADE PYELOGRAM, URETEROSCOPY AND STENT REPLACEMENT (Left) - 75 MINS HOLMIUM LASER APPLICATION (Left)  SURGEON:  Surgeon(s) and Role:    Sebastian Ache, MD - Primary  PHYSICIAN ASSISTANT:   ASSISTANTS: none   ANESTHESIA:   general  EBL:  minimal   BLOOD ADMINISTERED:none  DRAINS: none   LOCAL MEDICATIONS USED:  NONE  SPECIMEN:  Source of Specimen:  left ureteral stone fragments  DISPOSITION OF SPECIMEN:  [facility] for compositional analysis  COUNTS:  YES  TOURNIQUET:  * No tourniquets in log *  DICTATION: .Other Dictation: Dictation Number [id number]  PLAN OF CARE: Discharge to home after PACU  PATIENT DISPOSITION:  PACU - hemodynamically stable.   Delay start of Pharmacological VTE agent (>24hrs) due to surgical blood loss or risk of bleeding: no

## 2020-02-09 NOTE — H&P (Signed)
Jaclyn Weaver is an 87 y.o. female.    Chief Complaint: Pre-Op LEFT Ureteroscopic stone manipulatoin.   HPI:   1 - LEFT Ureteral Stone - 38mm left UVJ sotne by ER CT 01/2020. Developed systemic infectious parameters and stented. UCX psudomonas sens cipro, gent, most recently and treated with cefipeme bridged to cipro. Stone solitary.  PMH sit for advanced dementia / nov verbal / lives [facility], DVT/Eliquus.  Today "Jaclyn Weaver" is seen to proceed with LEFT ureteroscopy to stone free. No interval fevers. C19 screen negative.    Past Medical History:  Diagnosis Date  . Constipation    from facility paperwork  . COPD (chronic obstructive pulmonary disease) (HCC)   . Dementia (HCC)    from facility paperwork  . Dermatitis    from facility paperwork  . Hyperlipidemia    from facility paperwork  . Hypertension   . Hypothyroidism   . Osteoporosis    from facility paperwork  . Urinary incontinence    from facility paperwork  . 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 CYSTOSCOPY W/ URETERAL STENT PLACEMENT Left 02/02/2020   Procedure: CYSTOSCOPY WITH RETROGRADE PYELOGRAM/URETERAL STENT PLACEMENT;  Surgeon: Jannifer Hick, MD;  Location: [facility];  Service: Urology;  Laterality: Left;  . NO PAST SURGERIES      History reviewed. No pertinent family history. Social History:  reports that she has never smoked. She has never used smokeless tobacco. She reports that she does not drink alcohol and does not use drugs.  Allergies:  Allergies  Allergen Reactions  . Penicillin G Shortness Of Breath  . Penicillins     Medications Prior to Admission  Medication Sig Dispense Refill  . acetaminophen (TYLENOL) 325 MG tablet Take 650 mg by mouth every 4 (four) hours as needed (for pain or a temperature greater than 100 F).    Marland Kitchen alendronate-cholecalciferol (FOSAMAX PLUS D) 70-2800 MG-UNIT tablet Take 1 tablet by mouth every  7 (seven) days. Take with a full glass of water on an empty stomach.    Marland Kitchen apixaban (ELIQUIS) 5 MG TABS tablet Take 5 mg by mouth 2 (two) times daily.    . bisacodyl (DULCOLAX) 10 MG suppository Place 10 mg rectally daily as needed (for constipation- "if no BM by the 4th day").    . Calcium Carbonate-Vitamin D 600-400 MG-UNIT tablet Take 1 tablet by mouth daily.    . ciprofloxacin (CIPRO) 500 MG tablet Take 1 tablet (500 mg total) by mouth 2 (two) times daily for 3 days. 6 tablet 0  . Cranberry 450 MG TABS Take 450 tablets by mouth daily.    . fluticasone-salmeterol (ADVAIR HFA) 45-21 MCG/ACT inhaler Inhale 1 puff into the lungs 2 (two) times daily.    Marland Kitchen ipratropium (ATROVENT) 0.06 % nasal spray Place 2 sprays into both nostrils in the morning and at bedtime.    Marland Kitchen levothyroxine (SYNTHROID) 75 MCG tablet Take 75 mcg by mouth daily before breakfast.    . Multiple Vitamins-Minerals (MULTIVITAMIN WITH MINERALS) tablet Take 1 tablet by mouth daily.    . predniSONE (DELTASONE) 5 MG tablet Take 5 mg by mouth daily with breakfast.    . senna (SENOKOT) 8.6 MG TABS tablet Take 1 tablet by mouth daily.    . simvastatin (ZOCOR) 10 MG tablet Take 10 mg by mouth every other day.    . tamsulosin (FLOMAX) 0.4 MG CAPS capsule Take 0.4 mg  by mouth at bedtime.    . magnesium hydroxide (MILK OF MAGNESIA) 400 MG/5ML suspension Take 30 mLs by mouth daily as needed for mild constipation (if no BM in 3 days).      No results found for this or any previous visit (from the past 48 hour(s)). No results found.  Review of Systems  Unable to perform ROS: Dementia    Blood pressure 132/71, pulse 72, temperature 99.1 F (37.3 C), temperature source Oral, resp. rate 16, SpO2 97 %. Physical Exam Vitals reviewed.  Constitutional:      Comments: Non-verbal. Cooperative. Daughter who is POA at bedside.   HENT:     Head: Normocephalic.     Nose: Nose normal.  Cardiovascular:     Rate and Rhythm: Normal rate.     Pulses:  Normal pulses.  Abdominal:     General: Abdomen is flat.  Genitourinary:    Comments: No CVAT at present.  Musculoskeletal:     Comments: Non-ambulatory.   Skin:    General: Skin is warm.      Assessment/Plan  Proceed as planned with cysto, LEFT retrogarde, ureteroscopy, laser, possible stent exchange. Risks, benefits, alternatives, expected peri-op course discussed with pt and daughter previously and reiterated today. WE disucssed htat her poor functional baseline increases risk of ALL peri-op complications (includinig sepsis and mortality) by at least 20 Fold.   Sebastian Ache, MD 02/09/2020, 1:21 PM

## 2020-02-09 NOTE — Anesthesia Preprocedure Evaluation (Signed)
Anesthesia Evaluation  Patient identified by MRN, date of birth, ID band Patient awake and Patient confused    Reviewed: Allergy & Precautions, NPO status , Patient's Chart, lab work & pertinent test results  Airway Mallampati: II  TM Distance: >3 FB Neck ROM: Full    Dental no notable dental hx.    Pulmonary COPD,  COPD inhaler,    Pulmonary exam normal        Cardiovascular hypertension, Pt. on medications Normal cardiovascular exam Rhythm:Regular Rate:Normal  EKG 02/06/20 Sinus tachycardia, prolonged QTc   Neuro/Psych PSYCHIATRIC DISORDERS Dementia Patient is non verbalnegative neurological ROS     GI/Hepatic negative GI ROS, Neg liver ROS,   Endo/Other  Hypothyroidism Hyperlipidemia Osteoporosis  Renal/GU Renal diseaseLeft ureteral obstruction with hydronephrosis Possible bladder tumor  negative genitourinary   Musculoskeletal   Abdominal   Peds  Hematology negative hematology ROS (+)   Anesthesia Other Findings   Reproductive/Obstetrics                             Anesthesia Physical Anesthesia Plan  ASA: III  Anesthesia Plan: General   Post-op Pain Management:    Induction: Intravenous  PONV Risk Score and Plan: 4 or greater and Treatment may vary due to age or medical condition and Ondansetron  Airway Management Planned: LMA  Additional Equipment:   Intra-op Plan:   Post-operative Plan: Extubation in OR  Informed Consent: I have reviewed the patients History and Physical, chart, labs and discussed the procedure including the risks, benefits and alternatives for the proposed anesthesia with the patient or authorized representative who has indicated his/her understanding and acceptance.     Dental advisory given  Plan Discussed with: CRNA and Anesthesiologist  Anesthesia Plan Comments:         Anesthesia Quick Evaluation

## 2020-02-09 NOTE — Anesthesia Postprocedure Evaluation (Addendum)
Anesthesia Post Note  Patient: [name]  Procedure(s) Performed: CYSTOSCOPY WITH RETROGRADE PYELOGRAM, URETEROSCOPY AND STENT REPLACEMENT (Left ) HOLMIUM LASER APPLICATION (Left )     Patient location during evaluation: PACU Anesthesia Type: General Level of consciousness: awake Pain management: pain level controlled Vital Signs Assessment: post-procedure vital signs reviewed and stable Respiratory status: spontaneous breathing, nonlabored ventilation and respiratory function stable Cardiovascular status: blood pressure returned to baseline and stable Postop Assessment: no apparent nausea or vomiting Anesthetic complications: no   No complications documented.  Last Vitals:  Vitals:   02/09/20 1815 02/09/20 1830  BP: (!) 149/84 (!) 173/82  Pulse: 82 70  Resp: 16 16  Temp:  36.6 C  SpO2: 97% 95%    Last Pain:  Vitals:   02/09/20 1259  TempSrc: Oral                 Jaclyn Weaver A.

## 2020-02-09 NOTE — Transfer of Care (Signed)
Immediate Anesthesia Transfer of Care Note  Patient: Jaclyn Weaver  Procedure(s) Performed: CYSTOSCOPY WITH RETROGRADE PYELOGRAM, URETEROSCOPY AND STENT REPLACEMENT (Left ) HOLMIUM LASER APPLICATION (Left )  Patient Location: PACU  Anesthesia Type:General  Level of Consciousness: drowsy and patient cooperative  Airway & Oxygen Therapy: Patient Spontanous Breathing and Patient connected to face mask oxygen  Post-op Assessment: Report given to RN and Post -op Vital signs reviewed and stable  Post vital signs: Reviewed and stable  Last Vitals:  Vitals Value Taken Time  BP 145/72 02/09/20 1743  Temp    Pulse 66 02/09/20 1745  Resp 13 02/09/20 1745  SpO2 100 % 02/09/20 1745  Vitals shown include unvalidated device data.  Last Pain:  Vitals:   02/09/20 1259  TempSrc: Oral         Complications: No complications documented.

## 2020-02-09 NOTE — Discharge Instructions (Signed)
1 - You may have urinary urgency (bladder spasms) and bloody urine on / off with stent in place. This is normal. ° °2 - Call MD or go to ER for fever >102, severe pain / nausea / vomiting not relieved by medications, or acute change in medical status ° °

## 2020-02-09 NOTE — Anesthesia Procedure Notes (Signed)
Procedure Name: LMA Insertion Date/Time: 02/09/2020 4:40 PM Performed by: Yolonda Kida, CRNA Pre-anesthesia Checklist: Patient identified, Emergency Drugs available, Suction available and Patient being monitored Patient Re-evaluated:Patient Re-evaluated prior to induction Oxygen Delivery Method: Circle system utilized Preoxygenation: Pre-oxygenation with 100% oxygen Induction Type: IV induction Ventilation: Mask ventilation without difficulty LMA: LMA inserted LMA Size: 3.0 Number of attempts: 1 Placement Confirmation: positive ETCO2 and breath sounds checked- equal and bilateral Tube secured with: Tape Dental Injury: Teeth and Oropharynx as per pre-operative assessment

## 2020-02-09 NOTE — Progress Notes (Signed)
Patient's daughter informed of transport back to [facility] via [facility] transport asked that we call when they arrive. Patient is out of recovery in Phase 2 at present waiting for [facility] ambulance transport.

## 2020-02-09 NOTE — Progress Notes (Signed)
Patient's daughter picked up wheelchair from PACU.

## 2020-02-10 ENCOUNTER — Encounter (HOSPITAL_COMMUNITY): Payer: Self-pay | Admitting: Urology

## 2020-02-12 NOTE — Op Note (Signed)
NAMEILENA, DIECKMAN TTT MEDICAL RECORD [id number] ACCOUNT [id number] DATE OF BIRTH:[birth year] FACILITY: [facility] LOCATION: [facility] PHYSICIAN:Zerick Prevette, MD  OPERATIVE REPORT  DATE OF PROCEDURE:  02/09/2020  PREOPERATIVE DIAGNOSIS:  Left ureteral stone, rule out bladder cancer.  POSTOPERATIVE DIAGNOSIS:  Left ureteral stone, impacted.  PROCEDURE: 1.  Cystoscopy, left retrograde pyelogram, interpretation. 2.  Left ureteroscopy with laser lithotripsy. 3.  Exchange left ureteral stent, 5 x 22 Polaris, no tether.  ESTIMATED BLOOD LOSS:  Nil.  MEDICATIONS:  None.  SPECIMENS:  Left ureteral stone fragments for analysis.  FINDINGS: 1.  Completely impacted left proximal ureteral stone. 2.  Complete resolution of all accessible stone fragments larger than 1/3 mm following laser lithotripsy and basket extraction. 3.  Successful replacement of left ureteral stent, proximal end in renal pelvis, distal end in urinary bladder.  INDICATIONS:  The patient is an unfortunate 87 year old woman with history of severe dementia who lives in a skilled nursing facility.  She was found to have a left ureteral stone recently on evaluation of abdominal pain.  She had some bacteriuria as  well as a questionable bladder lesion.  Originally, she was planned to have left ureteroscopy with treatment of her bacteriuria; however, she developed a frank sepsis picture, admitted to the hospital and had renal decompression with stenting alone in  the acute setting and found to have pseudomonas in the urine different than her prior enterococcus.  She was subsequently discharged on appropriate antimicrobial therapy with plan for definitive stone management in elective setting.  She presents for  this today.  Informed consent was obtained and placed in medical record.  PROCEDURE IN DETAIL:  The patient being identified as patient, the procedure being left ureteroscopic stone manipulation was confirmed.  Procedure  timeout was performed.  Intravenous antibiotics administered, general LMA anesthesia induced.  The patient  was placed into a low lithotomy position.  Sterile field was created, prepped and draped the patient's vagina, introitus and proximal thighs using iodine.  Cystourethroscopy was performed using 21-French rigid cystoscope with offset lens.  Inspection of  the bladder revealed no diverticula, calcifications, no papillary lesions.  The right ureteral orifice was unremarkable.  There was no evidence of intraluminal or papillary lesions whatsoever.  The distal end of left ureteral stent was seen and grasped,  brought to the level of the urethral meatus.  A 0.03 ZIPwire was advanced the level of upper pole and  the stent was exchanged for open-ended catheter and left retrograde pyelogram was obtained.  Left retrograde pyelogram demonstrated a single left ureter, single system left kidney.  There was a filling defect in proximal ureter consistent with known stone.  ZIPwire was once again advanced, set aside as a safety wire.  An 8-French feeding tube  was placed in the urinary bladder for pressure release, and semirigid ureteroscopy of the distal left ureter alongside a separate sensor working wire.  No acute abnormalities were found.  Next, a semirigid scope was exchanged for a 12/14 short length  ureteral access sheath to the level of the proximal third ureter using continuous fluoroscopic guidance and taking exquisite care not to pass the sheath further than the previously visualized portion.  Next, the proximal ureter was inspected using  flexible digital ureteroscope.  Inspection of the proximal ureter revealed a completely impacted left proximal ureteral stone with major mucosal edema in the area.  Inspection of the remainder of the proximal ureter and left kidney including all calices  x3 revealed no additional stones.  Stones  were too large for simple basketing.  As such, holmium laser energy applied  using setting of 0.2 joules and 20 Hz, and fragmented into approximately 4 smaller pieces that were then amenable to basketing.  The  fragments removed and set aside for composition analysis.  Following stone removal, there was still significant mucosal edema as again the stone was completely impacted and it was felt that replacement of the stent with a nontethered stent would be  safest.  Access sheath was removed under continuous vision.  No significant mucosal abnormalities were found, and a new 5 x 22 Polaris-type stent was placed over remaining safety wire using fluoroscopic guidance.  Good proximal and distal planes were  noted.  The procedure was terminated.  The patient tolerated the procedure well.  No immediate perioperative complications.  The patient was taken to postanesthesia care unit in stable condition.  Plan for discharge back to skilled nursing facility and then removal of her stent in the outpatient setting.  HN/NUANCE  D:02/09/2020 T:02/10/2020 JOB:[id number]

## 2020-09-06 ENCOUNTER — Emergency Department (HOSPITAL_COMMUNITY): Payer: Medicare Other

## 2020-09-06 ENCOUNTER — Inpatient Hospital Stay (HOSPITAL_COMMUNITY)
Admission: EM | Admit: 2020-09-06 | Discharge: 2020-09-11 | DRG: 177 | Disposition: A | Discharge status: Nursing Home (Includes ICF) | Payer: Medicare Other | Source: Skilled Nursing Facility | Attending: Internal Medicine | Admitting: Internal Medicine

## 2020-09-06 ENCOUNTER — Encounter (HOSPITAL_COMMUNITY): Payer: Self-pay

## 2020-09-06 ENCOUNTER — Other Ambulatory Visit: Payer: Self-pay

## 2020-09-06 DIAGNOSIS — Z993 Dependence on wheelchair: Secondary | ICD-10-CM

## 2020-09-06 DIAGNOSIS — Z7901 Long term (current) use of anticoagulants: Secondary | ICD-10-CM

## 2020-09-06 DIAGNOSIS — U071 COVID-19: Secondary | ICD-10-CM | POA: Diagnosis not present

## 2020-09-06 DIAGNOSIS — Z66 Do not resuscitate: Secondary | ICD-10-CM | POA: Diagnosis present

## 2020-09-06 DIAGNOSIS — Z880 Allergy status to penicillin: Secondary | ICD-10-CM

## 2020-09-06 DIAGNOSIS — G9341 Metabolic encephalopathy: Secondary | ICD-10-CM | POA: Diagnosis present

## 2020-09-06 DIAGNOSIS — Z86718 Personal history of other venous thrombosis and embolism: Secondary | ICD-10-CM

## 2020-09-06 DIAGNOSIS — N390 Urinary tract infection, site not specified: Secondary | ICD-10-CM | POA: Diagnosis present

## 2020-09-06 DIAGNOSIS — E785 Hyperlipidemia, unspecified: Secondary | ICD-10-CM | POA: Diagnosis present

## 2020-09-06 DIAGNOSIS — K5900 Constipation, unspecified: Secondary | ICD-10-CM | POA: Diagnosis present

## 2020-09-06 DIAGNOSIS — R32 Unspecified urinary incontinence: Secondary | ICD-10-CM | POA: Diagnosis present

## 2020-09-06 DIAGNOSIS — Z960 Presence of urogenital implants: Secondary | ICD-10-CM | POA: Diagnosis present

## 2020-09-06 DIAGNOSIS — D7589 Other specified diseases of blood and blood-forming organs: Secondary | ICD-10-CM | POA: Diagnosis present

## 2020-09-06 DIAGNOSIS — Z7984 Long term (current) use of oral hypoglycemic drugs: Secondary | ICD-10-CM

## 2020-09-06 DIAGNOSIS — R1310 Dysphagia, unspecified: Secondary | ICD-10-CM | POA: Diagnosis present

## 2020-09-06 DIAGNOSIS — Z23 Encounter for immunization: Secondary | ICD-10-CM

## 2020-09-06 DIAGNOSIS — I10 Essential (primary) hypertension: Secondary | ICD-10-CM | POA: Diagnosis present

## 2020-09-06 DIAGNOSIS — Z7983 Long term (current) use of bisphosphonates: Secondary | ICD-10-CM

## 2020-09-06 DIAGNOSIS — F0390 Unspecified dementia without behavioral disturbance: Secondary | ICD-10-CM | POA: Diagnosis present

## 2020-09-06 DIAGNOSIS — Z7952 Long term (current) use of systemic steroids: Secondary | ICD-10-CM

## 2020-09-06 DIAGNOSIS — Z22322 Carrier or suspected carrier of Methicillin resistant Staphylococcus aureus: Secondary | ICD-10-CM

## 2020-09-06 DIAGNOSIS — B965 Pseudomonas (aeruginosa) (mallei) (pseudomallei) as the cause of diseases classified elsewhere: Secondary | ICD-10-CM | POA: Diagnosis present

## 2020-09-06 DIAGNOSIS — M810 Age-related osteoporosis without current pathological fracture: Secondary | ICD-10-CM | POA: Diagnosis present

## 2020-09-06 DIAGNOSIS — E039 Hypothyroidism, unspecified: Secondary | ICD-10-CM | POA: Diagnosis present

## 2020-09-06 DIAGNOSIS — K449 Diaphragmatic hernia without obstruction or gangrene: Secondary | ICD-10-CM | POA: Diagnosis present

## 2020-09-06 DIAGNOSIS — Z87440 Personal history of urinary (tract) infections: Secondary | ICD-10-CM

## 2020-09-06 DIAGNOSIS — Z7951 Long term (current) use of inhaled steroids: Secondary | ICD-10-CM

## 2020-09-06 DIAGNOSIS — Z79899 Other long term (current) drug therapy: Secondary | ICD-10-CM

## 2020-09-06 DIAGNOSIS — F0150 Vascular dementia without behavioral disturbance: Secondary | ICD-10-CM | POA: Diagnosis present

## 2020-09-06 DIAGNOSIS — N3000 Acute cystitis without hematuria: Secondary | ICD-10-CM

## 2020-09-06 DIAGNOSIS — R5381 Other malaise: Secondary | ICD-10-CM | POA: Diagnosis present

## 2020-09-06 DIAGNOSIS — D751 Secondary polycythemia: Secondary | ICD-10-CM | POA: Diagnosis present

## 2020-09-06 DIAGNOSIS — G9340 Encephalopathy, unspecified: Secondary | ICD-10-CM | POA: Diagnosis present

## 2020-09-06 DIAGNOSIS — J449 Chronic obstructive pulmonary disease, unspecified: Secondary | ICD-10-CM | POA: Diagnosis present

## 2020-09-06 LAB — CBC WITH DIFFERENTIAL/PLATELET
Abs Immature Granulocytes: 0.03 10*3/uL (ref 0.00–0.07)
Basophils Absolute: 0.1 10*3/uL (ref 0.0–0.1)
Basophils Relative: 1 %
Eosinophils Absolute: 0 10*3/uL (ref 0.0–0.5)
Eosinophils Relative: 0 %
HCT: 47.6 % — ABNORMAL HIGH (ref 36.0–46.0)
Hemoglobin: 15.6 g/dL — ABNORMAL HIGH (ref 12.0–15.0)
Immature Granulocytes: 0 %
Lymphocytes Relative: 13 %
Lymphs Abs: 1.1 10*3/uL (ref 0.7–4.0)
MCH: 33.7 pg (ref 26.0–34.0)
MCHC: 32.8 g/dL (ref 30.0–36.0)
MCV: 102.8 fL — ABNORMAL HIGH (ref 80.0–100.0)
Monocytes Absolute: 0.9 10*3/uL (ref 0.1–1.0)
Monocytes Relative: 11 %
Neutro Abs: 6.2 10*3/uL (ref 1.7–7.7)
Neutrophils Relative %: 75 %
Platelets: 175 10*3/uL (ref 150–400)
RBC: 4.63 MIL/uL (ref 3.87–5.11)
RDW: 13.3 % (ref 11.5–15.5)
WBC: 8.4 10*3/uL (ref 4.0–10.5)
nRBC: 0 % (ref 0.0–0.2)

## 2020-09-06 LAB — APTT: aPTT: 29 seconds (ref 24–36)

## 2020-09-06 LAB — COMPREHENSIVE METABOLIC PANEL
ALT: 14 U/L (ref 0–44)
AST: 15 U/L (ref 15–41)
Albumin: 3.5 g/dL (ref 3.5–5.0)
Alkaline Phosphatase: 46 U/L (ref 38–126)
Anion gap: 10 (ref 5–15)
BUN: 22 mg/dL (ref 8–23)
CO2: 24 mmol/L (ref 22–32)
Calcium: 9.4 mg/dL (ref 8.9–10.3)
Chloride: 106 mmol/L (ref 98–111)
Creatinine, Ser: 0.75 mg/dL (ref 0.44–1.00)
GFR, Estimated: >60 mL/min (ref >60)
Glucose, Bld: 106 mg/dL — ABNORMAL HIGH (ref 70–99)
Potassium: 4.1 mmol/L (ref 3.5–5.1)
Sodium: 140 mmol/L (ref 135–145)
Total Bilirubin: 0.6 mg/dL (ref 0.3–1.2)
Total Protein: 6.6 g/dL (ref 6.5–8.1)

## 2020-09-06 LAB — URINALYSIS, ROUTINE W REFLEX MICROSCOPIC
Bilirubin Urine: NEGATIVE
Glucose, UA: NEGATIVE mg/dL
Hgb urine dipstick: NEGATIVE
Ketones, ur: 20 mg/dL — AB
Nitrite: NEGATIVE
Protein, ur: 30 mg/dL — AB
Specific Gravity, Urine: 1.02 (ref 1.005–1.030)
WBC, UA: >50 WBC/hpf — ABNORMAL HIGH (ref 0–5)
pH: 6 (ref 5.0–8.0)

## 2020-09-06 LAB — LACTIC ACID, PLASMA: Lactic Acid, Venous: 1.1 mmol/L (ref 0.5–1.9)

## 2020-09-06 LAB — I-STAT CHEM 8, ED
BUN: 22 mg/dL (ref 8–23)
Calcium, Ion: 1.11 mmol/L — ABNORMAL LOW (ref 1.15–1.40)
Chloride: 106 mmol/L (ref 98–111)
Creatinine, Ser: 0.7 mg/dL (ref 0.44–1.00)
Glucose, Bld: 106 mg/dL — ABNORMAL HIGH (ref 70–99)
HCT: 37 % (ref 36.0–46.0)
Hemoglobin: 12.6 g/dL (ref 12.0–15.0)
Potassium: 4.2 mmol/L (ref 3.5–5.1)
Sodium: 138 mmol/L (ref 135–145)
TCO2: 26 mmol/L (ref 22–32)

## 2020-09-06 LAB — PROTIME-INR
INR: 1.1 (ref 0.8–1.2)
Prothrombin Time: 14.3 seconds (ref 11.4–15.2)

## 2020-09-06 MED ORDER — IOHEXOL 350 MG/ML SOLN
80.0000 mL | Freq: Once | INTRAVENOUS | Status: AC | PRN
  Administered 2020-09-06: 80 mL via INTRAVENOUS

## 2020-09-06 MED ORDER — LACTATED RINGERS IV SOLN: INTRAVENOUS | Status: DC

## 2020-09-06 MED ORDER — LACTATED RINGERS IV BOLUS (SEPSIS)
1000.0000 mL | Freq: Once | INTRAVENOUS | Status: AC
  Administered 2020-09-06: 1000 mL via INTRAVENOUS

## 2020-09-06 MED ORDER — METRONIDAZOLE 500 MG/100ML IV SOLN
500.0000 mg | Freq: Once | INTRAVENOUS | Status: DC
  Filled 2020-09-06: qty 100

## 2020-09-06 MED ORDER — SODIUM CHLORIDE 0.9 % IV SOLN
2.0000 g | Freq: Once | INTRAVENOUS | Status: AC
  Administered 2020-09-06: 2 g via INTRAVENOUS
  Filled 2020-09-06: qty 20

## 2020-09-06 MED ORDER — ACETAMINOPHEN 650 MG RE SUPP
650.0000 mg | Freq: Once | RECTAL | Status: AC
  Administered 2020-09-06: 650 mg via RECTAL
  Filled 2020-09-06: qty 1

## 2020-09-06 NOTE — Sepsis Progress Note (Signed)
Following for sepsis monitoring ?

## 2020-09-06 NOTE — ED Triage Notes (Signed)
Patient is here from [facility]. Staff reports patient has a fever. They gave her tylenol. They noticed she has not been as talkative she does have a baseline of dementia. She has a abdominal mass she was suppose to get a ultrasound done but they did not come to the facility today to do it. She had a abnormally large bowel movement yesterday.  Bp: 132/68 Hr:90 resp:30 Spo2:92% ems added 2L   CBG: 122

## 2020-09-06 NOTE — ED Provider Notes (Signed)
[facility]-EMERGENCY DEPT Provider Note   CSN: [id number] Arrival date & time: 09/06/20  2033     History No chief complaint on file.   Jaclyn Weaver is a 87 y.o. female hx of dementia, hypertension, hyperlipidemia here presenting with fever and constipation and altered mental status.  Patient has been constipated for the last several days.  Patient was noted to be febrile at the facility and appears to have abdominal distention.  She is supposed to have an x-ray done today but was never performed.  Since she is more altered than usual, EMS was called.  EMS noted low-grade temp 100.9.  Patient is unable to give history as she is demented.  Daughter was concerned that she may have abdominal mass but she had a CT abdomen pelvis done several months ago that did not show a mass.  The history is provided by the EMS personnel and a relative.  Level V caveat- dementia     Past Medical History:  Diagnosis Date   Constipation    from facility paperwork   COPD (chronic obstructive pulmonary disease) (HCC)    Dementia (HCC)    from facility paperwork   Dermatitis    from facility paperwork   Hyperlipidemia    from facility paperwork   Hypertension    Hypothyroidism    Osteoporosis    from facility paperwork   Urinary incontinence    from facility paperwork   Vascular degeneration    Pt is nonverbal due to this    Patient Active Problem List   Diagnosis Date Noted   Swelling of forearm 02/05/2020   Complicated UTI (urinary tract infection) 02/02/2020   Ureteral stone with hydronephrosis 02/02/2020   Leukocytosis 02/02/2020   Dementia (HCC) 02/02/2020   COPD (chronic obstructive pulmonary disease) (HCC) 02/02/2020    Past Surgical History:  Procedure Laterality Date   CYSTOSCOPY W/ URETERAL STENT PLACEMENT Left 02/02/2020   Procedure: CYSTOSCOPY WITH RETROGRADE PYELOGRAM/URETERAL STENT PLACEMENT;  Surgeon: Jannifer Hick, MD;  Location: [facility];  Service:  Urology;  Laterality: Left;   CYSTOSCOPY WITH RETROGRADE PYELOGRAM, URETEROSCOPY AND STENT PLACEMENT Left 02/09/2020   Procedure: CYSTOSCOPY WITH RETROGRADE PYELOGRAM, URETEROSCOPY AND STENT REPLACEMENT;  Surgeon: Sebastian Ache, MD;  Location: [facility];  Service: Urology;  Laterality: Left;  75 MINS   HOLMIUM LASER APPLICATION Left 02/09/2020   Procedure: HOLMIUM LASER APPLICATION;  Surgeon: Sebastian Ache, MD;  Location: [facility];  Service: Urology;  Laterality: Left;   NO PAST SURGERIES       OB History   No obstetric history on file.     No family history on file.  Social History   Tobacco Use   Smoking status: Never   Smokeless tobacco: Never  Vaping Use   Vaping Use: Never used  Substance Use Topics   Alcohol use: Never   Drug use: Never    Home Medications Prior to Admission medications   Medication Sig Start Date End Date Taking? Authorizing Provider  acetaminophen (TYLENOL) 325 MG tablet Take 650 mg by mouth every 4 (four) hours as needed (for pain or a temperature greater than 100 F).    [provider]  alendronate-cholecalciferol (FOSAMAX PLUS D) 70-2800 MG-UNIT tablet Take 1 tablet by mouth every 7 (seven) days. Take with a full glass of water on an empty stomach.    [provider]  apixaban (ELIQUIS) 5 MG TABS tablet Take 5 mg by mouth 2 (two) times daily.    [provider]  bisacodyl (DULCOLAX) 10 MG suppository Place 10 mg rectally daily as needed (for constipation- "if no BM by the 4th day").    [provider]  Calcium Carbonate-Vitamin D 600-400 MG-UNIT tablet Take 1 tablet by mouth daily.    [provider]  Cranberry 450 MG TABS Take 450 tablets by mouth daily.    [provider]  fluticasone-salmeterol (ADVAIR HFA) 45-21 MCG/ACT inhaler Inhale 1 puff into the lungs 2 (two) times daily.    [provider]  ipratropium (ATROVENT) 0.06 % nasal spray Place 2 sprays into both nostrils in the morning and  at bedtime.    [provider]  levothyroxine (SYNTHROID) 75 MCG tablet Take 75 mcg by mouth daily before breakfast.    [provider]  magnesium hydroxide (MILK OF MAGNESIA) 400 MG/5ML suspension Take 30 mLs by mouth daily as needed for mild constipation (if no BM in 3 days).    [provider]  Multiple Vitamins-Minerals (MULTIVITAMIN WITH MINERALS) tablet Take 1 tablet by mouth daily.    [provider]  predniSONE (DELTASONE) 5 MG tablet Take 5 mg by mouth daily with breakfast.    [provider]  senna (SENOKOT) 8.6 MG TABS tablet Take 1 tablet by mouth daily.    [provider]  simvastatin (ZOCOR) 10 MG tablet Take 10 mg by mouth every other day.    [provider]    Allergies    Penicillin g and Penicillins  Review of Systems   Review of Systems  Gastrointestinal:  Positive for abdominal pain.  All other systems reviewed and are negative.  Physical Exam Updated Vital Signs BP (!) 119/94   Pulse 98   Temp (!) 101 F (38.3 C) (Rectal)   Resp 20   SpO2 94%   Physical Exam Vitals and nursing note reviewed.  Constitutional:      Comments: Contracted and demented and unable answer question  HENT:     Head: Normocephalic.     Nose: Nose normal.     Mouth/Throat:     Mouth: Mucous membranes are dry.  Eyes:     Extraocular Movements: Extraocular movements intact.     Pupils: Pupils are equal, round, and reactive to light.  Cardiovascular:     Rate and Rhythm: Normal rate and regular rhythm.     Pulses: Normal pulses.     Heart sounds: Normal heart sounds.  Pulmonary:     Comments: Tachypneic, diminished bilateral bases Abdominal:     Comments: Slightly distended but nontender, ?  Small lipoma versus mass in the epigastrium  Musculoskeletal:        General: Normal range of motion.     Cervical back: Normal range of motion and neck supple.  Skin:    General: Skin is warm.  Neurological:     Mental  Status: She is alert.     Comments: Patient is demented and contracted and unable to answer questions.  Moving all extremities  Psychiatric:     Comments: Unable     ED Results / Procedures / Treatments   Labs (all labs ordered are listed, but only abnormal results are displayed) Labs Reviewed  I-STAT CHEM 8, ED - Abnormal; Notable for the following components:      Result Value   Glucose, Bld 106 (*)    Calcium, Ion 1.11 (*)    All other components within normal limits  RESP PANEL BY RT-PCR (FLU A&B, COVID) ARPGX2  CULTURE,  BLOOD (ROUTINE X 2)  CULTURE, BLOOD (ROUTINE X 2)  URINE CULTURE  LACTIC ACID, PLASMA  LACTIC ACID, PLASMA  COMPREHENSIVE METABOLIC PANEL  CBC WITH DIFFERENTIAL/PLATELET  PROTIME-INR  APTT  URINALYSIS, ROUTINE W REFLEX MICROSCOPIC    EKG None  Radiology DG Chest Port 1 View  Result Date: 09/06/2020 CLINICAL DATA:  Fever.  Sepsis. EXAM: PORTABLE CHEST 1 VIEW COMPARISON:  02/01/2020 FINDINGS: Stable cardiomediastinal contours. No focal airspace consolidation, pleural effusion, or pneumothorax. Chronic deformity of the proximal left humerus. IMPRESSION: No active disease. Electronically Signed   By: Duanne Guess D.O.   On: 09/06/2020 21:22    Procedures Procedures   Angiocath insertion Performed by: Richardean Canal  Consent: Verbal consent obtained. Risks and benefits: risks, benefits and alternatives were discussed Time out: Immediately prior to procedure a "time out" was called to verify the correct patient, procedure, equipment, support staff and site/side marked as required.  Preparation: Patient was prepped and draped in the usual sterile fashion.  Vein Location: R antecube  Ultrasound Guided  Gauge: 20 long   Normal blood return and flush without difficulty Patient tolerance: Patient tolerated the procedure well with no immediate complications.    Medications Ordered in ED Medications  lactated ringers infusion (has no  administration in time range)  lactated ringers bolus 1,000 mL (has no administration in time range)    And  lactated ringers bolus 1,000 mL (has no administration in time range)  cefTRIAXone (ROCEPHIN) 2 g in sodium chloride 0.9 % 100 mL IVPB (has no administration in time range)  metroNIDAZOLE (FLAGYL) IVPB 500 mg (has no administration in time range)  acetaminophen (TYLENOL) suppository 650 mg (has no administration in time range)    ED Course  I have reviewed the triage vital signs and the nursing notes.  Pertinent labs & imaging results that were available during my care of the patient were reviewed by me and considered in my medical decision making (see chart for details).    MDM Rules/Calculators/A&P                          Jaclyn Weaver is a 87 y.o. female here presenting with fever and abdominal distention.  Patient has a history of kidney stone previously.  Consider pyelonephritis versus pneumonia versus COVID versus infected kidney stone versus abdominal mass.  We will do sepsis work-up with CBC and CMP and lactate and cultures and chest x-ray and CT abdomen pelvis.  Will In-and out cath for urinalysis   10:56 PM UA showed positive leukocytes and greater than 50 white blood cells.  Chest x-ray is clear.  Per the family, patient is still altered compared to baseline.  At baseline she answers yes or no questions but she cannot even recognize the daughter right now.    11:29 PM Patient is went to CT.  Anticipate admission for encephalopathy from UTI if CT unremarkable. Signed out to Dr. Bebe Shaggy in the ED.    Final Clinical Impression(s) / ED Diagnoses Final diagnoses:  None    Rx / DC Orders ED Discharge Orders     None        Charlynne Pander, MD 09/06/20 2329

## 2020-09-07 ENCOUNTER — Encounter (HOSPITAL_COMMUNITY): Payer: Self-pay | Admitting: Family Medicine

## 2020-09-07 DIAGNOSIS — J449 Chronic obstructive pulmonary disease, unspecified: Secondary | ICD-10-CM | POA: Diagnosis not present

## 2020-09-07 DIAGNOSIS — G9340 Encephalopathy, unspecified: Secondary | ICD-10-CM | POA: Diagnosis not present

## 2020-09-07 DIAGNOSIS — F0280 Dementia in other diseases classified elsewhere without behavioral disturbance: Secondary | ICD-10-CM

## 2020-09-07 DIAGNOSIS — U071 COVID-19: Principal | ICD-10-CM

## 2020-09-07 DIAGNOSIS — G309 Alzheimer's disease, unspecified: Secondary | ICD-10-CM

## 2020-09-07 DIAGNOSIS — N3000 Acute cystitis without hematuria: Secondary | ICD-10-CM

## 2020-09-07 DIAGNOSIS — Z86718 Personal history of other venous thrombosis and embolism: Secondary | ICD-10-CM

## 2020-09-07 LAB — MRSA NEXT GEN BY PCR, NASAL: MRSA by PCR Next Gen: DETECTED — AB

## 2020-09-07 LAB — RESP PANEL BY RT-PCR (FLU A&B, COVID) ARPGX2
Influenza A by PCR: NEGATIVE
Influenza B by PCR: NEGATIVE
SARS Coronavirus 2 by RT PCR: POSITIVE — AB

## 2020-09-07 LAB — PROCALCITONIN: Procalcitonin: <0.1 ng/mL

## 2020-09-07 MED ORDER — SODIUM CHLORIDE 0.9 % IV SOLN
200.0000 mg | Freq: Once | INTRAVENOUS | Status: AC
  Administered 2020-09-07: 200 mg via INTRAVENOUS
  Filled 2020-09-07: qty 200

## 2020-09-07 MED ORDER — ENSURE ENLIVE PO LIQD
237.0000 mL | Freq: Two times a day (BID) | ORAL | Status: DC
  Administered 2020-09-08 – 2020-09-10 (×5): 237 mL via ORAL

## 2020-09-07 MED ORDER — ALBUTEROL SULFATE HFA 108 (90 BASE) MCG/ACT IN AERS: 2.0000 | INHALATION_SPRAY | RESPIRATORY_TRACT | Status: DC | PRN

## 2020-09-07 MED ORDER — SODIUM CHLORIDE 0.9 % IV SOLN
100.0000 mg | Freq: Every day | INTRAVENOUS | Status: AC
  Administered 2020-09-08 – 2020-09-09 (×2): 100 mg via INTRAVENOUS
  Filled 2020-09-07 (×2): qty 20

## 2020-09-07 MED ORDER — MOMETASONE FURO-FORMOTEROL FUM 100-5 MCG/ACT IN AERO
2.0000 | INHALATION_SPRAY | Freq: Two times a day (BID) | RESPIRATORY_TRACT | Status: DC
  Administered 2020-09-07 – 2020-09-10 (×8): 2 via RESPIRATORY_TRACT
  Filled 2020-09-07 (×2): qty 8.8

## 2020-09-07 MED ORDER — APIXABAN 5 MG PO TABS
5.0000 mg | ORAL_TABLET | Freq: Two times a day (BID) | ORAL | Status: DC
  Administered 2020-09-07 – 2020-09-08 (×3): 5 mg via ORAL
  Filled 2020-09-07 (×3): qty 1

## 2020-09-07 MED ORDER — PREDNISONE 5 MG PO TABS
5.0000 mg | ORAL_TABLET | Freq: Every day | ORAL | Status: DC
  Administered 2020-09-07 – 2020-09-10 (×4): 5 mg via ORAL
  Filled 2020-09-07 (×5): qty 1

## 2020-09-07 MED ORDER — ONDANSETRON HCL 4 MG PO TABS: 4.0000 mg | ORAL_TABLET | Freq: Four times a day (QID) | ORAL | Status: DC | PRN

## 2020-09-07 MED ORDER — SODIUM CHLORIDE 0.9 % IV SOLN
2.0000 g | Freq: Two times a day (BID) | INTRAVENOUS | Status: DC
  Administered 2020-09-07 – 2020-09-08 (×3): 2 g via INTRAVENOUS
  Filled 2020-09-07 (×4): qty 2

## 2020-09-07 MED ORDER — ACETAMINOPHEN 325 MG PO TABS
650.0000 mg | ORAL_TABLET | Freq: Four times a day (QID) | ORAL | Status: DC | PRN
  Administered 2020-09-08: 650 mg via ORAL
  Filled 2020-09-07: qty 2

## 2020-09-07 MED ORDER — SIMVASTATIN 10 MG PO TABS
10.0000 mg | ORAL_TABLET | ORAL | Status: DC
  Administered 2020-09-10: 10 mg via ORAL
  Filled 2020-09-07 (×2): qty 1

## 2020-09-07 MED ORDER — ONDANSETRON HCL 4 MG/2ML IJ SOLN: 4.0000 mg | Freq: Four times a day (QID) | INTRAMUSCULAR | Status: DC | PRN

## 2020-09-07 MED ORDER — LEVOTHYROXINE SODIUM 50 MCG PO TABS
75.0000 ug | ORAL_TABLET | Freq: Every day | ORAL | Status: DC
  Administered 2020-09-07 – 2020-09-11 (×5): 75 ug via ORAL
  Filled 2020-09-07 (×5): qty 1

## 2020-09-07 MED ORDER — SENNOSIDES-DOCUSATE SODIUM 8.6-50 MG PO TABS: 1.0000 | ORAL_TABLET | Freq: Every evening | ORAL | Status: DC | PRN

## 2020-09-07 MED ORDER — PNEUMOCOCCAL VAC POLYVALENT 25 MCG/0.5ML IJ INJ
0.5000 mL | INJECTION | INTRAMUSCULAR | Status: AC
Start: 2020-09-08 — End: 2020-09-11
  Administered 2020-09-11: 0.5 mL via INTRAMUSCULAR
  Filled 2020-09-07 (×2): qty 0.5

## 2020-09-07 MED ORDER — BISACODYL 5 MG PO TBEC: 5.0000 mg | DELAYED_RELEASE_TABLET | Freq: Every day | ORAL | Status: DC | PRN

## 2020-09-07 NOTE — H&P (Addendum)
History and Physical    Jaclyn Weaver RUE:[id number] DOB: [birth year] DOA: 09/06/2020  PCP: Soundra Pilon, FNP   Patient coming from: [facility] memory unit  Chief Complaint: Fatigue, not eating, fever   HPI: Jaclyn Weaver is a 87 y.o. female with medical history significant for steroid-dependent COPD, dementia, and history of DVT on Eliquis, now presenting from her nursing facility for evaluation of fever, anorexia, and somnolence.  Patient's daughter reports that nursing facility staff informed her that the patient has not been eating for the past 2 days, has been somnolent, and was noted to have a fever.  She had been receiving treatment for constipation and had a large bowel movement yesterday.  Patient's daughter notes that patient has had similar presentation in the past when she has been diagnosed with a UTI.  Patient is unable to contribute to the history.  At baseline, she reportedly answers some yes or no questions intermittently but has not been talking and seemed to not recognize her daughter today.  She has history of Pseudomonas and enterococcal UTI. Her liquids are thickened at the nursing facility.   ED Course: Upon arrival to the ED, patient is found to be febrile to 38.3 C, saturating mid 90s on room air, and with stable blood pressure.  Chemistry panel unremarkable.  CBC with mild polycythemia and macrocytosis.  Lactic acid reassuringly normal.  COVID PCR positive.  Head CT negative for acute findings.  CT of the abdomen and pelvis notable for large hiatal hernia and mild constipation.  Chest x-ray negative for acute cardiopulmonary disease.  Urinalysis features moderate leukocytes, many bacteria, and greater than 50 WBC/hpf. Blood and urine cultures were collected in the ED and the patient was given 2 L of saline, acetaminophen, Rocephin, Flagyl, and started on remdesivir.  Review of Systems:  Unable to complete ROS secondary the patient's clinical condition.  Past  Medical History:  Diagnosis Date   Constipation    from facility paperwork   COPD (chronic obstructive pulmonary disease) (HCC)    Dementia (HCC)    from facility paperwork   Dermatitis    from facility paperwork   Hyperlipidemia    from facility paperwork   Hypertension    Hypothyroidism    Osteoporosis    from facility paperwork   Urinary incontinence    from facility paperwork   Vascular degeneration    Pt is nonverbal due to this    Past Surgical History:  Procedure Laterality Date   CYSTOSCOPY W/ URETERAL STENT PLACEMENT Left 02/02/2020   Procedure: CYSTOSCOPY WITH RETROGRADE PYELOGRAM/URETERAL STENT PLACEMENT;  Surgeon: Jannifer Hick, MD;  Location: [facility];  Service: Urology;  Laterality: Left;   CYSTOSCOPY WITH RETROGRADE PYELOGRAM, URETEROSCOPY AND STENT PLACEMENT Left 02/09/2020   Procedure: CYSTOSCOPY WITH RETROGRADE PYELOGRAM, URETEROSCOPY AND STENT REPLACEMENT;  Surgeon: Sebastian Ache, MD;  Location: [facility];  Service: Urology;  Laterality: Left;  75 MINS   HOLMIUM LASER APPLICATION Left 02/09/2020   Procedure: HOLMIUM LASER APPLICATION;  Surgeon: Sebastian Ache, MD;  Location: [facility];  Service: Urology;  Laterality: Left;   NO PAST SURGERIES      Social History:   reports that she has never smoked. She has never used smokeless tobacco. She reports that she does not drink alcohol and does not use drugs.  Allergies  Allergen Reactions   Penicillin G Shortness Of Breath   Penicillins     History reviewed. No pertinent family history.   Prior to Admission medications  Medication Sig Start Date End Date Taking? Authorizing Provider  acetaminophen (TYLENOL) 325 MG tablet Take 650 mg by mouth every 4 (four) hours as needed (for pain or a temperature greater than 100 F).    [provider]  alendronate-cholecalciferol (FOSAMAX PLUS D) 70-2800 MG-UNIT tablet Take 1 tablet by mouth every 7 (seven) days. Take with a full glass of water on an empty stomach.     [provider]  apixaban (ELIQUIS) 5 MG TABS tablet Take 5 mg by mouth 2 (two) times daily.    [provider]  bisacodyl (DULCOLAX) 10 MG suppository Place 10 mg rectally daily as needed (for constipation- "if no BM by the 4th day").    [provider]  Calcium Carbonate-Vitamin D 600-400 MG-UNIT tablet Take 1 tablet by mouth daily.    [provider]  Cranberry 450 MG TABS Take 450 tablets by mouth daily.    [provider]  fluticasone-salmeterol (ADVAIR HFA) 45-21 MCG/ACT inhaler Inhale 1 puff into the lungs 2 (two) times daily.    [provider]  ipratropium (ATROVENT) 0.06 % nasal spray Place 2 sprays into both nostrils in the morning and at bedtime.    [provider]  levothyroxine (SYNTHROID) 75 MCG tablet Take 75 mcg by mouth daily before breakfast.    [provider]  magnesium hydroxide (MILK OF MAGNESIA) 400 MG/5ML suspension Take 30 mLs by mouth daily as needed for mild constipation (if no BM in 3 days).    [provider]  Multiple Vitamins-Minerals (MULTIVITAMIN WITH MINERALS) tablet Take 1 tablet by mouth daily.    [provider]  predniSONE (DELTASONE) 5 MG tablet Take 5 mg by mouth daily with breakfast.    [provider]  senna (SENOKOT) 8.6 MG TABS tablet Take 1 tablet by mouth daily.    [provider]  simvastatin (ZOCOR) 10 MG tablet Take 10 mg by mouth every other day.    [provider]    Physical Exam: Vitals:   09/06/20 2100 09/06/20 2230 09/06/20 2235 09/06/20 2300  BP: (!) 143/82  (!) 153/85 (!) 142/77  Pulse: (!) 101 (!) 101 98 93  Resp:  (!) 27 (!) 23 (!) 24  Temp:      TempSrc:      SpO2: 97% 97% 96% 97%    Constitutional: NAD, somnolent   Eyes: PERTLA, lids and conjunctivae normal ENMT: Mucous membranes are moist. Posterior pharynx clear of any exudate or lesions.   Neck: supple, no masses  Respiratory:  no wheezing, no crackles. No  accessory muscle use.  Cardiovascular: S1 & S2 heard, regular rate and rhythm. No extremity edema.   Abdomen: No distension, no tenderness, soft. Bowel sounds active.  Musculoskeletal: no clubbing / cyanosis. No joint deformity upper and lower extremities.   Skin: no significant rashes, lesions, ulcers. Poor turgor. Neurologic: No gross facial asymmetry. Somnolent, opens eyes briefly to loud voice. Moving all extremities.     Labs and Imaging on Admission: I have personally reviewed following labs and imaging studies  CBC: Recent Labs  Lab 09/06/20 2100 09/06/20 2203  WBC 8.4  --   NEUTROABS 6.2  --   HGB 15.6* 12.6  HCT 47.6* 37.0  MCV 102.8*  --   PLT 175  --    Basic Metabolic Panel: Recent Labs  Lab 09/06/20 2100 09/06/20 2203  NA 140 138  K 4.1 4.2  CL 106 106  CO2 24  --   GLUCOSE  106* 106*  BUN 22 22  CREATININE 0.75 0.70  CALCIUM 9.4  --    GFR: CrCl cannot be calculated (Unknown ideal weight.). Liver Function Tests: Recent Labs  Lab 09/06/20 2100  AST 15  ALT 14  ALKPHOS 46  BILITOT 0.6  PROT 6.6  ALBUMIN 3.5   No results for input(s): LIPASE, AMYLASE in the last 168 hours. No results for input(s): AMMONIA in the last 168 hours. Coagulation Profile: Recent Labs  Lab 09/06/20 2100  INR 1.1   Cardiac Enzymes: No results for input(s): CKTOTAL, CKMB, CKMBINDEX, TROPONINI in the last 168 hours. BNP (last 3 results) No results for input(s): PROBNP in the last 8760 hours. HbA1C: No results for input(s): HGBA1C in the last 72 hours. CBG: No results for input(s): GLUCAP in the last 168 hours. Lipid Profile: No results for input(s): CHOL, HDL, LDLCALC, TRIG, CHOLHDL, LDLDIRECT in the last 72 hours. Thyroid Function Tests: No results for input(s): TSH, T4TOTAL, FREET4, T3FREE, THYROIDAB in the last 72 hours. Anemia Panel: No results for input(s): VITAMINB12, FOLATE, FERRITIN, TIBC, IRON, RETICCTPCT in the last 72 hours. Urine analysis:     Component Value Date/Time   COLORURINE AMBER (A) 09/06/2020 2100   APPEARANCEUR CLOUDY (A) 09/06/2020 2100   LABSPEC 1.020 09/06/2020 2100   PHURINE 6.0 09/06/2020 2100   GLUCOSEU NEGATIVE 09/06/2020 2100   HGBUR NEGATIVE 09/06/2020 2100   BILIRUBINUR NEGATIVE 09/06/2020 2100   KETONESUR 20 (A) 09/06/2020 2100   PROTEINUR 30 (A) 09/06/2020 2100   NITRITE NEGATIVE 09/06/2020 2100   LEUKOCYTESUR MODERATE (A) 09/06/2020 2100   Sepsis Labs: @LABRCNTIP (procalcitonin:4,lacticidven:4) ) Recent Results (from the past 240 hour(s))  Resp Panel by RT-PCR (Flu A&B, Covid) Nasopharyngeal Swab     Status: Abnormal   Collection Time: 09/06/20  8:36 PM   Specimen: Nasopharyngeal Swab; Nasopharyngeal(NP) swabs in vial transport medium  Result Value Ref Range Status   SARS Coronavirus 2 by RT PCR POSITIVE (A) NEGATIVE Final    Comment: RESULT CALLED TO, READ BACK BY AND VERIFIED WITH: JESSICA LAUDERMILK AT 0003 ON 09/07/20 BY MAJ (NOTE) SARS-CoV-2 target nucleic acids are DETECTED.  The SARS-CoV-2 RNA is generally detectable in upper respiratory specimens during the acute phase of infection. Positive results are indicative of the presence of the identified virus, but do not rule out bacterial infection or co-infection with other pathogens not detected by the test. Clinical correlation with patient history and other diagnostic information is necessary to determine patient infection status. The expected result is Negative.  Fact Sheet for Patients: BloggerCourse.comhttps://www.fda.gov/media/152166/download  Fact Sheet for Healthcare Providers: SeriousBroker.ithttps://www.fda.gov/media/152162/download  This test is not yet approved or cleared by the [place] FDA and  has been authorized for detection and/or diagnosis of SARS-CoV-2 by FDA under an Emergency Use Authorization (EUA).  This EUA will remain in effect (meaning this  test can be used) for the duration of  the COVID-19 declaration under Section 564(b)(1) of  the Act, 21 U.S.C. section 360bbb-3(b)(1), unless the authorization is terminated or revoked sooner.     Influenza A by PCR NEGATIVE NEGATIVE Final   Influenza B by PCR NEGATIVE NEGATIVE Final    Comment: (NOTE) The Xpert Xpress SARS-CoV-2/FLU/RSV plus assay is intended as an aid in the diagnosis of influenza from Nasopharyngeal swab specimens and should not be used as a sole basis for treatment. Nasal washings and aspirates are unacceptable for Xpert Xpress SARS-CoV-2/FLU/RSV testing.  Fact Sheet for Patients: BloggerCourse.comhttps://www.fda.gov/media/152166/download  Fact Sheet for Healthcare Providers: SeriousBroker.ithttps://www.fda.gov/media/152162/download  This  test is not yet approved or cleared by the [place] and has been authorized for detection and/or diagnosis of SARS-CoV-2 by FDA under an Emergency Use Authorization (EUA). This EUA will remain in effect (meaning this test can be used) for the duration of the COVID-19 declaration under Section 564(b)(1) of the Act, 21 U.S.C. section 360bbb-3(b)(1), unless the authorization is terminated or revoked.  Performed at [facility], [address], [place], [place] [address]      Radiological Exams on Admission: CT HEAD WO CONTRAST ( )  Result Date: 09/06/2020 CLINICAL DATA:  Mental status change and fever EXAM: CT HEAD WITHOUT CONTRAST TECHNIQUE: Contiguous axial images were obtained from the base of the skull through the vertex without intravenous contrast. COMPARISON:  CT brain 01/27/2020 FINDINGS: Brain: No acute territorial infarction, hemorrhage or intracranial mass. Moderate atrophy. Mild chronic small vessel ischemic changes of the white matter. Cerebellar atrophy. Stable ventricle size Vascular: No hyperdense vessels.  Carotid vascular calcification. Skull: Normal. Negative for fracture or focal lesion. Sinuses/Orbits: Mucosal thickening in the sinuses Other: None IMPRESSION: 1. No CT evidence for acute intracranial  abnormality. 2. Atrophy and mild chronic small vessel ischemic changes of the white matter. Electronically Signed   By: Jasmine Pang M.D.   On: 09/06/2020 23:53   CT ABDOMEN PELVIS W CONTRAST  Result Date: 09/06/2020 CLINICAL DATA:  Fevers EXAM: CT ABDOMEN AND PELVIS WITH CONTRAST TECHNIQUE: Multidetector CT imaging of the abdomen and pelvis was performed using the standard protocol following bolus administration of intravenous contrast. CONTRAST:  32mL OMNIPAQUE IOHEXOL 350 MG/ML SOLN COMPARISON:  02/02/2020 FINDINGS: Lower chest: Lung bases are free of acute infiltrate or sizable effusion. Large hiatal hernia is noted. Hepatobiliary: No focal liver abnormality is seen. No gallstones, gallbladder wall thickening, or biliary dilatation. Pancreas: Unremarkable. No pancreatic ductal dilatation or surrounding inflammatory changes. Spleen: Normal in size without focal abnormality. Adrenals/Urinary Tract: Adrenal glands are within normal limits. Kidneys demonstrate a normal enhancement pattern. Peripelvic cysts are noted bilaterally. No obstructive changes are seen. Ureters are within normal limits. The bladder is decompressed. Stomach/Bowel: Fecal material is noted within the rectum consistent with mild impaction. Scattered fecal material in the colon is seen consistent with a mild degree of constipation. No obstructive or inflammatory changes of the colon are noted. The appendix is within normal limits. No obstructive or inflammatory changes of the small bowel are seen. Stomach again shows a large hiatal hernia. Vascular/Lymphatic: Aortic atherosclerosis. No enlarged abdominal or pelvic lymph nodes. Reproductive: Status post hysterectomy. No adnexal masses. Other: No abdominal wall hernia or abnormality. No abdominopelvic ascites. Musculoskeletal: Degenerative changes of lumbar spine are noted. IMPRESSION: Large hiatal hernia. No mass is identified. Mild changes of constipation in the colon. Electronically Signed    By: Alcide Clever M.D.   On: 09/06/2020 23:59   DG Chest Port 1 View  Result Date: 09/06/2020 CLINICAL DATA:  Fever.  Sepsis. EXAM: PORTABLE CHEST 1 VIEW COMPARISON:  02/01/2020 FINDINGS: Stable cardiomediastinal contours. No focal airspace consolidation, pleural effusion, or pneumothorax. Chronic deformity of the proximal left humerus. IMPRESSION: No active disease. Electronically Signed   By: Duanne Guess D.O.   On: 09/06/2020 21:22     Assessment/Plan   1. Acute encephalopathy  - Presents with 2 days of anorexia and somnolence and was noted to be febrile  - No acute findings on head CT and no electrolyte derangements in ED, no meningismus  - Likely secondary to acute infection (COVID-19 and possibly UTI)  -  Treat infections as below, continue supportive care and close monitoring   2. COVID-19  - Presents with fever, anorexia, and somnolence and found to have COVID-19  - CXR is clear in ED  - Remdesivir was started in ED  - Continue remdesivir, supportive care, trend markers   3. ?UTI  - Patient with hx of UTIs presents with fever and somnolence, initially attributed to UTI but also found to have COVID  - UA consistent with possible UTI; no leukocytosis present  - Blood and urine culture were collected in ED and she was given Rocephin  - Continue empiric treatment with cefepime given hx of Pseudomonas UTI, follow cultures   4. COPD  - No wheezing on admission  - Continue low-dose prednisone, ICS/LABA, as needed SABA    5. Hx of VTE  - Continue Eliquis    6. Hypothyroidism  - Continue Synthroid    7. Dementia  - Supportive care, delirium precautions    DVT prophylaxis: Eliquis  Code Status: [name], confirmed on admission  Level of Care: Level of care: Med-Surg Family Communication: Daughter updated by phone  Disposition Plan:  Patient is from: [facility] memory care  Anticipated d/c is to: Memory care unit  Anticipated d/c date is: 8/14 or 09/09/20 Patient  currently: pending improvement in mental status, ability to tolerate adequate oral intake  Consults called: none  Admission status: Observation    Briscoe Deutscher, MD [facility]  09/07/2020, 1:09 AM

## 2020-09-07 NOTE — ED Notes (Signed)
Pt daughter updated of pt room 1436

## 2020-09-07 NOTE — ED Provider Notes (Signed)
I assumed care at signout to follow-up on imaging and admit patient.  CT head and CT abdomen pelvis were both negative.  Patient is found to be positive for COVID-19 Patient currently resting comfortably, but does not respond to voice. Meds have been ordered.  Discussed with Dr. Antionette Char for admission   Zadie Rhine, MD 09/07/20 [phone]

## 2020-09-07 NOTE — ED Notes (Signed)
Pt daughter Rosalita Chessman updated via phone.

## 2020-09-07 NOTE — Progress Notes (Signed)
Pharmacy Antibiotic Note  Jaclyn Weaver is a 87 y.o. female admitted on 09/06/2020 with fever, anorexia and somnolence.  Pharmacy has been consulted to dose cefepime for UTI  Plan: Cefepime 2gm IV q12h Follow renal function, cultures and clinical course     Temp (24hrs), Avg:101 F (38.3 C), Min:101 F (38.3 C), Max:101 F (38.3 C)  Recent Labs  Lab 09/06/20 2100 09/06/20 2203  WBC 8.4  --   CREATININE 0.75 0.70  LATICACIDVEN 1.1  --     CrCl cannot be calculated (Unknown ideal weight.).    Allergies  Allergen Reactions   Penicillin G Shortness Of Breath   Penicillins      Thank you for allowing pharmacy to be a part of this patient's care.  Arley Phenix RPh 09/07/2020, 1:43 AM

## 2020-09-07 NOTE — Progress Notes (Signed)
PROGRESS NOTE  Jaclyn Weaver HCW:[id number] DOB: Apr 21, 1930   PCP: Soundra Pilon, FNP  Patient is from: [facility] memory unit.  "At baseline, she reportedly answers some yes or no questions intermittently but has not been talking and seemed to not recognize her daughter today"  DOA: 09/06/2020 LOS: 0  Chief complaints:  Fatigue, decreased oral intake and fever    Brief Narrative / Interim history: 87 year old F with PMH of severe dementia, steroid-dependent COPD, DVT on Eliquis, dysphagia, HTN, HLD and urinary incontinence brought to ED with concern for fever, decreased oral intake and decreased mental status, and admitted for acute encephalopathy in the setting of COVID-19 infection and possible UTI.  She was febrile to 38.3 C but no leukocytosis or lactic acidosis.  COVID-19 PCR positive.  CT head negative.  CT a/p without acute finding.  UA concerning for UTI.  Cultures obtained.  Started on IV cefepime given history of Pseudomonas UTI.   Subjective: Seen and examined earlier this morning.  No major events overnight of this morning.  Patient is awake but not interactive.  She does not follows commands either.  No apparent distress.  Hemodynamically stable.  Saturating 100% on RA.  No apparent focal neurodeficit but limited exam.   Objective: Vitals:   09/07/20 0600 09/07/20 0630 09/07/20 0730 09/07/20 0800  BP: 138/72 (!) 142/71 (!) 141/69 133/69  Pulse: 84 79 84 86  Resp: (!) 27 (!) 23 20 18   Temp:    98.7 F (37.1 C)  TempSrc:    Oral  SpO2: 96% 97% 95% 94%   No intake or output data in the 24 hours ending 09/07/20 1154 There were no vitals filed for this visit.  Examination:  GENERAL: No apparent distress.  Nontoxic. HEENT: MMM.  Vision and hearing grossly intact.  NECK: Supple.  No apparent JVD.  RESP: 100% on RA.  No IWOB.  Fair aeration bilaterally. CVS:  RRR. Heart sounds normal.  ABD/GI/GU: BS+. Abd soft, NTND.  MSK/EXT:  Moves extremities. No apparent  deformity. No edema.  SKIN: no apparent skin lesion or wound NEURO: Awake and alert.  Does not interact verbally.  Does not follows command.  No apparent focal neuro deficit but limited exam due to patient mental status. PSYCH: Calm.  No distress or agitation.  Procedures:  None  Microbiology summarized: 8/12-COVID-19 PCR positive. 8/12-blood cultures pending 8/12-urine cultures pending  Assessment & Plan: Acute metabolic encephalopathy-she is awake and alert but does not talk or follow command.  Per daughter she answer yes or or no questions at baseline.  CT head without acute finding.  No apparent focal neurodeficit but limited exam due to body habitus.  Encephalopathy could be due to COVID-19 and possible UTI. -Delirium, aspiration and fall precautions precautions -Continue IV antibiotics for presumed UTI although a difficult diagnosis to make -Allow family to stay with patient 24/7 if they can  Possible UTI: Difficult diagnosis to make without history.  She has fever but no leukocytosis.  Pro-Cal and lactic acid negative.  No suprapubic tenderness.  UA concerning for UTI.  Has history of Pseudomonas UTI. -Continue IV cefepime pending cultures   COVID-19-could be contributing to his symptoms of fever, anorexia and somnolence.  No respiratory symptoms.  CXR clear. -Continue remdesivir for 3 days-started in ED -She is already on low dose prednisone for COPD   Chronic COPD: Stable.  100% on RA. -Continue low-dose prednisone, ICS/LABA, as needed SABA     Hx of VTE/DVT on Eliquis -Continue  Eliquis     Hypothyroidism -Continue home Synthroid. - Continue Synthroid     Severe dementia without behavioral disturbance: See encephalopathy above.  Debility/generalized weakness-wheelchair dependent but able to maneuver her wheelchair at baseline -PT/OT eval  Decreased oral intake/dysphagia-on pure diet at baseline? -SLP eval -Liberate diet. -Consult dietitian   There is no height  or weight on file to calculate BMI.         DVT prophylaxis:   apixaban (ELIQUIS) tablet 5 mg  Code Status: [name]/DNI Family Communication: Updated patient's daughter over the phone Level of care: Med-Surg Status is: Observation  The patient will require care spanning > 2 midnights and should be moved to inpatient because: Altered mental status, IV treatments appropriate due to intensity of illness or inability to take PO, and Inpatient level of care appropriate due to severity of illness  Dispo: The patient is from: ALF              Anticipated d/c is to: ALF              Patient currently is not medically stable to d/c.   Difficult to place patient No       Consultants:  None   Sch Meds:  Scheduled Meds:  apixaban  5 mg Oral BID   levothyroxine  75 mcg Oral Q0600   mometasone-formoterol  2 puff Inhalation BID   predniSONE  5 mg Oral Q breakfast   [START ON 09/08/2020] simvastatin  10 mg Oral QODAY   Continuous Infusions:  ceFEPime (MAXIPIME) IV 2 g (09/07/20 0825)   [START ON 09/08/2020] remdesivir 100 mg in NS 100 mL     PRN Meds:.acetaminophen, albuterol, bisacodyl, ondansetron **OR** ondansetron (ZOFRAN) IV, senna-docusate  Antimicrobials: Anti-infectives (From admission, onward)    Start     Dose/Rate Route Frequency Ordered Stop   09/08/20 1000  remdesivir 100 mg in sodium chloride 0.9 % 100 mL IVPB       See Hyperspace for full Linked Orders Report.   100 mg 200 mL/hr over 30 Minutes Intravenous Daily 09/07/20 0022 09/12/20 0959   09/07/20 0800  ceFEPIme (MAXIPIME) 2 g in sodium chloride 0.9 % 100 mL IVPB        2 g 200 mL/hr over 30 Minutes Intravenous Every 12 hours 09/07/20 0141     09/07/20 0100  remdesivir 200 mg in sodium chloride 0.9% 250 mL IVPB       See Hyperspace for full Linked Orders Report.   200 mg 580 mL/hr over 30 Minutes Intravenous Once 09/07/20 0022 09/07/20 0602   09/06/20 2115  cefTRIAXone (ROCEPHIN) 2 g in sodium chloride 0.9 % 100  mL IVPB        2 g 200 mL/hr over 30 Minutes Intravenous  Once 09/06/20 2100 09/07/20 0037   09/06/20 2115  metroNIDAZOLE (FLAGYL) IVPB 500 mg  Status:  Discontinued        500 mg 100 mL/hr over 60 Minutes Intravenous  Once 09/06/20 2100 09/07/20 0059        I have personally reviewed the following labs and images: CBC: Recent Labs  Lab 09/06/20 2100 09/06/20 2203  WBC 8.4  --   NEUTROABS 6.2  --   HGB 15.6* 12.6  HCT 47.6* 37.0  MCV 102.8*  --   PLT 175  --    BMP &GFR Recent Labs  Lab 09/06/20 2100 09/06/20 2203  NA 140 138  K 4.1 4.2  CL 106 106  CO2 24  --  GLUCOSE 106* 106*  BUN 22 22  CREATININE 0.75 0.70  CALCIUM 9.4  --    CrCl cannot be calculated (Unknown ideal weight.). Liver & Pancreas: Recent Labs  Lab 09/06/20 2100  AST 15  ALT 14  ALKPHOS 46  BILITOT 0.6  PROT 6.6  ALBUMIN 3.5   No results for input(s): LIPASE, AMYLASE in the last 168 hours. No results for input(s): AMMONIA in the last 168 hours. Diabetic: No results for input(s): HGBA1C in the last 72 hours. No results for input(s): GLUCAP in the last 168 hours. Cardiac Enzymes: No results for input(s): CKTOTAL, CKMB, CKMBINDEX, TROPONINI in the last 168 hours. No results for input(s): PROBNP in the last 8760 hours. Coagulation Profile: Recent Labs  Lab 09/06/20 2100  INR 1.1   Thyroid Function Tests: No results for input(s): TSH, T4TOTAL, FREET4, T3FREE, THYROIDAB in the last 72 hours. Lipid Profile: No results for input(s): CHOL, HDL, LDLCALC, TRIG, CHOLHDL, LDLDIRECT in the last 72 hours. Anemia Panel: No results for input(s): VITAMINB12, FOLATE, FERRITIN, TIBC, IRON, RETICCTPCT in the last 72 hours. Urine analysis:    Component Value Date/Time   COLORURINE AMBER (A) 09/06/2020 2100   APPEARANCEUR CLOUDY (A) 09/06/2020 2100   LABSPEC 1.020 09/06/2020 2100   PHURINE 6.0 09/06/2020 2100   GLUCOSEU NEGATIVE 09/06/2020 2100   HGBUR NEGATIVE 09/06/2020 2100   BILIRUBINUR  NEGATIVE 09/06/2020 2100   KETONESUR 20 (A) 09/06/2020 2100   PROTEINUR 30 (A) 09/06/2020 2100   NITRITE NEGATIVE 09/06/2020 2100   LEUKOCYTESUR MODERATE (A) 09/06/2020 2100   Sepsis Labs: Invalid input(s): PROCALCITONIN, LACTICIDVEN  Microbiology: Recent Results (from the past 240 hour(s))  Resp Panel by RT-PCR (Flu A&B, Covid) Nasopharyngeal Swab     Status: Abnormal   Collection Time: 09/06/20  8:36 PM   Specimen: Nasopharyngeal Swab; Nasopharyngeal(NP) swabs in vial transport medium  Result Value Ref Range Status   SARS Coronavirus 2 by RT PCR POSITIVE (A) NEGATIVE Final    Comment: RESULT CALLED TO, READ BACK BY AND VERIFIED WITH: JESSICA LAUDERMILK AT 0003 ON 09/07/20 BY MAJ (NOTE) SARS-CoV-2 target nucleic acids are DETECTED.  The SARS-CoV-2 RNA is generally detectable in upper respiratory specimens during the acute phase of infection. Positive results are indicative of the presence of the identified virus, but do not rule out bacterial infection or co-infection with other pathogens not detected by the test. Clinical correlation with patient history and other diagnostic information is necessary to determine patient infection status. The expected result is Negative.  Fact Sheet for Patients: BloggerCourse.com  Fact Sheet for Healthcare Providers: SeriousBroker.it  This test is not yet approved or cleared by the [place] FDA and  has been authorized for detection and/or diagnosis of SARS-CoV-2 by FDA under an Emergency Use Authorization (EUA).  This EUA will remain in effect (meaning this  test can be used) for the duration of  the COVID-19 declaration under Section 564(b)(1) of the Act, 21 U.S.C. section 360bbb-3(b)(1), unless the authorization is terminated or revoked sooner.     Influenza A by PCR NEGATIVE NEGATIVE Final   Influenza B by PCR NEGATIVE NEGATIVE Final    Comment: (NOTE) The Xpert Xpress  SARS-CoV-2/FLU/RSV plus assay is intended as an aid in the diagnosis of influenza from Nasopharyngeal swab specimens and should not be used as a sole basis for treatment. Nasal washings and aspirates are unacceptable for Xpert Xpress SARS-CoV-2/FLU/RSV testing.  Fact Sheet for Patients: BloggerCourse.com  Fact Sheet for Healthcare Providers: SeriousBroker.it  This test  is not yet approved or cleared by the [place] and has been authorized for detection and/or diagnosis of SARS-CoV-2 by FDA under an Emergency Use Authorization (EUA). This EUA will remain in effect (meaning this test can be used) for the duration of the COVID-19 declaration under Section 564(b)(1) of the Act, 21 U.S.C. section 360bbb-3(b)(1), unless the authorization is terminated or revoked.  Performed at [facility], [address], [place], [place] [address]     Radiology Studies: CT HEAD WO CONTRAST ( )  Result Date: 09/06/2020 CLINICAL DATA:  Mental status change and fever EXAM: CT HEAD WITHOUT CONTRAST TECHNIQUE: Contiguous axial images were obtained from the base of the skull through the vertex without intravenous contrast. COMPARISON:  CT brain 01/27/2020 FINDINGS: Brain: No acute territorial infarction, hemorrhage or intracranial mass. Moderate atrophy. Mild chronic small vessel ischemic changes of the white matter. Cerebellar atrophy. Stable ventricle size Vascular: No hyperdense vessels.  Carotid vascular calcification. Skull: Normal. Negative for fracture or focal lesion. Sinuses/Orbits: Mucosal thickening in the sinuses Other: None IMPRESSION: 1. No CT evidence for acute intracranial abnormality. 2. Atrophy and mild chronic small vessel ischemic changes of the white matter. Electronically Signed   By: Jasmine Pang M.D.   On: 09/06/2020 23:53   CT ABDOMEN PELVIS W CONTRAST  Result Date: 09/06/2020 CLINICAL DATA:  Fevers EXAM: CT  ABDOMEN AND PELVIS WITH CONTRAST TECHNIQUE: Multidetector CT imaging of the abdomen and pelvis was performed using the standard protocol following bolus administration of intravenous contrast. CONTRAST:  65mL OMNIPAQUE IOHEXOL 350 MG/ML SOLN COMPARISON:  02/02/2020 FINDINGS: Lower chest: Lung bases are free of acute infiltrate or sizable effusion. Large hiatal hernia is noted. Hepatobiliary: No focal liver abnormality is seen. No gallstones, gallbladder wall thickening, or biliary dilatation. Pancreas: Unremarkable. No pancreatic ductal dilatation or surrounding inflammatory changes. Spleen: Normal in size without focal abnormality. Adrenals/Urinary Tract: Adrenal glands are within normal limits. Kidneys demonstrate a normal enhancement pattern. Peripelvic cysts are noted bilaterally. No obstructive changes are seen. Ureters are within normal limits. The bladder is decompressed. Stomach/Bowel: Fecal material is noted within the rectum consistent with mild impaction. Scattered fecal material in the colon is seen consistent with a mild degree of constipation. No obstructive or inflammatory changes of the colon are noted. The appendix is within normal limits. No obstructive or inflammatory changes of the small bowel are seen. Stomach again shows a large hiatal hernia. Vascular/Lymphatic: Aortic atherosclerosis. No enlarged abdominal or pelvic lymph nodes. Reproductive: Status post hysterectomy. No adnexal masses. Other: No abdominal wall hernia or abnormality. No abdominopelvic ascites. Musculoskeletal: Degenerative changes of lumbar spine are noted. IMPRESSION: Large hiatal hernia. No mass is identified. Mild changes of constipation in the colon. Electronically Signed   By: Alcide Clever M.D.   On: 09/06/2020 23:59   DG Chest Port 1 View  Result Date: 09/06/2020 CLINICAL DATA:  Fever.  Sepsis. EXAM: PORTABLE CHEST 1 VIEW COMPARISON:  02/01/2020 FINDINGS: Stable cardiomediastinal contours. No focal airspace  consolidation, pleural effusion, or pneumothorax. Chronic deformity of the proximal left humerus. IMPRESSION: No active disease. Electronically Signed   By: Duanne Guess D.O.   On: 09/06/2020 21:22      Sherilyn Windhorst T. Jarae Panas [facility]  If 7PM-7AM, please contact night-coverage [email or web address] 09/07/2020, 11:54 AM

## 2020-09-08 DIAGNOSIS — Z86718 Personal history of other venous thrombosis and embolism: Secondary | ICD-10-CM | POA: Diagnosis not present

## 2020-09-08 DIAGNOSIS — M810 Age-related osteoporosis without current pathological fracture: Secondary | ICD-10-CM | POA: Diagnosis present

## 2020-09-08 DIAGNOSIS — B965 Pseudomonas (aeruginosa) (mallei) (pseudomallei) as the cause of diseases classified elsewhere: Secondary | ICD-10-CM | POA: Diagnosis present

## 2020-09-08 DIAGNOSIS — Z7189 Other specified counseling: Secondary | ICD-10-CM | POA: Diagnosis not present

## 2020-09-08 DIAGNOSIS — K5900 Constipation, unspecified: Secondary | ICD-10-CM | POA: Diagnosis present

## 2020-09-08 DIAGNOSIS — I10 Essential (primary) hypertension: Secondary | ICD-10-CM | POA: Diagnosis present

## 2020-09-08 DIAGNOSIS — U071 COVID-19: Secondary | ICD-10-CM | POA: Diagnosis present

## 2020-09-08 DIAGNOSIS — D7589 Other specified diseases of blood and blood-forming organs: Secondary | ICD-10-CM | POA: Diagnosis present

## 2020-09-08 DIAGNOSIS — D751 Secondary polycythemia: Secondary | ICD-10-CM | POA: Diagnosis present

## 2020-09-08 DIAGNOSIS — E039 Hypothyroidism, unspecified: Secondary | ICD-10-CM | POA: Diagnosis present

## 2020-09-08 DIAGNOSIS — Z66 Do not resuscitate: Secondary | ICD-10-CM | POA: Diagnosis present

## 2020-09-08 DIAGNOSIS — Z22322 Carrier or suspected carrier of Methicillin resistant Staphylococcus aureus: Secondary | ICD-10-CM | POA: Diagnosis not present

## 2020-09-08 DIAGNOSIS — N390 Urinary tract infection, site not specified: Secondary | ICD-10-CM | POA: Diagnosis present

## 2020-09-08 DIAGNOSIS — R32 Unspecified urinary incontinence: Secondary | ICD-10-CM | POA: Diagnosis present

## 2020-09-08 DIAGNOSIS — G9340 Encephalopathy, unspecified: Secondary | ICD-10-CM | POA: Diagnosis not present

## 2020-09-08 DIAGNOSIS — R5381 Other malaise: Secondary | ICD-10-CM | POA: Diagnosis present

## 2020-09-08 DIAGNOSIS — G9341 Metabolic encephalopathy: Secondary | ICD-10-CM | POA: Diagnosis present

## 2020-09-08 DIAGNOSIS — Z515 Encounter for palliative care: Secondary | ICD-10-CM | POA: Diagnosis not present

## 2020-09-08 DIAGNOSIS — J449 Chronic obstructive pulmonary disease, unspecified: Secondary | ICD-10-CM | POA: Diagnosis present

## 2020-09-08 DIAGNOSIS — K449 Diaphragmatic hernia without obstruction or gangrene: Secondary | ICD-10-CM | POA: Diagnosis present

## 2020-09-08 DIAGNOSIS — Z960 Presence of urogenital implants: Secondary | ICD-10-CM | POA: Diagnosis present

## 2020-09-08 DIAGNOSIS — F0150 Vascular dementia without behavioral disturbance: Secondary | ICD-10-CM | POA: Diagnosis present

## 2020-09-08 DIAGNOSIS — Z7952 Long term (current) use of systemic steroids: Secondary | ICD-10-CM | POA: Diagnosis not present

## 2020-09-08 DIAGNOSIS — R1310 Dysphagia, unspecified: Secondary | ICD-10-CM | POA: Diagnosis present

## 2020-09-08 DIAGNOSIS — E785 Hyperlipidemia, unspecified: Secondary | ICD-10-CM | POA: Diagnosis present

## 2020-09-08 DIAGNOSIS — Z23 Encounter for immunization: Secondary | ICD-10-CM | POA: Diagnosis present

## 2020-09-08 DIAGNOSIS — R7881 Bacteremia: Secondary | ICD-10-CM

## 2020-09-08 DIAGNOSIS — G309 Alzheimer's disease, unspecified: Secondary | ICD-10-CM | POA: Diagnosis not present

## 2020-09-08 DIAGNOSIS — N3000 Acute cystitis without hematuria: Secondary | ICD-10-CM | POA: Diagnosis not present

## 2020-09-08 DIAGNOSIS — Z993 Dependence on wheelchair: Secondary | ICD-10-CM | POA: Diagnosis not present

## 2020-09-08 LAB — CBC WITH DIFFERENTIAL/PLATELET
Abs Immature Granulocytes: 0.04 10*3/uL (ref 0.00–0.07)
Basophils Absolute: 0 10*3/uL (ref 0.0–0.1)
Basophils Relative: 1 %
Eosinophils Absolute: 0.2 10*3/uL (ref 0.0–0.5)
Eosinophils Relative: 2 %
HCT: 39.2 % (ref 36.0–46.0)
Hemoglobin: 12.9 g/dL (ref 12.0–15.0)
Immature Granulocytes: 1 %
Lymphocytes Relative: 16 %
Lymphs Abs: 1.4 10*3/uL (ref 0.7–4.0)
MCH: 33.4 pg (ref 26.0–34.0)
MCHC: 32.9 g/dL (ref 30.0–36.0)
MCV: 101.6 fL — ABNORMAL HIGH (ref 80.0–100.0)
Monocytes Absolute: 1 10*3/uL (ref 0.1–1.0)
Monocytes Relative: 12 %
Neutro Abs: 5.9 10*3/uL (ref 1.7–7.7)
Neutrophils Relative %: 68 %
Platelets: 192 10*3/uL (ref 150–400)
RBC: 3.86 MIL/uL — ABNORMAL LOW (ref 3.87–5.11)
RDW: 12.4 % (ref 11.5–15.5)
WBC: 8.5 10*3/uL (ref 4.0–10.5)
nRBC: 0 % (ref 0.0–0.2)

## 2020-09-08 LAB — COMPREHENSIVE METABOLIC PANEL
ALT: 13 U/L (ref 0–44)
AST: 17 U/L (ref 15–41)
Albumin: 3.2 g/dL — ABNORMAL LOW (ref 3.5–5.0)
Alkaline Phosphatase: 44 U/L (ref 38–126)
Anion gap: 8 (ref 5–15)
BUN: 14 mg/dL (ref 8–23)
CO2: 26 mmol/L (ref 22–32)
Calcium: 8.8 mg/dL — ABNORMAL LOW (ref 8.9–10.3)
Chloride: 103 mmol/L (ref 98–111)
Creatinine, Ser: 0.61 mg/dL (ref 0.44–1.00)
GFR, Estimated: >60 mL/min (ref >60)
Glucose, Bld: 100 mg/dL — ABNORMAL HIGH (ref 70–99)
Potassium: 3.5 mmol/L (ref 3.5–5.1)
Sodium: 137 mmol/L (ref 135–145)
Total Bilirubin: 0.6 mg/dL (ref 0.3–1.2)
Total Protein: 6.3 g/dL — ABNORMAL LOW (ref 6.5–8.1)

## 2020-09-08 LAB — BLOOD CULTURE ID PANEL (REFLEXED) - BCID2

## 2020-09-08 LAB — FERRITIN: Ferritin: 117 ng/mL (ref 11–307)

## 2020-09-08 LAB — C-REACTIVE PROTEIN: CRP: 7.4 mg/dL — ABNORMAL HIGH (ref <1.0)

## 2020-09-08 LAB — D-DIMER, QUANTITATIVE: D-Dimer, Quant: 0.47 ug/mL-FEU (ref 0.00–0.50)

## 2020-09-08 LAB — PHOSPHORUS: Phosphorus: 2 mg/dL — ABNORMAL LOW (ref 2.5–4.6)

## 2020-09-08 LAB — MAGNESIUM: Magnesium: 2.2 mg/dL (ref 1.7–2.4)

## 2020-09-08 MED ORDER — SODIUM CHLORIDE 0.9 % IV SOLN
1.0000 g | Freq: Two times a day (BID) | INTRAVENOUS | Status: DC
  Administered 2020-09-08 – 2020-09-10 (×4): 1 g via INTRAVENOUS
  Filled 2020-09-08 (×4): qty 1

## 2020-09-08 MED ORDER — ENOXAPARIN SODIUM 60 MG/0.6ML IJ SOSY
60.0000 mg | PREFILLED_SYRINGE | Freq: Two times a day (BID) | INTRAMUSCULAR | Status: DC
  Administered 2020-09-08 – 2020-09-09 (×2): 60 mg via SUBCUTANEOUS
  Filled 2020-09-08 (×2): qty 0.6

## 2020-09-08 MED ORDER — POTASSIUM PHOSPHATES 15 MMOLE/5ML IV SOLN
30.0000 mmol | Freq: Once | INTRAVENOUS | Status: AC
  Administered 2020-09-08: 30 mmol via INTRAVENOUS
  Filled 2020-09-08: qty 10

## 2020-09-08 NOTE — Evaluation (Signed)
Clinical/Bedside Swallow Evaluation Patient Details  Name: Jaclyn Weaver MRN: [id number] Date of Birth: [birth year]  Today's Date: 09/08/2020 Time: SLP Start Time (ACUTE ONLY): 1535 SLP Stop Time (ACUTE ONLY): 1551 SLP Time Calculation (min) (ACUTE ONLY): 16 min  Past Medical History:  Past Medical History:  Diagnosis Date   Constipation    from facility paperwork   COPD (chronic obstructive pulmonary disease) (HCC)    Dementia (HCC)    from facility paperwork   Dermatitis    from facility paperwork   Hyperlipidemia    from facility paperwork   Hypertension    Hypothyroidism    Osteoporosis    from facility paperwork   Urinary incontinence    from facility paperwork   Vascular degeneration    Pt is nonverbal due to this   Past Surgical History:  Past Surgical History:  Procedure Laterality Date   CYSTOSCOPY W/ URETERAL STENT PLACEMENT Left 02/02/2020   Procedure: CYSTOSCOPY WITH RETROGRADE PYELOGRAM/URETERAL STENT PLACEMENT;  Surgeon: Jannifer Hick, MD;  Location: [facility];  Service: Urology;  Laterality: Left;   CYSTOSCOPY WITH RETROGRADE PYELOGRAM, URETEROSCOPY AND STENT PLACEMENT Left 02/09/2020   Procedure: CYSTOSCOPY WITH RETROGRADE PYELOGRAM, URETEROSCOPY AND STENT REPLACEMENT;  Surgeon: Sebastian Ache, MD;  Location: [facility];  Service: Urology;  Laterality: Left;  75 MINS   HOLMIUM LASER APPLICATION Left 02/09/2020   Procedure: HOLMIUM LASER APPLICATION;  Surgeon: Sebastian Ache, MD;  Location: [facility];  Service: Urology;  Laterality: Left;   NO PAST SURGERIES     HPI:  Patient is an 87 year old female presenting from SNF with not eating for the past 2 days, somnolence, and with fever. Patient diagnosed with COVID. PMH includes steroid-dependent COPD, dementia, and history of DVT.  BSE completed on 02/02/20 with recommendations for Dysphagia 3 solids and thin liquids with no ST follow up.   Assessment / Plan / Recommendation Clinical Impression  Pt was seen for a bedside  swallow evaluation and she presents with suspected oropharyngeal dysphagia.  Pt was encountered asleep in bed, but she roused to minimal tactile and verbal stimulation.  Daughter was present at bedside and reported that the patient consumes soft solids and thickened liquids at baseline at nursing facility.  Daughter was unsure as to if the patient consumes nectar-thick liquid or honey-thick liquid.  Unable to complete oral mechanism evaluation secondary to inability to follow commands in the setting of advanced dementia.  Pt consumed trials of thin liquid, nectar-thick liquid, honey-thick liquid, puree, and softened solids.  Pt exhibited a delayed throat clear and audible swallow with nectar-thick liquid, and an audible swallow with thin liquid.  Pt with what appeared to be minimal mastication of softened solid with audible swallow, followed by a prolonged, delayed cough.  Cough was noted to be congested, but strong.  No overt s/sx of aspiration observed with honey-thick liquid or puree.  Recommend diet change to Dysphagia 1 (puree) solids and honey-thick liquids with medication administered crushed in puree.  Pt will require full assistance for all PO intake.  SLP will f/u to monitor diet tolerance and advance as appropriate.  SLP Visit Diagnosis: Dysphagia, oropharyngeal phase (R13.12)    Aspiration Risk  Moderate aspiration risk    Diet Recommendation Honey-thick liquid;Dysphagia 1 (Puree)   Liquid Administration via: Cup;Straw Medication Administration: Crushed with puree Supervision: Staff to assist with self feeding;Full supervision/cueing for compensatory strategies Compensations: Minimize environmental distractions;Slow rate;Small sips/bites Postural Changes: Seated upright at 90 degrees    Other  Recommendations  Oral Care Recommendations: Oral care BID;Staff/trained caregiver to provide oral care Other Recommendations: Order thickener from pharmacy;Have oral suction available;Remove water  pitcher   Follow up Recommendations 24 hour supervision/assistance      Frequency and Duration min 2x/week  2 weeks       Prognosis Prognosis for Safe Diet Advancement: Fair Barriers to Reach Goals: Cognitive deficits      Swallow Study   General HPI: Patient is an 87 year old female presenting from SNF with not eating for the past 2 days, somnolence, and with fever. Patient diagnosed with COVID. PMH includes steroid-dependent COPD, dementia, and history of DVT.  BSE completed on 02/02/20 with recommendations for Dysphagia 3 solids and thin liquids with no ST follow up. Type of Study: Bedside Swallow Evaluation Previous Swallow Assessment: See HPI Diet Prior to this Study: Dysphagia 3 (soft);Thin liquids Temperature Spikes Noted: No Respiratory Status: Room air History of Recent Intubation: No Behavior/Cognition: Alert;Cooperative Oral Cavity Assessment: Other (comment) (Unable to evaluate) Oral Care Completed by SLP: No Oral Cavity - Dentition: Adequate natural dentition (per daughter report) Self-Feeding Abilities: Total assist Patient Positioning: Upright in bed Baseline Vocal Quality: Not observed Volitional Cough: Strong;Congested Volitional Swallow: Unable to elicit    Oral/Motor/Sensory Function Overall Oral Motor/Sensory Function: Other (comment) (unable to evaluate)   Ice Chips Ice chips: Not tested   Thin Liquid Thin Liquid: Impaired Presentation: Straw Pharyngeal  Phase Impairments: Suspected delayed Swallow (Audible swallow)    Nectar Thick Nectar Thick Liquid: Impaired Presentation: Straw Pharyngeal Phase Impairments: Throat Clearing - Delayed;Suspected delayed Swallow (Audible swallow)   Honey Thick Honey Thick Liquid: Within functional limits Presentation: Straw   Puree Puree: Within functional limits Presentation: Spoon   Solid     Solid: Impaired Presentation: Spoon Oral Phase Impairments: Impaired mastication Oral Phase Functional Implications:  Prolonged oral transit;Impaired mastication Pharyngeal Phase Impairments: Cough - Delayed (audible swallow)     Villa Herb., M.S., CCC-SLP Acute Rehabilitation Services Office: [phone]  Shanon Rosser [facility] 09/08/2020,4:03 PM

## 2020-09-08 NOTE — Progress Notes (Signed)
PHARMACY - PHYSICIAN COMMUNICATION CRITICAL VALUE ALERT - BLOOD CULTURE IDENTIFICATION (BCID)  Jaclyn Weaver is an 87 y.o. female who presented to [facility] on 09/06/2020 with a chief complaint of fever, constipation & altered mental status  Assessment:  1/4 bottles (anaerobic) is showing staph species- suspect contaminant  Name of physician (or Provider) ContactedAlanda Slim via Secure chat  Current antibiotics: cefepime> aztreonam for UTI  Changes to prescribed antibiotics recommended:  No treatment needed> suspected contaminant  Results for orders placed or performed during the hospital encounter of 09/06/20  Blood Culture ID Panel (Reflexed) (Collected: 09/07/2020  8:21 AM)  Result Value Ref Range   Enterococcus faecalis NOT DETECTED NOT DETECTED   Enterococcus Faecium NOT DETECTED NOT DETECTED   Listeria monocytogenes NOT DETECTED NOT DETECTED   Staphylococcus species DETECTED (A) NOT DETECTED   Staphylococcus aureus (BCID) NOT DETECTED NOT DETECTED   Staphylococcus epidermidis NOT DETECTED NOT DETECTED   Staphylococcus lugdunensis NOT DETECTED NOT DETECTED   Streptococcus species NOT DETECTED NOT DETECTED   Streptococcus agalactiae NOT DETECTED NOT DETECTED   Streptococcus pneumoniae NOT DETECTED NOT DETECTED   Streptococcus pyogenes NOT DETECTED NOT DETECTED   A.calcoaceticus-baumannii NOT DETECTED NOT DETECTED   Bacteroides fragilis NOT DETECTED NOT DETECTED   Enterobacterales NOT DETECTED NOT DETECTED   Enterobacter cloacae complex NOT DETECTED NOT DETECTED   Escherichia coli NOT DETECTED NOT DETECTED   Klebsiella aerogenes NOT DETECTED NOT DETECTED   Klebsiella oxytoca NOT DETECTED NOT DETECTED   Klebsiella pneumoniae NOT DETECTED NOT DETECTED   Proteus species NOT DETECTED NOT DETECTED   Salmonella species NOT DETECTED NOT DETECTED   Serratia marcescens NOT DETECTED NOT DETECTED   Haemophilus influenzae NOT DETECTED NOT DETECTED   Neisseria meningitidis NOT DETECTED  NOT DETECTED   Pseudomonas aeruginosa NOT DETECTED NOT DETECTED   Stenotrophomonas maltophilia NOT DETECTED NOT DETECTED   Candida albicans NOT DETECTED NOT DETECTED   Candida auris NOT DETECTED NOT DETECTED   Candida glabrata NOT DETECTED NOT DETECTED   Candida krusei NOT DETECTED NOT DETECTED   Candida parapsilosis NOT DETECTED NOT DETECTED   Candida tropicalis NOT DETECTED NOT DETECTED   Cryptococcus neoformans/gattii NOT DETECTED NOT DETECTED    Herby Abraham, Pharm.D 09/08/2020 2:22 PM

## 2020-09-08 NOTE — Evaluation (Signed)
Occupational Therapy Evaluation Patient Details Name: Jaclyn Weaver MRN: [id number] DOB: 06-18-1930 Today's Date: 09/08/2020    History of Present Illness Patient is an 87 year old female presenting from SNF with not eating for the past 2 days, somnolence, and with fever. Patient diagnosed with COVID. PMH includes steroid-dependent COPD, dementia, and history of DVT   Clinical Impression   Patient from [facility] memory care unit. Per nursing patient is non verbal at baseline, per chart review uses wheelchair for mobility. Patient following ~10% of multimodal cues to minimally wash face then goes back to picking at her gown. Does not initiate any mobility when prompted needing total A x2 for bed mobility and sit to stand from edge of bed with strong posterior lean. Unfortunately patient unable to participate in skilled OT services, will need 24/7 assistance for self care with return to care facility. Acute OT to sign off.     Follow Up Recommendations  Other (comment) (return to SNF)    Equipment Recommendations  None recommended by OT       Precautions / Restrictions Precautions Precautions: Fall Restrictions Weight Bearing Restrictions: No      Mobility Bed Mobility Overal bed mobility: Needs Assistance Bed Mobility: Supine to Sit;Sit to Supine     Supine to sit: Total assist Sit to supine: Total assist;+2 for physical assistance   General bed mobility comments: patient does not initiate with multimodal cues. use of bed pad to sit patient upright. needing total assist to guide trunk and lift legs onto bed    Transfers Overall transfer level: Needs assistance Equipment used: Rolling walker (2 wheeled) Transfers: Sit to/from Stand Sit to Stand: Total assist;+2 physical assistance         General transfer comment: performed sit to stand twice from edge of bed. needing assist to keep legs from sliding forward with strong posterior lean. does not attempt to assist  either time.    Balance Overall balance assessment: Needs assistance Sitting-balance support: Feet supported Sitting balance-Leahy Scale: Zero Sitting balance - Comments: reliant on external support   Standing balance support: Bilateral upper extremity supported Standing balance-Leahy Scale: Zero Standing balance comment: total A x2                           ADL either performed or assessed with clinical judgement   ADL                                         General ADL Comments: unsure of patient's baseline at care facility however patient with advanced dementia and wheel chair dependent. anticipate patient needing significant assist at baseline. With max multimodal cues patient washing ~10% of face with wash cloth. Otherwise does not initiate tasks when prompted                         Pertinent Vitals/Pain Pain Assessment: Faces Faces Pain Scale: No hurt     Hand Dominance  (did not specify)   Extremity/Trunk Assessment Upper Extremity Assessment Upper Extremity Assessment: Generalized weakness   Lower Extremity Assessment Lower Extremity Assessment: Defer to PT evaluation   Cervical / Trunk Assessment Cervical / Trunk Assessment: Kyphotic   Communication Communication Communication: Expressive difficulties (non verbal)   Cognition Arousal/Alertness: Awake/alert Behavior During Therapy: Flat affect Overall Cognitive Status: History of cognitive  impairments - at baseline                                 General Comments: following ~10% of multimodal cues/directions   General Comments  O2 reading 94% on room air at beginning of session, HR 86            Home Living Family/patient expects to be discharged to:: Skilled nursing facility                                        Prior Functioning/Environment          Comments: patient unable to provide history. Per nursing patient is non verbal  at baseline. Per chart review patient w/c dependent        OT Problem List: Cardiopulmonary status limiting activity         OT Goals(Current goals can be found in the care plan section) Acute Rehab OT Goals Patient Stated Goal: unable OT Goal Formulation: Patient unable to participate in goal setting             Co-evaluation PT/OT/SLP Co-Evaluation/Treatment: Yes Reason for Co-Treatment: For patient/therapist safety;To address functional/ADL transfers PT goals addressed during session: Mobility/safety with mobility OT goals addressed during session: ADL's and self-care      AM-PAC OT "6 Clicks" Daily Activity     Outcome Measure Help from another person eating meals?: Total Help from another person taking care of personal grooming?: Total Help from another person toileting, which includes using toliet, bedpan, or urinal?: Total Help from another person bathing (including washing, rinsing, drying)?: Total Help from another person to put on and taking off regular upper body clothing?: Total Help from another person to put on and taking off regular lower body clothing?: Total 6 Click Score: 6   End of Session Equipment Utilized During Treatment: Rolling walker Nurse Communication: Mobility status  Activity Tolerance:  (Treatment limited secondary cognition) Patient left: in bed;with call bell/phone within reach;with bed alarm set  OT Visit Diagnosis: Muscle weakness (generalized) (M62.81)                Time: 8295-6213 OT Time Calculation (min): 24 min Charges:  OT General Charges $OT Visit: 1 Visit OT Evaluation $OT Eval Low Complexity: 1 Low  [name] OT OT pager: [phone]  [name] 09/08/2020, 12:47 PM

## 2020-09-08 NOTE — Progress Notes (Signed)
ANTICOAGULATION CONSULT NOTE - Initial Consult  Pharmacy Consult for Lovenox Indication: DVT  Allergies  Allergen Reactions   Penicillin G Shortness Of Breath   Penicillins     Patient Measurements: Height: 5\' 4"  (162.6 cm) Weight: 57.2 kg (126 lb) IBW/kg (Calculated) : 54.7 Heparin Dosing Weight: 57.2 kg  Vital Signs: Temp: 97.9 F (36.6 C) (08/14 1220) Temp Source: Oral (08/14 1220) BP: 129/70 (08/14 1220) Pulse Rate: 77 (08/14 1220)  Labs: Recent Labs    09/06/20 2100 09/06/20 2203 09/08/20 0407  HGB 15.6* 12.6 12.9  HCT 47.6* 37.0 39.2  PLT 175  --  192  APTT 29  --   --   LABPROT 14.3  --   --   INR 1.1  --   --   CREATININE 0.75 0.70 0.61    Estimated Creatinine Clearance: 42.8 mL/min (by C-G formula based on SCr of 0.61 mg/dL).   Medical History: Past Medical History:  Diagnosis Date   Constipation    from facility paperwork   COPD (chronic obstructive pulmonary disease) (HCC)    Dementia (HCC)    from facility paperwork   Dermatitis    from facility paperwork   Hyperlipidemia    from facility paperwork   Hypertension    Hypothyroidism    Osteoporosis    from facility paperwork   Urinary incontinence    from facility paperwork   Vascular degeneration    Pt is nonverbal due to this    Medications:  Medications Prior to Admission  Medication Sig Dispense Refill Last Dose   acetaminophen (TYLENOL) 325 MG tablet Take 650 mg by mouth every 4 (four) hours as needed (for pain or a temperature greater than 100 F).   09/06/2020 at 0830   alendronate-cholecalciferol (FOSAMAX PLUS D) 70-2800 MG-UNIT tablet Take 1 tablet by mouth every 7 (seven) days. Take with a full glass of water on an empty stomach.   09/01/2020 at 0600   apixaban (ELIQUIS) 5 MG TABS tablet Take 5 mg by mouth 2 (two) times daily.   09/06/2020   fluticasone-salmeterol (ADVAIR HFA) 45-21 MCG/ACT inhaler Inhale 1 puff into the lungs 2 (two) times daily.   09/06/2020   ipratropium  (ATROVENT) 0.06 % nasal spray Place 2 sprays into both nostrils in the morning and at bedtime.   09/06/2020   levothyroxine (SYNTHROID) 75 MCG tablet Take 75 mcg by mouth daily before breakfast.   09/06/2020 at unk   Multiple Vitamins-Minerals (MULTIVITAMIN WITH MINERALS) tablet Take 1 tablet by mouth daily.   09/06/2020 at unk   predniSONE (DELTASONE) 5 MG tablet Take 5 mg by mouth daily with breakfast.   09/06/2020 at unk   senna (SENOKOT) 8.6 MG TABS tablet Take 1 tablet by mouth daily.   09/05/2020 at unk   simvastatin (ZOCOR) 10 MG tablet Take 10 mg by mouth every other day.   09/06/2020 at unk    Assessment: 87 yo F on apixaban 5 mg po BID PTA for hx DVT. Pharmacy consulted to change to [facility] treatment dose because patient unable to take PO dose of apixaban this morning. Last dose 8/13 PM. She has been on apixaban 5 mg po bid since 03/26/2020.  CBC WNL, SCr WNL, no bleeding reported. Pt + for Covid. D dimer 0.47  Goal of Therapy:  Anti-Xa level 0.6-1 units/ml 4hrs after LMWH dose given Monitor platelets by anticoagulation protocol: Yes   Plan:  Lovenox 1 mg/kg SQ q12 hr = 60 q12 for treatment dose [facility]  Consider reducing to prophylactic dose as appears she has been on treatment dose ~ 5.5 months F/u CBC, renal function  Herby Abraham, Pharm.D 09/08/2020 2:04 PM

## 2020-09-08 NOTE — Progress Notes (Addendum)
PROGRESS NOTE  Jaclyn Weaver YIR:[id number] DOB: 1930-10-26   PCP: Soundra Pilon, FNP  Patient is from: [facility] memory unit.  "At baseline, she reportedly answers some yes or no questions intermittently but has not been talking and seemed to not recognize her daughter today"  DOA: 09/06/2020 LOS: 0  Chief complaints:  Fatigue, decreased oral intake and fever    Brief Narrative / Interim history: 87 year old F with PMH of severe dementia, steroid-dependent COPD, DVT on Eliquis, dysphagia, HTN, HLD and urinary incontinence brought to ED with concern for fever, decreased oral intake and decreased mental status, and admitted for acute encephalopathy in the setting of COVID-19 infection and possible UTI.  She was febrile to 38.3 C but no leukocytosis or lactic acidosis.  COVID-19 PCR positive.  CT head negative.  CT a/p without acute finding.  UA concerning for UTI.  Cultures obtained.  Started on IV cefepime given history of Pseudomonas UTI.   Subjective: Seen and examined earlier this morning.  No major events overnight of this morning.  She is very sleepy this morning.  She woke up to physical stimulation but not interacting or talking.  Does not appear to be in distress.  She is on room air.  Objective: Vitals:   09/07/20 2031 09/07/20 2326 09/08/20 0412 09/08/20 1220  BP: (!) 154/77 (!) 155/75 (!) 147/72 129/70  Pulse: 92 94 81 77  Resp: 20 18 18  (!) 22  Temp: 98.5 F (36.9 C) 98.4 F (36.9 C) 98.8 F (37.1 C) 97.9 F (36.6 C)  TempSrc: Oral Oral Axillary Oral  SpO2: 97% 99% 97% 97%  Weight:      Height:        Intake/Output Summary (Last 24 hours) at 09/08/2020 1239 Last data filed at 09/08/2020 1100 Gross per 24 hour  Intake 570 ml  Output 900 ml  Net -330 ml   Filed Weights   09/07/20 1732  Weight: 57.2 kg    Examination:  GENERAL: No apparent distress.  Nontoxic. HEENT: MMM.  PERRL. NECK: Supple.  No apparent JVD.  RESP: On RA.  No IWOB.  Fair  aeration bilaterally. CVS:  RRR. Heart sounds normal.  ABD/GI/GU: BS+. Abd soft, NTND.  MSK/EXT:  Moves extremities. No apparent deformity. No edema.  SKIN: Small skin bruises/ecchymosis in both lower extremities. NEURO: Sleepy.  Woke up to physical stimulation but not interacting.  Does not follows command.  No apparent focal neuro deficit. PSYCH: Calm. Normal affect.   Procedures:  None  Microbiology summarized: 8/12-COVID-19 PCR positive. 8/12-blood cultures with GPC's in 1 out of 4 bottles. 8/12-urine cultures pending  Assessment & Plan: Acute metabolic encephalopathy-per daughter she answer yes or or no questions at baseline.  CT head without acute finding.  No apparent focal neurodeficit but limited exam due to body habitus.  Encephalopathy could be due to COVID-19 and possible UTI.  She is more somnolent and lethargic today. -Delirium, aspiration and fall precautions precautions -Change IV cefepime to IV aztreonam in the setting of encephalopathy.  Allergic to penicillin. -Allow family to stay with patient 24/7 if they can  Possible UTI: Difficult diagnosis to make without history.  She has fever but no leukocytosis.  Pro-Cal and lactic acid negative.  No suprapubic tenderness.  UA concerning for UTI.  Has history of Pseudomonas UTI. -IV antibiotics as above  Abnormal blood culture: 1 out of 4 bottles with GPC likely contaminant but we do not have speciation yet.  Her MRSA PCR screen is positive.  Her Pro-Cal is negative which makes bacteremia less likely. -IV antibiotics as above   COVID-19-could be contributing to his symptoms of fever, anorexia and somnolence.  No respiratory symptoms.  CXR clear. -Continue remdesivir for 3 days-started in ED -She is already on low dose prednisone for COPD   Chronic COPD: Stable.  100% on RA. -Continue low-dose prednisone, ICS/LABA, as needed SABA     Hx of VTE/DVT on Eliquis -Change Eliquis to Lovenox.  Unable to take p.o. safely    Hypothyroidism -Continue home Synthroid.  May have to change to IV if not able to take p.o.   Severe dementia without behavioral disturbance: See encephalopathy above.  Debility/generalized weakness-wheelchair dependent but able to maneuver her wheelchair at baseline -PT/OT eval  Decreased oral intake/dysphagia-on pure diet at baseline? -SLP eval -Liberate diet. -Consult dietitian   Body mass index is 21.63 kg/m.         DVT prophylaxis:   apixaban (ELIQUIS) tablet 5 mg  Code Status: [name]/DNI Family Communication: Updated patient's daughter over the phone on 8/13. Level of care: Med-Surg Status is: Observation  The patient will require care spanning > 2 midnights and should be moved to inpatient because: Altered mental status, IV treatments appropriate due to intensity of illness or inability to take PO, and Inpatient level of care appropriate due to severity of illness  Dispo: The patient is from: ALF              Anticipated d/c is to: ALF              Patient currently is not medically stable to d/c.   Difficult to place patient No       Consultants:  None   Sch Meds:  Scheduled Meds:  apixaban  5 mg Oral BID   feeding supplement  237 mL Oral BID BM   levothyroxine  75 mcg Oral Q0600   mometasone-formoterol  2 puff Inhalation BID   pneumococcal 23 valent vaccine  0.5 mL Intramuscular Tomorrow-1000   predniSONE  5 mg Oral Q breakfast   simvastatin  10 mg Oral QODAY   Continuous Infusions:  ceFEPime (MAXIPIME) IV 2 g (09/08/20 1030)   potassium PHOSPHATE IVPB (in mmol)     remdesivir 100 mg in NS 100 mL     PRN Meds:.acetaminophen, albuterol, bisacodyl, ondansetron **OR** ondansetron (ZOFRAN) IV, senna-docusate  Antimicrobials: Anti-infectives (From admission, onward)    Start     Dose/Rate Route Frequency Ordered Stop   09/08/20 1000  remdesivir 100 mg in sodium chloride 0.9 % 100 mL IVPB       See Hyperspace for full Linked Orders Report.   100  mg 200 mL/hr over 30 Minutes Intravenous Daily 09/07/20 0022 09/12/20 0959   09/07/20 0800  ceFEPIme (MAXIPIME) 2 g in sodium chloride 0.9 % 100 mL IVPB        2 g 200 mL/hr over 30 Minutes Intravenous Every 12 hours 09/07/20 0141     09/07/20 0100  remdesivir 200 mg in sodium chloride 0.9% 250 mL IVPB       See Hyperspace for full Linked Orders Report.   200 mg 580 mL/hr over 30 Minutes Intravenous Once 09/07/20 0022 09/07/20 0602   09/06/20 2115  cefTRIAXone (ROCEPHIN) 2 g in sodium chloride 0.9 % 100 mL IVPB        2 g 200 mL/hr over 30 Minutes Intravenous  Once 09/06/20 2100 09/07/20 0037   09/06/20 2115  metroNIDAZOLE (FLAGYL) IVPB 500 mg  Status:  Discontinued        500 mg 100 mL/hr over 60 Minutes Intravenous  Once 09/06/20 2100 09/07/20 0059        I have personally reviewed the following labs and images: CBC: Recent Labs  Lab 09/06/20 2100 09/06/20 2203 09/08/20 0407  WBC 8.4  --  8.5  NEUTROABS 6.2  --  5.9  HGB 15.6* 12.6 12.9  HCT 47.6* 37.0 39.2  MCV 102.8*  --  101.6*  PLT 175  --  192   BMP &GFR Recent Labs  Lab 09/06/20 2100 09/06/20 2203 09/08/20 0407  NA 140 138 137  K 4.1 4.2 3.5  CL 106 106 103  CO2 24  --  26  GLUCOSE 106* 106* 100*  BUN CREATININE 0.75 0.70 0.61  CALCIUM 9.4  --  8.8*  MG  --   --  2.2  PHOS  --   --  2.0*   Estimated Creatinine Clearance: 42.8 mL/min (by C-G formula based on SCr of 0.61 mg/dL). Liver & Pancreas: Recent Labs  Lab 09/06/20 2100 09/08/20 0407  AST 15 17  ALT 14 13  ALKPHOS 46 44  BILITOT 0.6 0.6  PROT 6.6 6.3*  ALBUMIN 3.5 3.2*   No results for input(s): LIPASE, AMYLASE in the last 168 hours. No results for input(s): AMMONIA in the last 168 hours. Diabetic: No results for input(s): HGBA1C in the last 72 hours. No results for input(s): GLUCAP in the last 168 hours. Cardiac Enzymes: No results for input(s): CKTOTAL, CKMB, CKMBINDEX, TROPONINI in the last 168 hours. No results for  input(s): PROBNP in the last 8760 hours. Coagulation Profile: Recent Labs  Lab 09/06/20 2100  INR 1.1   Thyroid Function Tests: No results for input(s): TSH, T4TOTAL, FREET4, T3FREE, THYROIDAB in the last 72 hours. Lipid Profile: No results for input(s): CHOL, HDL, LDLCALC, TRIG, CHOLHDL, LDLDIRECT in the last 72 hours. Anemia Panel: Recent Labs    09/08/20 0407  FERRITIN 117   Urine analysis:    Component Value Date/Time   COLORURINE AMBER (A) 09/06/2020 2100   APPEARANCEUR CLOUDY (A) 09/06/2020 2100   LABSPEC 1.020 09/06/2020 2100   PHURINE 6.0 09/06/2020 2100   GLUCOSEU NEGATIVE 09/06/2020 2100   HGBUR NEGATIVE 09/06/2020 2100   BILIRUBINUR NEGATIVE 09/06/2020 2100   KETONESUR 20 (A) 09/06/2020 2100   PROTEINUR 30 (A) 09/06/2020 2100   NITRITE NEGATIVE 09/06/2020 2100   LEUKOCYTESUR MODERATE (A) 09/06/2020 2100   Sepsis Labs: Invalid input(s): PROCALCITONIN, LACTICIDVEN  Microbiology: Recent Results (from the past 240 hour(s))  Resp Panel by RT-PCR (Flu A&B, Covid) Nasopharyngeal Swab     Status: Abnormal   Collection Time: 09/06/20  8:36 PM   Specimen: Nasopharyngeal Swab; Nasopharyngeal(NP) swabs in vial transport medium  Result Value Ref Range Status   SARS Coronavirus 2 by RT PCR POSITIVE (A) NEGATIVE Final    Comment: RESULT CALLED TO, READ BACK BY AND VERIFIED WITH: JESSICA LAUDERMILK AT 0003 ON 09/07/20 BY MAJ (NOTE) SARS-CoV-2 target nucleic acids are DETECTED.  The SARS-CoV-2 RNA is generally detectable in upper respiratory specimens during the acute phase of infection. Positive results are indicative of the presence of the identified virus, but do not rule out bacterial infection or co-infection with other pathogens not detected by the test. Clinical correlation with patient history and other diagnostic information is necessary to determine patient infection status. The expected result is Negative.  Fact Sheet for  Patients: BloggerCourse.com  Fact Sheet  for Healthcare Providers: SeriousBroker.it  This test is not yet approved or cleared by the [place] and  has been authorized for detection and/or diagnosis of SARS-CoV-2 by FDA under an Emergency Use Authorization (EUA).  This EUA will remain in effect (meaning this  test can be used) for the duration of  the COVID-19 declaration under Section 564(b)(1) of the Act, 21 U.S.C. section 360bbb-3(b)(1), unless the authorization is terminated or revoked sooner.     Influenza A by PCR NEGATIVE NEGATIVE Final   Influenza B by PCR NEGATIVE NEGATIVE Final    Comment: (NOTE) The Xpert Xpress SARS-CoV-2/FLU/RSV plus assay is intended as an aid in the diagnosis of influenza from Nasopharyngeal swab specimens and should not be used as a sole basis for treatment. Nasal washings and aspirates are unacceptable for Xpert Xpress SARS-CoV-2/FLU/RSV testing.  Fact Sheet for Patients: BloggerCourse.com  Fact Sheet for Healthcare Providers: SeriousBroker.it  This test is not yet approved or cleared by the [place] FDA and has been authorized for detection and/or diagnosis of SARS-CoV-2 by FDA under an Emergency Use Authorization (EUA). This EUA will remain in effect (meaning this test can be used) for the duration of the COVID-19 declaration under Section 564(b)(1) of the Act, 21 U.S.C. section 360bbb-3(b)(1), unless the authorization is terminated or revoked.  Performed at [facility], [address], [place], [place] [address]   Blood Culture (routine x 2)     Status: None (Preliminary result)   Collection Time: 09/06/20  9:00 PM   Specimen: BLOOD  Result Value Ref Range Status   Specimen Description   Final    BLOOD BLOOD RIGHT ARM Performed at [facility], [address], [place], [place]  [address]    Special Requests   Final    Blood Culture adequate volume BOTTLES DRAWN AEROBIC AND ANAEROBIC Performed at [facility], [address], [place], [place] [address]    Culture   Final    NO GROWTH 1 DAY Performed at [facility], [address] [place], [place] [address]    Report Status PENDING  Incomplete  Urine Culture     Status: None (Preliminary result)   Collection Time: 09/06/20  9:00 PM   Specimen: In/Out Cath Urine  Result Value Ref Range Status   Specimen Description   Final    IN/OUT CATH URINE Performed at [facility], [address] [place], [place] [address]    Special Requests   Final    NONE Performed at [facility], [address] [place], [place] [address]    Culture   Final    CULTURE REINCUBATED FOR BETTER GROWTH Performed at [facility], [address], [place], [place] [address]    Report Status PENDING  Incomplete  Blood Culture (routine x 2)     Status: None (Preliminary result)   Collection Time: 09/07/20  8:21 AM   Specimen: BLOOD  Result Value Ref Range Status   Specimen Description   Final    BLOOD SITE NOT SPECIFIED Performed at [facility], [address], [place], [place] [address]    Special Requests   Final    BOTTLES DRAWN AEROBIC AND ANAEROBIC Blood Culture adequate volume Performed at [facility], [address], [place], [place] [address]    Culture  Setup Time   Final    GRAM POSITIVE COCCI ANAEROBIC BOTTLE ONLY Organism ID to  follow Performed at [facility], [address] [place], [place] [address]    Culture GRAM POSITIVE COCCI  Final   Report Status PENDING  Incomplete  MRSA Next Gen by PCR, Nasal     Status: Abnormal   Collection Time: 09/07/20  5:41 PM   Specimen: Nasal Mucosa; Nasal Swab  Result Value Ref Range Status   MRSA by PCR Next Gen DETECTED (A) NOT DETECTED Final    Comment: RESULT  CALLED TO, READ BACK BY AND VERIFIED WITH: SOPHIA PICKET AT 1917 ON 09/07/20 BY MAJ (NOTE) The GeneXpert MRSA Assay (FDA approved for NASAL specimens only), is one component of a comprehensive MRSA colonization surveillance program. It is not intended to diagnose MRSA infection nor to guide or monitor treatment for MRSA infections. Test performance is not FDA approved in patients less than 49 years old. Performed at [facility] [address], [place], [place] [address]     Radiology Studies: No results found.    Liani Caris T. Jari Dipasquale [facility]  If 7PM-7AM, please contact night-coverage [email or web address] 09/08/2020, 12:39 PM

## 2020-09-08 NOTE — Evaluation (Signed)
Physical Therapy Evaluation Patient Details Name: Jaclyn Weaver MRN: [id number] DOB: 29-May-1930 Today's Date: 09/08/2020   History of Present Illness  Patient is an 87 year old female presenting from SNF with not eating for the past 2 days, somnolence, and with fever. Patient diagnosed with COVID. PMH includes steroid-dependent COPD, dementia, and history of DVT  Clinical Impression  Patient from [facility] memory care unit. Per nursing patient is non verbal at baseline, per chart review uses wheelchair for mobility. Patient following ~10% of multimodal cues and does not initiate any mobility when prompted needing total A x2 for bed mobility and sit to stand from edge of bed with strong posterior lean. Unfortunately patient unable to participate in skilled PT services, will need 24/7 assistance for self care with return to care facility. Acute PT to sign off.     Follow Up Recommendations SNF    Equipment Recommendations  None recommended by PT    Recommendations for Other Services       Precautions / Restrictions Precautions Precautions: Fall Restrictions Weight Bearing Restrictions: No      Mobility  Bed Mobility Overal bed mobility: Needs Assistance Bed Mobility: Supine to Sit;Sit to Supine     Supine to sit: Total assist Sit to supine: Total assist;+2 for physical assistance   General bed mobility comments: patient does not initiate with multimodal cues. use of bed pad to sit patient upright. needing total assist to guide trunk and lift legs onto bed    Transfers Overall transfer level: Needs assistance Equipment used: Rolling walker (2 wheeled) Transfers: Sit to/from Stand Sit to Stand: Total assist;+2 physical assistance         General transfer comment: performed sit to stand twice from edge of bed. needing assist to keep legs from sliding forward with strong posterior lean. does not attempt to assist either time.  Ambulation/Gait              General Gait Details: Pt is non-ambulatory at this time  Stairs            Wheelchair Mobility    Modified Rankin (Stroke Patients Only)       Balance Overall balance assessment: Needs assistance Sitting-balance support: Feet supported Sitting balance-Leahy Scale: Zero Sitting balance - Comments: reliant on external support   Standing balance support: Bilateral upper extremity supported Standing balance-Leahy Scale: Zero Standing balance comment: total A x2                             Pertinent Vitals/Pain Pain Assessment: Faces Faces Pain Scale: No hurt    Home Living Family/patient expects to be discharged to:: Skilled nursing facility                      Prior Function           Comments: patient unable to provide history. Per nursing patient is non verbal at baseline. Per chart review patient w/c dependent     Hand Dominance   Dominant Hand:  (did not specify)    Extremity/Trunk Assessment   Upper Extremity Assessment Upper Extremity Assessment: Generalized weakness    Lower Extremity Assessment Lower Extremity Assessment: Generalized weakness;Difficult to assess due to impaired cognition    Cervical / Trunk Assessment Cervical / Trunk Assessment: Kyphotic  Communication   Communication: Expressive difficulties  Cognition Arousal/Alertness: Awake/alert Behavior During Therapy: Flat affect Overall Cognitive Status: History of cognitive impairments -  at baseline                                 General Comments: following ~10% of multimodal cues/directions      General Comments General comments (skin integrity, edema, etc.): O2 reading 94% on room air at beginning of session, HR 86    Exercises     Assessment/Plan    PT Assessment Patent does not need any further PT services  PT Problem List         PT Treatment Interventions Functional mobility training;Balance training    PT Goals (Current  goals can be found in the Care Plan section)  Acute Rehab PT Goals Patient Stated Goal: unable    Frequency Min 1X/week   Barriers to discharge        Co-evaluation PT/OT/SLP Co-Evaluation/Treatment: Yes Reason for Co-Treatment: For patient/therapist safety;To address functional/ADL transfers PT goals addressed during session: Mobility/safety with mobility OT goals addressed during session: ADL's and self-care       AM-PAC PT "6 Clicks" Mobility  Outcome Measure Help needed turning from your back to your side while in a flat bed without using bedrails?: Total Help needed moving from lying on your back to sitting on the side of a flat bed without using bedrails?: Total Help needed moving to and from a bed to a chair (including a wheelchair)?: Total Help needed standing up from a chair using your arms (e.g., wheelchair or bedside chair)?: Total Help needed to walk in hospital room?: Total Help needed climbing 3-5 steps with a railing? : Total 6 Click Score: 6    End of Session Equipment Utilized During Treatment: Gait belt Activity Tolerance: Other (comment) Patient left: in bed;with call bell/phone within reach;with nursing/sitter in room Nurse Communication: Mobility status PT Visit Diagnosis: Muscle weakness (generalized) (M62.81);Difficulty in walking, not elsewhere classified (R26.2)    Time: 3557-3220 PT Time Calculation (min) (ACUTE ONLY): 15 min   Charges:   PT Evaluation $PT Eval Low Complexity: 1 Low          Mauro Kaufmann PT Acute Rehabilitation Services Pager [phone] Office [phone]   Ryler Laskowski 09/08/2020, 2:44 PM

## 2020-09-09 LAB — CBC
HCT: 40.1 % (ref 36.0–46.0)
Hemoglobin: 13.2 g/dL (ref 12.0–15.0)
MCH: 32.9 pg (ref 26.0–34.0)
MCHC: 32.9 g/dL (ref 30.0–36.0)
MCV: 100 fL (ref 80.0–100.0)
Platelets: 224 10*3/uL (ref 150–400)
RBC: 4.01 MIL/uL (ref 3.87–5.11)
RDW: 12.4 % (ref 11.5–15.5)
WBC: 5.5 10*3/uL (ref 4.0–10.5)
nRBC: 0 % (ref 0.0–0.2)

## 2020-09-09 LAB — AMMONIA: Ammonia: 18 umol/L (ref 9–35)

## 2020-09-09 LAB — RENAL FUNCTION PANEL
Albumin: 3.3 g/dL — ABNORMAL LOW (ref 3.5–5.0)
Anion gap: 5 (ref 5–15)
BUN: 15 mg/dL (ref 8–23)
CO2: 26 mmol/L (ref 22–32)
Calcium: 8.9 mg/dL (ref 8.9–10.3)
Chloride: 107 mmol/L (ref 98–111)
Creatinine, Ser: 0.59 mg/dL (ref 0.44–1.00)
GFR, Estimated: >60 mL/min (ref >60)
Glucose, Bld: 95 mg/dL (ref 70–99)
Phosphorus: 3.6 mg/dL (ref 2.5–4.6)
Potassium: 3.6 mmol/L (ref 3.5–5.1)
Sodium: 138 mmol/L (ref 135–145)

## 2020-09-09 LAB — MAGNESIUM: Magnesium: 2.4 mg/dL (ref 1.7–2.4)

## 2020-09-09 MED ORDER — POTASSIUM CHLORIDE CRYS ER 10 MEQ PO TBCR: 10.0000 meq | EXTENDED_RELEASE_TABLET | ORAL | Status: DC

## 2020-09-09 MED ORDER — POTASSIUM CHLORIDE 20 MEQ PO PACK
40.0000 meq | PACK | Freq: Once | ORAL | Status: AC
  Administered 2020-09-09: 40 meq via ORAL
  Filled 2020-09-09: qty 2

## 2020-09-09 MED ORDER — MUPIROCIN 2 % EX OINT
1.0000 "application " | TOPICAL_OINTMENT | Freq: Two times a day (BID) | CUTANEOUS | Status: DC
  Administered 2020-09-09 – 2020-09-11 (×5): 1 via NASAL
  Filled 2020-09-09: qty 22

## 2020-09-09 MED ORDER — CHLORHEXIDINE GLUCONATE CLOTH 2 % EX PADS
6.0000 | MEDICATED_PAD | Freq: Every day | CUTANEOUS | Status: DC
  Administered 2020-09-10 – 2020-09-11 (×2): 6 via TOPICAL

## 2020-09-09 MED ORDER — APIXABAN 5 MG PO TABS
5.0000 mg | ORAL_TABLET | Freq: Two times a day (BID) | ORAL | Status: DC
  Administered 2020-09-09 – 2020-09-10 (×3): 5 mg via ORAL
  Filled 2020-09-09 (×5): qty 1

## 2020-09-09 NOTE — Progress Notes (Signed)
Initial Nutrition Assessment  DOCUMENTATION CODES:   Not applicable  INTERVENTION:  - continue Magic Cup TID with meals per protocol, each supplement provides 290 kcal and 9 grams of protein. - continue Ensure Plus BID, each supplement provides 350 kcal and 13 grams of protein.   NUTRITION DIAGNOSIS:   Increased nutrient needs related to acute illness, catabolic illness (COVID-19 infection) as evidenced by estimated needs.  GOAL:   Patient will meet greater than or equal to 90% of their needs  MONITOR:   PO intake, Supplement acceptance, Labs, Weight trends  REASON FOR ASSESSMENT:   Malnutrition Screening Tool  ASSESSMENT:   87 year old female with medical history of severe dementia, steroid-dependent COPD, DVT on Eliquis, dysphagia, HTN, HLD, and urinary incontinence. She presented to the ED with concern for fever, decreased oral intake, and AMS. She was admitted for acute encephalopathy in the setting of COVID-19 infection and possible UTI.  She is noted to be non-verbal. Diet advanced from NPO to Dysphagia 3, honey-thick liquids on 8/13 at 0110 and down graded to Dysphagia 1, honey-thick liquids on 8/14 at 1610. Documented meal intakes were 5% of breakfast and 45% of lunch yesterday and 20% of breakfast today.   Ensure Plus has been ordered BID and she has accepted all 3 bottles she has been offered.   She has not been seen by a [place] RD at any time in the past.   Weight on 8/13 was 126 lb and weight on 01/22/20 was 130 lb. No information documented in the edema section of flow sheet.  Per notes: - acute metabolic encephalopathy  - possible UTI - COVID-19 infection - severe, advanced vascular dementia without behavioral disturbances - debility and generalized weakness  - [name]/DNI   Labs reviewed. Medications reviewed; 75 mcg oral synthroid/day, 40 mEq Klor-Con x1 dose 8/15, 30 mmol IV KPhos x1 run 8/14, 5 mg deltason/day, 100 mg IV remdesivir x2 doses 8/14.     NUTRITION - FOCUSED PHYSICAL EXAM:  Unable to complete at this time.   Diet Order:   Diet Order             DIET - DYS 1 Room service appropriate? Yes; Fluid consistency: Honey Thick  Diet effective now                   EDUCATION NEEDS:   No education needs have been identified at this time  Skin:  Skin Assessment: Reviewed RN Assessment  Last BM:  8/14  Height:   Ht Readings from Last 1 Encounters:  09/07/20 5\' 4"  (1.626 m)    Weight:   Wt Readings from Last 1 Encounters:  09/07/20 57.2 kg      Estimated Nutritional Needs:  Kcal:  1600-1800 kcal Protein:  80-90 grams Fluid:  >/= 2 L/day      09/09/20, MS, RD, LDN, CNSC Inpatient Clinical Dietitian RD pager # available in [facility]  After hours/weekend pager # available in [facility]

## 2020-09-09 NOTE — Progress Notes (Signed)
  Speech Language Pathology Treatment: Dysphagia  Patient Details Name: Jaclyn Weaver MRN: [id number] DOB: February 12, 1930 Today's Date: 09/09/2020 Time: 7628-3151 SLP Time Calculation (min) (ACUTE ONLY): 20 min  Assessment / Plan / Recommendation Clinical Impression  Patient seen to address dysphagia goals with SLP providing skilled assessment of toleration of honey thick liquids. Patient was alert and cooperative but was resistant towards oral care with toothette sponge. Initially, SLP provided total assist with liquid sips but was able to progress to hand over hand assist and then to supervision and minimal tactile cues. Patient exhibited oral and likely pharyngeal delays in swallow initiation and bolus transit. No overt s/s aspiration or penetration observed however patient did exhibit decreased laryngeal pumping and suspected decreased laryngeal elevation. Patient consumed 4 ounces of thickened juice but then declined any solid PO's. SLP did not trial nectar thick liquids secondary to patient's oral and pharyngeal swallow delays.    HPI HPI: Patient is an 87 year old female presenting from SNF with not eating for the past 2 days, somnolence, and with fever. Patient diagnosed with COVID. PMH includes steroid-dependent COPD, dementia, and history of DVT.  BSE completed on 02/02/20 with recommendations for Dysphagia 3 solids and thin liquids with no ST follow up.      SLP Plan  Continue with current plan of care       Recommendations  Diet recommendations: Dysphagia 1 (puree);Honey-thick liquid Liquids provided via: Cup Medication Administration: Crushed with puree Supervision: Full supervision/cueing for compensatory strategies;Staff to assist with self feeding Compensations: Minimize environmental distractions;Slow rate;Small sips/bites Postural Changes and/or Swallow Maneuvers: Seated upright 90 degrees                Oral Care Recommendations: Oral care BID;Staff/trained caregiver  to provide oral care Follow up Recommendations: 24 hour supervision/assistance;Skilled Nursing facility SLP Visit Diagnosis: Dysphagia, oropharyngeal phase (R13.12) Plan: Continue with current plan of care       GO             Angela Nevin, MA, CCC-SLP Speech Therapy [facility]

## 2020-09-09 NOTE — Progress Notes (Signed)
PROGRESS NOTE  Jaclyn Weaver WEX:[id number] DOB: 1930/03/11   PCP: Soundra Pilon, FNP  Patient is from: [facility] memory unit.  "At baseline, she reportedly answers some yes or no questions intermittently but has not been talking and seemed to not recognize her daughter today"  DOA: 09/06/2020 LOS: 1  Chief complaints:  Fatigue, decreased oral intake and fever    Brief Narrative / Interim history: 87 year old F with PMH of severe dementia, steroid-dependent COPD, DVT on Eliquis, dysphagia, HTN, HLD and urinary incontinence brought to ED with concern for fever, decreased oral intake and decreased mental status, and admitted for acute encephalopathy in the setting of COVID-19 infection and possible UTI.  She was febrile to 38.3 C but no leukocytosis or lactic acidosis.  COVID-19 PCR positive.  CT head negative.  CT a/p without acute finding.  UA concerning for UTI.  Cultures obtained.  Started on IV cefepime given history of Pseudomonas UTI.  Cefepime changed to aztreonam in the setting of encephalopathy pending urine cultures.  Subjective: Seen and examined earlier this morning.  No major events overnight of this morning.  Lying in bed.  Does not talk or follow commands.  Tracking me with her eyes in the room.  Does not appear to be in distress.  Per RN, she was screaming at times when the staff leaves the room but not agitated or combative.  Objective: Vitals:   09/08/20 1220 09/08/20 1436 09/08/20 2211 09/09/20 1309  BP: 129/70 (!) 147/87 (!) 145/70 (!) 153/82  Pulse: 77 85 78 80  Resp: (!) 22 16 19 16   Temp: 97.9 F (36.6 C) 97.9 F (36.6 C) 98.1 F (36.7 C) 98.1 F (36.7 C)  TempSrc: Oral Oral Oral Oral  SpO2: 97% 97%  97%  Weight:      Height:        Intake/Output Summary (Last 24 hours) at 09/09/2020 1350 Last data filed at 09/09/2020 0900 Gross per 24 hour  Intake 320 ml  Output 750 ml  Net -430 ml   Filed Weights   09/07/20 1732  Weight: 57.2 kg     Examination:  GENERAL: No apparent distress.  Nontoxic. HEENT: MMM.  PERRL.  Vision grossly intact. NECK: Supple.  No apparent JVD.  RESP: On RA.  No IWOB.  Fair aeration bilaterally. CVS:  RRR. Heart sounds normal.  ABD/GI/GU: BS+. Abd soft, NTND.  MSK/EXT:  Moves extremities. No apparent deformity. No edema.  SKIN: Small skin bruises/ecchymosis in BLE. NEURO: Awake.  She does not talk or follow command.  No apparent focal neurodeficit. PSYCH: Calm.  No distress or agitation.  Procedures:  None  Microbiology summarized: 8/12-COVID-19 PCR positive. 8/12-blood cultures with GPC's in 1 out of 4 bottles. 8/12-urine cultures pending  Assessment & Plan: Acute metabolic encephalopathy-per daughter she answer yes or or no questions at baseline.  CT head without acute finding.  No apparent focal neurodeficit but limited exam due to body habitus.  Encephalopathy could be due to COVID-19 and possible UTI.  She is awake but does not talk or follows command. -Changed IV cefepime to IV aztreonam in the setting of encephalopathy.  Allergic to penicillin.  -Allow family to stay with patient 24/7 if they can -Delirium, aspiration and fall precautions precautions  Possible UTI: Difficult diagnosis to make without history.  She has fever but no leukocytosis.  Pro-Cal and lactic acid negative.  No suprapubic tenderness.  UA concerning for UTI.  Has history of Pseudomonas UTI. -IV antibiotics as above -  Follow urine cultures  Abnormal blood culture: 1 out of 4 bottles with GPC likely contaminant but we do not have speciation yet.  Her MRSA PCR screen is positive.  Her Pro-Cal is negative which makes bacteremia less likely. -IV antibiotics as above   COVID-19-could be contributing to his symptoms of fever, anorexia and somnolence.  No respiratory symptoms.  CXR clear. -Continue remdesivir for 3 days-started in ED -She is already on low dose prednisone for COPD   Chronic COPD: Stable.  100% on  RA. -Continue low-dose prednisone, ICS/LABA, as needed SABA     Hx of DVT on Eliquis-high risk for VTE due to prior history of DVT, COVID-19 infection and immobility -Change Lovenox to home Eliquis now she is awake and can take p.o.   Hypothyroidism -Continue home Synthroid.    Severe vascular dementia without behavioral disturbance: See encephalopathy above.  Patient's daughter is not in agreement with the diagnosis of severe dementia  Debility/generalized weakness-wheelchair dependent but able to maneuver her wheelchair at baseline -PT/OT eval  Decreased oral intake/dysphagia -On dysphagia 1 diet per SLP.  Goal of care counseling: Patient with severe dementia although patient's daughter does not seem to agree with the severity.  Appropriately [name]/DNI.   -Offered palliative care consult, and daughter in agreement.   Body mass index is 21.63 kg/m.         DVT prophylaxis:  apixaban (ELIQUIS) tablet 5 mg  Code Status: [name]/DNI Family Communication: Updated patient's daughter over the phone Level of care: Med-Surg Status is: Inpatient  Remains inpatient appropriate because:Altered mental status, IV treatments appropriate due to intensity of illness or inability to take PO, and Inpatient level of care appropriate due to severity of illness  Dispo: The patient is from: ALF              Anticipated d/c is to: ALF              Patient currently is not medically stable to d/c.   Difficult to place patient No            Consultants:  None   Sch Meds:  Scheduled Meds:  apixaban  5 mg Oral BID   [START ON 09/10/2020] Chlorhexidine Gluconate Cloth  6 each Topical Q0600   feeding supplement  237 mL Oral BID BM   levothyroxine  75 mcg Oral Q0600   mometasone-formoterol  2 puff Inhalation BID   mupirocin ointment  1 application Nasal BID   pneumococcal 23 valent vaccine  0.5 mL Intramuscular Tomorrow-1000   predniSONE  5 mg Oral Q breakfast   simvastatin  10 mg Oral  QODAY   Continuous Infusions:  aztreonam 1 g (09/09/20 1013)   PRN Meds:.acetaminophen, albuterol, bisacodyl, ondansetron **OR** ondansetron (ZOFRAN) IV, senna-docusate  Antimicrobials: Anti-infectives (From admission, onward)    Start     Dose/Rate Route Frequency Ordered Stop   09/08/20 2200  aztreonam (AZACTAM) 1 g in sodium chloride 0.9 % 100 mL IVPB        1 g 200 mL/hr over 30 Minutes Intravenous Every 12 hours 09/08/20 1401 09/12/20 2159   09/08/20 1000  remdesivir 100 mg in sodium chloride 0.9 % 100 mL IVPB       See Hyperspace for full Linked Orders Report.   100 mg 200 mL/hr over 30 Minutes Intravenous Daily 09/07/20 0022 09/09/20 1140   09/07/20 0800  ceFEPIme (MAXIPIME) 2 g in sodium chloride 0.9 % 100 mL IVPB  Status:  Discontinued  2 g 200 mL/hr over 30 Minutes Intravenous Every 12 hours 09/07/20 0141 09/08/20 1254   09/07/20 0100  remdesivir 200 mg in sodium chloride 0.9% 250 mL IVPB       See Hyperspace for full Linked Orders Report.   200 mg 580 mL/hr over 30 Minutes Intravenous Once 09/07/20 0022 09/07/20 0602   09/06/20 2115  cefTRIAXone (ROCEPHIN) 2 g in sodium chloride 0.9 % 100 mL IVPB        2 g 200 mL/hr over 30 Minutes Intravenous  Once 09/06/20 2100 09/07/20 0037   09/06/20 2115  metroNIDAZOLE (FLAGYL) IVPB 500 mg  Status:  Discontinued        500 mg 100 mL/hr over 60 Minutes Intravenous  Once 09/06/20 2100 09/07/20 0059        I have personally reviewed the following labs and images: CBC: Recent Labs  Lab 09/06/20 2100 09/06/20 2203 09/08/20 0407 09/09/20 0342  WBC 8.4  --  8.5 5.5  NEUTROABS 6.2  --  5.9  --   HGB 15.6* 12.6 12.9 13.2  HCT 47.6* 37.0 39.2 40.1  MCV 102.8*  --  101.6* 100.0  PLT 175  --  192 224   BMP &GFR Recent Labs  Lab 09/06/20 2100 09/06/20 2203 09/08/20 0407 09/09/20 0342  NA 140 138 137 138  K 4.1 4.2 3.5 3.6  CL 106 106 103 107  CO2 24  --  26 26  GLUCOSE 106* 106* 100* 95  BUN CREATININE 0.75 0.70 0.61 0.59  CALCIUM 9.4  --  8.8* 8.9  MG  --   --  2.2 2.4  PHOS  --   --  2.0* 3.6   Estimated Creatinine Clearance: 42.8 mL/min (by C-G formula based on SCr of 0.59 mg/dL). Liver & Pancreas: Recent Labs  Lab 09/06/20 2100 09/08/20 0407 09/09/20 0342  AST 15 17  --   ALT 14 13  --   ALKPHOS 46 44  --   BILITOT 0.6 0.6  --   PROT 6.6 6.3*  --   ALBUMIN 3.5 3.2* 3.3*   No results for input(s): LIPASE, AMYLASE in the last 168 hours. Recent Labs  Lab 09/09/20 0342  AMMONIA 18   Diabetic: No results for input(s): HGBA1C in the last 72 hours. No results for input(s): GLUCAP in the last 168 hours. Cardiac Enzymes: No results for input(s): CKTOTAL, CKMB, CKMBINDEX, TROPONINI in the last 168 hours. No results for input(s): PROBNP in the last 8760 hours. Coagulation Profile: Recent Labs  Lab 09/06/20 2100  INR 1.1   Thyroid Function Tests: No results for input(s): TSH, T4TOTAL, FREET4, T3FREE, THYROIDAB in the last 72 hours. Lipid Profile: No results for input(s): CHOL, HDL, LDLCALC, TRIG, CHOLHDL, LDLDIRECT in the last 72 hours. Anemia Panel: Recent Labs    09/08/20 0407  FERRITIN 117   Urine analysis:    Component Value Date/Time   COLORURINE AMBER (A) 09/06/2020 2100   APPEARANCEUR CLOUDY (A) 09/06/2020 2100   LABSPEC 1.020 09/06/2020 2100   PHURINE 6.0 09/06/2020 2100   GLUCOSEU NEGATIVE 09/06/2020 2100   HGBUR NEGATIVE 09/06/2020 2100   BILIRUBINUR NEGATIVE 09/06/2020 2100   KETONESUR 20 (A) 09/06/2020 2100   PROTEINUR 30 (A) 09/06/2020 2100   NITRITE NEGATIVE 09/06/2020 2100   LEUKOCYTESUR MODERATE (A) 09/06/2020 2100   Sepsis Labs: Invalid input(s): PROCALCITONIN, LACTICIDVEN  Microbiology: Recent Results (from the past 240 hour(s))  Resp Panel by RT-PCR (Flu A&B, Covid) Nasopharyngeal  Swab     Status: Abnormal   Collection Time: 09/06/20  8:36 PM   Specimen: Nasopharyngeal Swab; Nasopharyngeal(NP) swabs in vial transport  medium  Result Value Ref Range Status   SARS Coronavirus 2 by RT PCR POSITIVE (A) NEGATIVE Final    Comment: RESULT CALLED TO, READ BACK BY AND VERIFIED WITH: JESSICA LAUDERMILK AT 0003 ON 09/07/20 BY MAJ (NOTE) SARS-CoV-2 target nucleic acids are DETECTED.  The SARS-CoV-2 RNA is generally detectable in upper respiratory specimens during the acute phase of infection. Positive results are indicative of the presence of the identified virus, but do not rule out bacterial infection or co-infection with other pathogens not detected by the test. Clinical correlation with patient history and other diagnostic information is necessary to determine patient infection status. The expected result is Negative.  Fact Sheet for Patients: BloggerCourse.com  Fact Sheet for Healthcare Providers: SeriousBroker.it  This test is not yet approved or cleared by the [place] FDA and  has been authorized for detection and/or diagnosis of SARS-CoV-2 by FDA under an Emergency Use Authorization (EUA).  This EUA will remain in effect (meaning this  test can be used) for the duration of  the COVID-19 declaration under Section 564(b)(1) of the Act, 21 U.S.C. section 360bbb-3(b)(1), unless the authorization is terminated or revoked sooner.     Influenza A by PCR NEGATIVE NEGATIVE Final   Influenza B by PCR NEGATIVE NEGATIVE Final    Comment: (NOTE) The Xpert Xpress SARS-CoV-2/FLU/RSV plus assay is intended as an aid in the diagnosis of influenza from Nasopharyngeal swab specimens and should not be used as a sole basis for treatment. Nasal washings and aspirates are unacceptable for Xpert Xpress SARS-CoV-2/FLU/RSV testing.  Fact Sheet for Patients: BloggerCourse.com  Fact Sheet for Healthcare Providers: SeriousBroker.it  This test is not yet approved or cleared by the [place] FDA and has been  authorized for detection and/or diagnosis of SARS-CoV-2 by FDA under an Emergency Use Authorization (EUA). This EUA will remain in effect (meaning this test can be used) for the duration of the COVID-19 declaration under Section 564(b)(1) of the Act, 21 U.S.C. section 360bbb-3(b)(1), unless the authorization is terminated or revoked.  Performed at [facility], [address] [place], [place] [address]   Blood Culture (routine x 2)     Status: None (Preliminary result)   Collection Time: 09/06/20  9:00 PM   Specimen: BLOOD  Result Value Ref Range Status   Specimen Description   Final    BLOOD BLOOD RIGHT ARM Performed at [facility], [address], [place], [place] [address]    Special Requests   Final    Blood Culture adequate volume BOTTLES DRAWN AEROBIC AND ANAEROBIC Performed at [facility], [address], [place], [place] [address]    Culture   Final    NO GROWTH 2 DAYS Performed at [facility], [address], [place], [place] [address]    Report Status PENDING  Incomplete  Urine Culture     Status: None (Preliminary result)   Collection Time: 09/06/20  9:00 PM   Specimen: In/Out Cath Urine  Result Value Ref Range Status   Specimen Description   Final    IN/OUT CATH URINE Performed at [facility], [address], [place], [place] [address]    Special Requests   Final    NONE Performed at [facility], [address] [place], [place] [address]  Culture   Final    CULTURE REINCUBATED FOR BETTER GROWTH Performed at [facility], [address] [place], [place] [address]    Report Status PENDING  Incomplete  Blood Culture (routine x 2)     Status: None (Preliminary result)   Collection Time: 09/07/20  8:21 AM   Specimen: BLOOD  Result Value Ref Range Status   Specimen Description   Final    BLOOD SITE NOT SPECIFIED Performed at [facility], [address] [place], [place] [address]    Special Requests   Final    BOTTLES DRAWN AEROBIC AND ANAEROBIC Blood Culture adequate volume Performed at [facility], [address] [place], [place] [address]    Culture  Setup Time   Final    GRAM POSITIVE COCCI ANAEROBIC BOTTLE ONLY CRITICAL RESULT CALLED TO, READ BACK BY AND VERIFIED WITH: PHARMD C.JOOJOVAC AT 1416 ON 09/08/2020 [place] T.SAAD. Performed at [facility], [address], [place], [place] [address]    Culture GRAM POSITIVE COCCI  Final   Report Status PENDING  Incomplete  Blood Culture ID Panel (Reflexed)     Status: Abnormal   Collection Time: 09/07/20  8:21 AM  Result Value Ref Range Status   Enterococcus faecalis NOT DETECTED NOT DETECTED Final   Enterococcus Faecium NOT DETECTED NOT DETECTED Final   Listeria monocytogenes NOT DETECTED NOT DETECTED Final   Staphylococcus species DETECTED (A) NOT DETECTED Final    Comment: CRITICAL RESULT CALLED TO, READ BACK BY AND VERIFIED WITH: PHARMD C.JOOJOVAC AT 1416 ON 09/08/2020 [place] T.SAAD.    Staphylococcus aureus (BCID) NOT DETECTED NOT DETECTED Final   Staphylococcus epidermidis NOT DETECTED NOT DETECTED Final   Staphylococcus lugdunensis NOT DETECTED NOT DETECTED Final   Streptococcus species NOT DETECTED NOT DETECTED Final   Streptococcus agalactiae NOT DETECTED NOT DETECTED Final   Streptococcus pneumoniae NOT DETECTED NOT DETECTED Final   Streptococcus pyogenes NOT DETECTED NOT DETECTED Final   A.calcoaceticus-baumannii NOT DETECTED NOT DETECTED Final   Bacteroides fragilis NOT DETECTED NOT DETECTED Final   Enterobacterales NOT DETECTED NOT DETECTED Final   Enterobacter cloacae complex NOT DETECTED NOT DETECTED Final   Escherichia coli NOT DETECTED NOT DETECTED Final   Klebsiella aerogenes NOT DETECTED NOT DETECTED Final   Klebsiella oxytoca NOT DETECTED NOT DETECTED Final   Klebsiella pneumoniae NOT DETECTED NOT DETECTED Final    Proteus species NOT DETECTED NOT DETECTED Final   Salmonella species NOT DETECTED NOT DETECTED Final   Serratia marcescens NOT DETECTED NOT DETECTED Final   Haemophilus influenzae NOT DETECTED NOT DETECTED Final   Neisseria meningitidis NOT DETECTED NOT DETECTED Final   Pseudomonas aeruginosa NOT DETECTED NOT DETECTED Final   Stenotrophomonas maltophilia NOT DETECTED NOT DETECTED Final   Candida albicans NOT DETECTED NOT DETECTED Final   Candida auris NOT DETECTED NOT DETECTED Final   Candida glabrata NOT DETECTED NOT DETECTED Final   Candida krusei NOT DETECTED NOT DETECTED Final   Candida parapsilosis NOT DETECTED NOT DETECTED Final   Candida tropicalis NOT DETECTED NOT DETECTED Final   Cryptococcus neoformans/gattii NOT DETECTED NOT DETECTED Final    Comment: Performed at [facility], [address], [place], [place] [address]  MRSA Next Gen by PCR, Nasal     Status: Abnormal   Collection Time: 09/07/20  5:41 PM   Specimen: Nasal Mucosa; Nasal Swab  Result Value Ref Range Status   MRSA by PCR Next Gen DETECTED (A) NOT  DETECTED Final    Comment: RESULT CALLED TO, READ BACK BY AND VERIFIED WITH: SOPHIA PICKET AT 1917 ON 09/07/20 BY MAJ (NOTE) The GeneXpert MRSA Assay (FDA approved for NASAL specimens only), is one component of a comprehensive MRSA colonization surveillance program. It is not intended to diagnose MRSA infection nor to guide or monitor treatment for MRSA infections. Test performance is not FDA approved in patients less than 85 years old. Performed at [facility], [address] [place], [place] [address]     Radiology Studies: No results found.    Micayla Brathwaite T. Chellsea Beckers [facility]  If 7PM-7AM, please contact night-coverage [email or web address] 09/09/2020, 1:50 PM

## 2020-09-09 NOTE — Progress Notes (Signed)
Pt MRSA PCR came back positive, RN notified MD. Pt tilted to left side. Pt appears to be in no acute distress at this time.

## 2020-09-10 DIAGNOSIS — Z7189 Other specified counseling: Secondary | ICD-10-CM

## 2020-09-10 DIAGNOSIS — R1312 Dysphagia, oropharyngeal phase: Secondary | ICD-10-CM

## 2020-09-10 DIAGNOSIS — Z515 Encounter for palliative care: Secondary | ICD-10-CM

## 2020-09-10 DIAGNOSIS — N390 Urinary tract infection, site not specified: Secondary | ICD-10-CM

## 2020-09-10 LAB — URINE CULTURE: Culture: >=100000 — AB

## 2020-09-10 MED ORDER — FOSFOMYCIN TROMETHAMINE 3 G PO PACK
3.0000 g | PACK | Freq: Once | ORAL | Status: AC
  Administered 2020-09-10: 3 g via ORAL
  Filled 2020-09-10: qty 3

## 2020-09-10 MED ORDER — ENSURE ENLIVE PO LIQD: 237.0000 mL | Freq: Two times a day (BID) | ORAL | 1 refills | Status: AC

## 2020-09-10 NOTE — TOC Progression Note (Signed)
Transition of Care [facility]) - Progression Note    Patient Details  Name: Jaclyn Weaver MRN: [id number] Date of Birth: [birth year]  Transition of Care [facility]) CM/SW Contact  Geni Bers, RN Phone Number: 09/10/2020, 3:19 PM  Clinical Narrative:     Transportation could get pt to facility until 12 midnight. Facility will not take her after 5 PM related to RN's not being there. PTAR was setup for 9:30 AM.  Expected Discharge Plan: Assisted Living (Memory Care) Barriers to Discharge: No Barriers Identified  Expected Discharge Plan and Services Expected Discharge Plan: Assisted Living (Memory Care)       Living arrangements for the past 2 months: Assisted Living Facility Expected Discharge Date: 09/10/20                                     Social Determinants of Health (SDOH) Interventions    Readmission Risk Interventions No flowsheet data found.

## 2020-09-10 NOTE — Progress Notes (Signed)
Patient ate 10% of her meal tonight. Pt non verbal.

## 2020-09-10 NOTE — TOC Progression Note (Signed)
Transition of Care [facility]) - Progression Note    Patient Details  Name: Jaclyn Weaver MRN: [id number] Date of Birth: 07-21-1930  Transition of Care [facility]) CM/SW Contact  Geni Bers, RN Phone Number: 09/10/2020, 1:01 PM  Clinical Narrative:     Spoke with pt's daughter Rosalita Chessman to inform her that pt will transfer back to [facility] today.   Expected Discharge Plan: Assisted Living (Memory Care) Barriers to Discharge: No Barriers Identified  Expected Discharge Plan and Services Expected Discharge Plan: Assisted Living (Memory Care)       Living arrangements for the past 2 months: Assisted Living Facility Expected Discharge Date: 09/10/20                                     Social Determinants of Health (SDOH) Interventions    Readmission Risk Interventions No flowsheet data found.

## 2020-09-10 NOTE — Discharge Summary (Signed)
Physician Discharge Summary  Jaclyn Weaver EXH:[id number] DOB: 21-Apr-1930 DOA: 09/06/2020  PCP: Soundra Pilon, FNP  Admit date: 09/06/2020 Discharge date: 09/10/2020  Admitted From: ALF Disposition: ALF  Recommendations for Outpatient Follow-up:  Follow ups as below. Please obtain CBC/BMP/Mag at follow up Palliative care follow-up at facility Please follow up on the following pending results: None  Home Health: SLP Equipment/Devices: None  Discharge Condition: Stable CODE STATUS: Full code    Hospital Course: 87 year old F with PMH of severe dementia, steroid-dependent COPD, DVT on Eliquis, dysphagia, HTN, HLD and urinary incontinence brought to ED with concern for fever, decreased oral intake and decreased mental status, and admitted for acute encephalopathy in the setting of COVID-19 infection and possible UTI.  She was febrile to 38.3 C but no leukocytosis or lactic acidosis.  COVID-19 PCR positive.  CT head negative.  CT a/p without acute finding.  UA concerning for UTI.  Cultures obtained.  Started on IV cefepime given history of Pseudomonas UTI.  Cefepime changed to aztreonam in the setting of encephalopathy.  Encephalopathy improved.  Urine culture with Aerococcus species.  Received p.o. fosfomycin to complete antibiotic course and discharged back to [facility].  Recommend speech follow-up at ALF for dysphagia and palliative care follow-up for further goal of care discussion.  She will be on dysphagia 1 diet  See individual problem list below for more on hospital course.  Discharge Diagnoses:  Acute metabolic encephalopathy-per daughter she answer yes or or no questions at baseline.  CT head without acute finding.  No apparent focal neurodeficit but limited exam due to body habitus.  Encephalopathy could be due to COVID-19 and possible UTI.  She is awake but does not talk or follows command. -IV ceftriaxone 8/12> cefepime 8/13-8/14>> IV aztreonam 8/14-8/16>> fosfomycin  8/16 -Delirium, aspiration and fall precautions precautions   Urinary tract infection: Difficult diagnosis to make without history.  She has fever but no leukocytosis.  Pro-Cal and lactic acid negative.  No suprapubic tenderness.  UA concerning for UTI.  Urine culture with Aerococcus species. -Antibiotics as above.   Abnormal blood culture: 1 out of 4 bottles with GPC likely contaminant but we do not have speciation yet.  Her MRSA PCR screen is positive.  Her Pro-Cal is negative which makes bacteremia less likely. -IV antibiotics as above   COVID-19-could be contributing to his symptoms of fever, anorexia and somnolence.  No respiratory symptoms.  CXR clear. -Completed 3 days of remdesivir. -Continue isolation precaution until 09/16/2020   Chronic COPD: Stable.  100% on RA. -Continue low-dose prednisone, ICS/LABA, as needed SABA     Hx of DVT on Eliquis-high risk for VTE due to prior history of DVT, COVID-19 infection and immobility -Continue p.o. Eliquis   Hypothyroidism -Continue home Synthroid.    Severe vascular dementia without behavioral disturbance: See encephalopathy above.  Patient's daughter is not in agreement with the diagnosis of severe dementia   Debility/generalized weakness-wheelchair dependent but able to maneuver her wheelchair at baseline   Decreased oral intake/dysphagia -On dysphagia 1 diet per SLP. -SLP follow-up at ALF   Goal of care counseling: Patient with severe dementia although patient's daughter does not seem to agree with the severity.  Appropriately [name]/DNI.   -Palliative care follow-up at ALF  Increased nutrient needs Body mass index is 21.63 kg/m. Nutrition Problem: Increased nutrient needs Etiology: acute illness, catabolic illness (COVID-19 infection) Signs/Symptoms: estimated needs Interventions: Magic cup, Ensure Enlive (each supplement provides 350kcal and 20 grams of protein)  Discharge Exam: Vitals:   09/09/20 2137 09/10/20 0634   BP:  (!) 172/87  Pulse:  80  Resp:  16  Temp:  98.2 F (36.8 C)  SpO2: 97% 96%    GENERAL: Frail looking elderly female.  No apparent distress.  Nontoxic. HEENT: MMM.  Vision and hearing grossly intact.  NECK: Supple.  No apparent JVD.  RESP: 100% on RA.  No IWOB.  Fair aeration bilaterally. CVS:  RRR. Heart sounds normal.  ABD/GI/GU: Bowel sounds present. Soft. Non tender.  MSK/EXT:  Moves extremities. No apparent deformity. No edema.  SKIN: no apparent skin lesion or wound NEURO: Awake.  Does not talk or interact.  Does not follow command. Tracking with her eye. No apparent focal neuro deficit but limited exam. PSYCH: Calm.  No distress or agitation.  Discharge Instructions  Discharge Instructions     Diet general   Complete by: As directed    Dysphagia 1 diet   Increase activity slowly   Complete by: As directed       Allergies as of 09/10/2020       Reactions   Penicillin G Shortness Of Breath   Penicillins         Medication List     TAKE these medications    acetaminophen 325 MG tablet Commonly known as: TYLENOL Take 650 mg by mouth every 4 (four) hours as needed (for pain or a temperature greater than 100 F).   alendronate-cholecalciferol 70-2800 MG-UNIT tablet Commonly known as: FOSAMAX PLUS D Take 1 tablet by mouth every 7 (seven) days. Take with a full glass of water on an empty stomach.   apixaban 5 MG Tabs tablet Commonly known as: ELIQUIS Take 5 mg by mouth 2 (two) times daily.   feeding supplement Liqd Take 237 mLs by mouth 2 (two) times daily between meals.   fluticasone-salmeterol 45-21 MCG/ACT inhaler Commonly known as: ADVAIR HFA Inhale 1 puff into the lungs 2 (two) times daily.   ipratropium 0.06 % nasal spray Commonly known as: ATROVENT Place 2 sprays into both nostrils in the morning and at bedtime.   levothyroxine 75 MCG tablet Commonly known as: SYNTHROID Take 75 mcg by mouth daily before breakfast.   multivitamin with  minerals tablet Take 1 tablet by mouth daily.   predniSONE 5 MG tablet Commonly known as: DELTASONE Take 5 mg by mouth daily with breakfast.   senna 8.6 MG Tabs tablet Commonly known as: SENOKOT Take 1 tablet by mouth daily.   simvastatin 10 MG tablet Commonly known as: ZOCOR Take 10 mg by mouth every other day.        Consultations: None  Procedures/Studies:   CT HEAD WO CONTRAST ( )  Result Date: 09/06/2020 CLINICAL DATA:  Mental status change and fever EXAM: CT HEAD WITHOUT CONTRAST TECHNIQUE: Contiguous axial images were obtained from the base of the skull through the vertex without intravenous contrast. COMPARISON:  CT brain 01/27/2020 FINDINGS: Brain: No acute territorial infarction, hemorrhage or intracranial mass. Moderate atrophy. Mild chronic small vessel ischemic changes of the white matter. Cerebellar atrophy. Stable ventricle size Vascular: No hyperdense vessels.  Carotid vascular calcification. Skull: Normal. Negative for fracture or focal lesion. Sinuses/Orbits: Mucosal thickening in the sinuses Other: None IMPRESSION: 1. No CT evidence for acute intracranial abnormality. 2. Atrophy and mild chronic small vessel ischemic changes of the white matter. Electronically Signed   By: Jasmine Pang M.D.   On: 09/06/2020 23:53   CT ABDOMEN PELVIS W CONTRAST  Result Date:  09/06/2020 CLINICAL DATA:  Fevers EXAM: CT ABDOMEN AND PELVIS WITH CONTRAST TECHNIQUE: Multidetector CT imaging of the abdomen and pelvis was performed using the standard protocol following bolus administration of intravenous contrast. CONTRAST:  68mL OMNIPAQUE IOHEXOL 350 MG/ML SOLN COMPARISON:  02/02/2020 FINDINGS: Lower chest: Lung bases are free of acute infiltrate or sizable effusion. Large hiatal hernia is noted. Hepatobiliary: No focal liver abnormality is seen. No gallstones, gallbladder wall thickening, or biliary dilatation. Pancreas: Unremarkable. No pancreatic ductal dilatation or surrounding  inflammatory changes. Spleen: Normal in size without focal abnormality. Adrenals/Urinary Tract: Adrenal glands are within normal limits. Kidneys demonstrate a normal enhancement pattern. Peripelvic cysts are noted bilaterally. No obstructive changes are seen. Ureters are within normal limits. The bladder is decompressed. Stomach/Bowel: Fecal material is noted within the rectum consistent with mild impaction. Scattered fecal material in the colon is seen consistent with a mild degree of constipation. No obstructive or inflammatory changes of the colon are noted. The appendix is within normal limits. No obstructive or inflammatory changes of the small bowel are seen. Stomach again shows a large hiatal hernia. Vascular/Lymphatic: Aortic atherosclerosis. No enlarged abdominal or pelvic lymph nodes. Reproductive: Status post hysterectomy. No adnexal masses. Other: No abdominal wall hernia or abnormality. No abdominopelvic ascites. Musculoskeletal: Degenerative changes of lumbar spine are noted. IMPRESSION: Large hiatal hernia. No mass is identified. Mild changes of constipation in the colon. Electronically Signed   By: Alcide Clever M.D.   On: 09/06/2020 23:59   DG Chest Port 1 View  Result Date: 09/06/2020 CLINICAL DATA:  Fever.  Sepsis. EXAM: PORTABLE CHEST 1 VIEW COMPARISON:  02/01/2020 FINDINGS: Stable cardiomediastinal contours. No focal airspace consolidation, pleural effusion, or pneumothorax. Chronic deformity of the proximal left humerus. IMPRESSION: No active disease. Electronically Signed   By: Duanne Guess D.O.   On: 09/06/2020 21:22       The results of significant diagnostics from this hospitalization (including imaging, microbiology, ancillary and laboratory) are listed below for reference.     Microbiology: Recent Results (from the past 240 hour(s))  Resp Panel by RT-PCR (Flu A&B, Covid) Nasopharyngeal Swab     Status: Abnormal   Collection Time: 09/06/20  8:36 PM   Specimen:  Nasopharyngeal Swab; Nasopharyngeal(NP) swabs in vial transport medium  Result Value Ref Range Status   SARS Coronavirus 2 by RT PCR POSITIVE (A) NEGATIVE Final    Comment: RESULT CALLED TO, READ BACK BY AND VERIFIED WITH: JESSICA LAUDERMILK AT 0003 ON 09/07/20 BY MAJ (NOTE) SARS-CoV-2 target nucleic acids are DETECTED.  The SARS-CoV-2 RNA is generally detectable in upper respiratory specimens during the acute phase of infection. Positive results are indicative of the presence of the identified virus, but do not rule out bacterial infection or co-infection with other pathogens not detected by the test. Clinical correlation with patient history and other diagnostic information is necessary to determine patient infection status. The expected result is Negative.  Fact Sheet for Patients: BloggerCourse.com  Fact Sheet for Healthcare Providers: SeriousBroker.it  This test is not yet approved or cleared by the [place] FDA and  has been authorized for detection and/or diagnosis of SARS-CoV-2 by FDA under an Emergency Use Authorization (EUA).  This EUA will remain in effect (meaning this  test can be used) for the duration of  the COVID-19 declaration under Section 564(b)(1) of the Act, 21 U.S.C. section 360bbb-3(b)(1), unless the authorization is terminated or revoked sooner.     Influenza A by PCR NEGATIVE NEGATIVE Final   Influenza  B by PCR NEGATIVE NEGATIVE Final    Comment: (NOTE) The Xpert Xpress SARS-CoV-2/FLU/RSV plus assay is intended as an aid in the diagnosis of influenza from Nasopharyngeal swab specimens and should not be used as a sole basis for treatment. Nasal washings and aspirates are unacceptable for Xpert Xpress SARS-CoV-2/FLU/RSV testing.  Fact Sheet for Patients: BloggerCourse.comhttps://www.fda.gov/media/152166/download  Fact Sheet for Healthcare Providers: SeriousBroker.ithttps://www.fda.gov/media/152162/download  This test is not  yet approved or cleared by the [place] FDA and has been authorized for detection and/or diagnosis of SARS-CoV-2 by FDA under an Emergency Use Authorization (EUA). This EUA will remain in effect (meaning this test can be used) for the duration of the COVID-19 declaration under Section 564(b)(1) of the Act, 21 U.S.C. section 360bbb-3(b)(1), unless the authorization is terminated or revoked.  Performed at [facility], [address] [place], [place] [address]   Blood Culture (routine x 2)     Status: None (Preliminary result)   Collection Time: 09/06/20  9:00 PM   Specimen: BLOOD  Result Value Ref Range Status   Specimen Description   Final    BLOOD BLOOD RIGHT ARM Performed at [facility], [address] [place], [place] [address]    Special Requests   Final    Blood Culture adequate volume BOTTLES DRAWN AEROBIC AND ANAEROBIC Performed at [facility], [address] [place], [place] [address]    Culture   Final    NO GROWTH 2 DAYS Performed at [facility], [address] [place], [place] [address]    Report Status PENDING  Incomplete  Urine Culture     Status: Abnormal   Collection Time: 09/06/20  9:00 PM   Specimen: In/Out Cath Urine  Result Value Ref Range Status   Specimen Description   Final    IN/OUT CATH URINE Performed at [facility], [address] [place], [place] [address]    Special Requests   Final    NONE Performed at [facility], [address] [place], [place] [address]    Culture (A)  Final    >=100,000 COLONIES/mL AEROCOCCUS SPECIES Standardized susceptibility testing for this organism is not available. Performed at [facility], [address] [place], [place] [address]    Report Status 09/10/2020 FINAL  Final  Blood Culture (routine x 2)     Status: None (Preliminary result)   Collection Time: 09/07/20  8:21 AM   Specimen: BLOOD   Result Value Ref Range Status   Specimen Description   Final    BLOOD SITE NOT SPECIFIED Performed at [facility], [address] [place], [place] [address]    Special Requests   Final    BOTTLES DRAWN AEROBIC AND ANAEROBIC Blood Culture adequate volume Performed at [facility], [address] [place], [place] [address]    Culture  Setup Time   Final    GRAM POSITIVE COCCI ANAEROBIC BOTTLE ONLY CRITICAL RESULT CALLED TO, READ BACK BY AND VERIFIED WITH: PHARMD C.JOOJOVAC AT 1416 ON 09/08/2020 [place] T.SAAD.    Culture   Final    GRAM POSITIVE COCCI CULTURE REINCUBATED FOR BETTER GROWTH Performed at [facility], [address] [place], [place] [address]    Report Status PENDING  Incomplete  Blood Culture ID Panel (Reflexed)     Status: Abnormal   Collection Time: 09/07/20  8:21 AM  Result Value Ref Range Status   Enterococcus  faecalis NOT DETECTED NOT DETECTED Final   Enterococcus Faecium NOT DETECTED NOT DETECTED Final   Listeria monocytogenes NOT DETECTED NOT DETECTED Final   Staphylococcus species DETECTED (A) NOT DETECTED Final    Comment: CRITICAL RESULT CALLED TO, READ BACK BY AND VERIFIED WITH: PHARMD C.JOOJOVAC AT 1416 ON 09/08/2020 [place] T.SAAD.    Staphylococcus aureus (BCID) NOT DETECTED NOT DETECTED Final   Staphylococcus epidermidis NOT DETECTED NOT DETECTED Final   Staphylococcus lugdunensis NOT DETECTED NOT DETECTED Final   Streptococcus species NOT DETECTED NOT DETECTED Final   Streptococcus agalactiae NOT DETECTED NOT DETECTED Final   Streptococcus pneumoniae NOT DETECTED NOT DETECTED Final   Streptococcus pyogenes NOT DETECTED NOT DETECTED Final   A.calcoaceticus-baumannii NOT DETECTED NOT DETECTED Final   Bacteroides fragilis NOT DETECTED NOT DETECTED Final   Enterobacterales NOT DETECTED NOT DETECTED Final   Enterobacter cloacae complex NOT DETECTED NOT DETECTED Final   Escherichia coli NOT DETECTED NOT DETECTED  Final   Klebsiella aerogenes NOT DETECTED NOT DETECTED Final   Klebsiella oxytoca NOT DETECTED NOT DETECTED Final   Klebsiella pneumoniae NOT DETECTED NOT DETECTED Final   Proteus species NOT DETECTED NOT DETECTED Final   Salmonella species NOT DETECTED NOT DETECTED Final   Serratia marcescens NOT DETECTED NOT DETECTED Final   Haemophilus influenzae NOT DETECTED NOT DETECTED Final   Neisseria meningitidis NOT DETECTED NOT DETECTED Final   Pseudomonas aeruginosa NOT DETECTED NOT DETECTED Final   Stenotrophomonas maltophilia NOT DETECTED NOT DETECTED Final   Candida albicans NOT DETECTED NOT DETECTED Final   Candida auris NOT DETECTED NOT DETECTED Final   Candida glabrata NOT DETECTED NOT DETECTED Final   Candida krusei NOT DETECTED NOT DETECTED Final   Candida parapsilosis NOT DETECTED NOT DETECTED Final   Candida tropicalis NOT DETECTED NOT DETECTED Final   Cryptococcus neoformans/gattii NOT DETECTED NOT DETECTED Final    Comment: Performed at [facility], [address] [place], [place] [address]  MRSA Next Gen by PCR, Nasal     Status: Abnormal   Collection Time: 09/07/20  5:41 PM   Specimen: Nasal Mucosa; Nasal Swab  Result Value Ref Range Status   MRSA by PCR Next Gen DETECTED (A) NOT DETECTED Final    Comment: RESULT CALLED TO, READ BACK BY AND VERIFIED WITH: SOPHIA PICKET AT 1917 ON 09/07/20 BY MAJ (NOTE) The GeneXpert MRSA Assay (FDA approved for NASAL specimens only), is one component of a comprehensive MRSA colonization surveillance program. It is not intended to diagnose MRSA infection nor to guide or monitor treatment for MRSA infections. Test performance is not FDA approved in patients less than 53 years old. Performed at [facility], [address] [place], [place] [address]      Labs:  CBC: Recent Labs  Lab 09/06/20 2100 09/06/20 2203 09/08/20 0407 09/09/20 0342  WBC 8.4  --  8.5 5.5  NEUTROABS 6.2  --  5.9  --   HGB 15.6*  12.6 12.9 13.2  HCT 47.6* 37.0 39.2 40.1  MCV 102.8*  --  101.6* 100.0  PLT 175  --  192 224   BMP &GFR Recent Labs  Lab 09/06/20 2100 09/06/20 2203 09/08/20 0407 09/09/20 0342  NA 140 138 137 138  K 4.1 4.2 3.5 3.6  CL 106 106 103 107  CO2 24  --  26 26  GLUCOSE 106* 106* 100* 95  BUN 22 22 14 15   CREATININE 0.75 0.70 0.61 0.59  CALCIUM 9.4  --  8.8*  8.9  MG  --   --  2.2 2.4  PHOS  --   --  2.0* 3.6   Estimated Creatinine Clearance: 42.8 mL/min (by C-G formula based on SCr of 0.59 mg/dL). Liver & Pancreas: Recent Labs  Lab 09/06/20 2100 09/08/20 0407 09/09/20 0342  AST 15 17  --   ALT 14 13  --   ALKPHOS 46 44  --   BILITOT 0.6 0.6  --   PROT 6.6 6.3*  --   ALBUMIN 3.5 3.2* 3.3*   No results for input(s): LIPASE, AMYLASE in the last 168 hours. Recent Labs  Lab 09/09/20 0342  AMMONIA 18   Diabetic: No results for input(s): HGBA1C in the last 72 hours. No results for input(s): GLUCAP in the last 168 hours. Cardiac Enzymes: No results for input(s): CKTOTAL, CKMB, CKMBINDEX, TROPONINI in the last 168 hours. No results for input(s): PROBNP in the last 8760 hours. Coagulation Profile: Recent Labs  Lab 09/06/20 2100  INR 1.1   Thyroid Function Tests: No results for input(s): TSH, T4TOTAL, FREET4, T3FREE, THYROIDAB in the last 72 hours. Lipid Profile: No results for input(s): CHOL, HDL, LDLCALC, TRIG, CHOLHDL, LDLDIRECT in the last 72 hours. Anemia Panel: Recent Labs    09/08/20 0407  FERRITIN 117   Urine analysis:    Component Value Date/Time   COLORURINE AMBER (A) 09/06/2020 2100   APPEARANCEUR CLOUDY (A) 09/06/2020 2100   LABSPEC 1.020 09/06/2020 2100   PHURINE 6.0 09/06/2020 2100   GLUCOSEU NEGATIVE 09/06/2020 2100   HGBUR NEGATIVE 09/06/2020 2100   BILIRUBINUR NEGATIVE 09/06/2020 2100   KETONESUR 20 (A) 09/06/2020 2100   PROTEINUR 30 (A) 09/06/2020 2100   NITRITE NEGATIVE 09/06/2020 2100   LEUKOCYTESUR MODERATE (A) 09/06/2020 2100    Sepsis Labs: Invalid input(s): PROCALCITONIN, LACTICIDVEN   Time coordinating discharge: 40 minutes  SIGNED:  Almon Hercules, MD  [facility] 09/10/2020, 12:47 PM  If 7PM-7AM, please contact night-coverage [email or web address]

## 2020-09-10 NOTE — [place] FL2 (Signed)
[place] MEDICAID FL2 LEVEL OF CARE SCREENING TOOL     IDENTIFICATION  Patient Name: Jaclyn Weaver Birthdate: Dec 02, 1930 Sex: female Admission Date (Current Location): 09/06/2020  [facility] and [place] Number:  Producer, television/film/video and Address:  [facility],  [address] [place], [place] [address]      Provider Number: [id number]  Attending Physician Name and Address:  Almon Hercules, MD  Relative Name and Phone Number:  [facility] Daughter   [phone], Glorine, Hanratty   [phone],TIRWERXV, QMGQQPY Granddaughter   [phone]    Current Level of Care: Hospital Recommended Level of Care: Assisted Living Facility Prior Approval Number:    Date Approved/Denied:   PASRR Number:    Discharge Plan: Other (Comment) (ALF)    Current Diagnoses: Patient Active Problem List   Diagnosis Date Noted   Acute encephalopathy 09/07/2020   Acute COVID-19 09/07/2020   History of venous thrombosis 09/07/2020   Swelling of forearm 02/05/2020   Complicated UTI (urinary tract infection) 02/02/2020   Ureteral stone with hydronephrosis 02/02/2020   Leukocytosis 02/02/2020   Dementia (HCC) 02/02/2020   COPD (chronic obstructive pulmonary disease) (HCC) 02/02/2020    Orientation RESPIRATION BLADDER Height & Weight     Self (Pt is not Oriented)  Normal Incontinent Weight: 57.2 kg Height:  5\' 4"  (162.6 cm)  BEHAVIORAL SYMPTOMS/MOOD NEUROLOGICAL BOWEL NUTRITION STATUS      Incontinent Diet (Soft diet)  AMBULATORY STATUS COMMUNICATION OF NEEDS Skin   Total Care Non-Verbally (Not Talking) Skin abrasions (Right leg)                       Personal Care Assistance Level of Assistance  Total care           Functional Limitations Info  Sight, Hearing, Speech Sight Info: Adequate Hearing Info: Adequate Speech Info: Impaired    SPECIAL CARE FACTORS FREQUENCY  PT (By licensed PT), OT (By licensed OT)     PT Frequency: Eval and Treat OT Frequency: Eval and  Treat            Contractures Contractures Info: Not present    Additional Factors Info  Code Status, Allergies Code Status Info: [name] Allergies Info: Penicillin G, Penicillins           Current Medications (09/10/2020):  This is the current hospital active medication list Current Facility-Administered Medications  Medication Dose Route Frequency Provider Last Rate Last Admin   acetaminophen (TYLENOL) tablet 650 mg  650 mg Oral Q6H PRN Opyd, 09/12/2020, MD   650 mg at 09/08/20 2123   albuterol (VENTOLIN HFA) 108 (90 Base) MCG/ACT inhaler 2 puff  2 puff Inhalation Q4H PRN Opyd, 2124, MD       apixaban (ELIQUIS) tablet 5 mg  5 mg Oral BID Lavone Neri T, MD   5 mg at 09/10/20 0935   bisacodyl (DULCOLAX) EC tablet 5 mg  5 mg Oral Daily PRN Opyd, 09/12/20, MD       Chlorhexidine Gluconate Cloth 2 % PADS 6 each  6 each Topical Q0600 Lavone Neri T, MD   6 each at 09/10/20 0630   feeding supplement (ENSURE ENLIVE / ENSURE PLUS) liquid 237 mL  237 mL Oral BID BM Gonfa, Taye T, MD   237 mL at 09/10/20 0936   fosfomycin (MONUROL) packet 3 g  3 g Oral Once 09/12/20, MD       levothyroxine (SYNTHROID) tablet 75 mcg  75 mcg Oral  [id number] Briscoe Deutscher, MD   75 mcg at 09/10/20 0630   mometasone-formoterol (DULERA) 100-5 MCG/ACT inhaler 2 puff  2 puff Inhalation BID Opyd, Lavone Neri, MD   2 puff at 09/10/20 0936   mupirocin ointment (BACTROBAN) 2 % 1 application  1 application Nasal BID Almon Hercules, MD   1 application at 09/10/20 0936   ondansetron (ZOFRAN) tablet 4 mg  4 mg Oral Q6H PRN Opyd, Lavone Neri, MD       Or   ondansetron (ZOFRAN) injection 4 mg  4 mg Intravenous Q6H PRN Opyd, Lavone Neri, MD       pneumococcal 23 valent vaccine (PNEUMOVAX-23) injection 0.5 mL  0.5 mL Intramuscular Tomorrow-1000 Gonfa, Taye T, MD       predniSONE (DELTASONE) tablet 5 mg  5 mg Oral Q breakfast Opyd, Lavone Neri, MD   5 mg at 09/10/20 0935   senna-docusate (Senokot-S) tablet 1 tablet  1 tablet Oral QHS  PRN Opyd, Lavone Neri, MD       simvastatin (ZOCOR) tablet 10 mg  10 mg Oral QODAY Opyd, Lavone Neri, MD   10 mg at 09/10/20 0935     Discharge Medications: Please see discharge summary for a list of discharge medications.  Relevant Imaging Results:  Relevant Lab Results:   Additional Information SS#[id number]  Geni Bers, RN

## 2020-09-10 NOTE — Consult Note (Signed)
Consultation Note Date: 09/10/2020   Patient Name: Jaclyn Weaver  DOB: 11-Nov-1930  MRN: [id number]  Age / Sex: 87 y.o., female  PCP: Jaclyn Loader, FNP Referring Physician: Mercy Riding, MD  Reason for Consultation: Establishing goals of care  HPI/Patient Profile: 87 y.o. female  admitted on 09/06/2020    Clinical Assessment and Goals of Care:  87 year old lady, who has been at [facility] since almost a year now. Prior to that, she was in [place] and the patient, Weaver and grand Weaver moved to [place] a year ago. The patient has dementia, COPD, DVT on Eliquis, HTN HLD and urinary incontinence. She has been admitted for decreased oral intake and decreased mental status, and admitted for acute encephalopathy in the setting of COVID-19 infection and possible UTI.   Patient has been admitted to hospital medicine service for being COVID-19 positive, acute metabolic encephalopathy, possible urinary tract infection, abnormal blood culture likely contaminant, debility and generalized weakness.  Patient's baseline is such that she is wheelchair dependent however is able to maneuver her wheelchair at baseline, does not verbalize much but is able to recognize family members.  I met with the patient's grand Weaver at bedside. I introduced myself and palliative care as follows: Palliative medicine is specialized medical care for people living with serious illness. It focuses on providing relief from the symptoms and stress of a serious illness. The goal is to improve quality of life for both the patient and the family. Goals of care: Broad aims of medical therapy in relation to the patient's values and preferences. Our aim is to provide medical care aimed at enabling patients to achieve the goals that matter most to them, given the circumstances of their particular medical situation and their constraints.    Life review performed and also discussed about her baseline and underlying conditions, current hospitalization was also discussed. See below.   HCPOA  Weaver Jaclyn Weaver is HCPOA agent.   SUMMARY OF RECOMMENDATIONS    Agree with [name] Continue current mode of care Goals of care discussions with family member; grand Weaver (Jaclyn Weaver) present at bedside: to continue current mode of care, to follow hospital course and overall disease trajectory of illness. Family remains hopeful for some degree of recovery so that the patient might be able to return to her facility. Recommend palliative care services following the patient at [facility] facility.  Thank you for the consult.   Code Status/Advance Care Planning: [name]   Symptom Management:     Palliative Prophylaxis:  Delirium Protocol  Additional Recommendations (Limitations, Scope, Preferences): Full Scope Treatment  Psycho-social/Spiritual:  Desire for further Chaplaincy support:yes Additional Recommendations: Caregiving  Support/Resources  Prognosis:  Unable to determine  Discharge Planning: To Be Determined      Primary Diagnoses: Present on Admission:  Acute encephalopathy  Acute COVID-19  Dementia ([place])  COPD (chronic obstructive pulmonary disease) ([place])   I have reviewed the medical record, interviewed the patient and family, and examined the patient. The following aspects are pertinent.  Past Medical History:  Diagnosis Date   Constipation    from facility paperwork   COPD (chronic obstructive pulmonary disease) (HCC)    Dementia (HCC)    from facility paperwork   Dermatitis    from facility paperwork   Hyperlipidemia    from facility paperwork   Hypertension    Hypothyroidism    Osteoporosis    from facility paperwork   Urinary incontinence    from facility paperwork   Vascular degeneration    Pt is nonverbal due to this   Social History   Socioeconomic History   Marital status:  Single    Spouse name: Not on file   Number of children: Not on file   Years of education: Not on file   Highest education Weaver: Not on file  Occupational History   Not on file  Tobacco Use   Smoking status: Never   Smokeless tobacco: Never  Vaping Use   Vaping Use: Never used  Substance and Sexual Activity   Alcohol use: Never   Drug use: Never   Sexual activity: Not on file  Other Topics Concern   Not on file  Social History Narrative   ** Merged History Encounter **       Social Determinants of Health   Financial Resource Strain: Not on file  Food Insecurity: Not on file  Transportation Needs: Not on file  Physical Activity: Not on file  Stress: Not on file  Social Connections: Not on file   History reviewed. No pertinent family history. Scheduled Meds:  apixaban  5 mg Oral BID   Chlorhexidine Gluconate Cloth  6 each Topical Q0600   feeding supplement  237 mL Oral BID BM   fosfomycin  3 g Oral Once   levothyroxine  75 mcg Oral Q0600   mometasone-formoterol  2 puff Inhalation BID   mupirocin ointment  1 application Nasal BID   pneumococcal 23 valent vaccine  0.5 mL Intramuscular Tomorrow-1000   predniSONE  5 mg Oral Q breakfast   simvastatin  10 mg Oral QODAY   Continuous Infusions: PRN Meds:.acetaminophen, albuterol, bisacodyl, ondansetron **OR** ondansetron (ZOFRAN) IV, senna-docusate Medications Prior to Admission:  Prior to Admission medications   Medication Sig Start Date End Date Taking? Authorizing Provider  acetaminophen (TYLENOL) 325 MG tablet Take 650 mg by mouth every 4 (four) hours as needed (for pain or a temperature greater than 100 F).   Yes [provider]  alendronate-cholecalciferol (FOSAMAX PLUS D) 70-2800 MG-UNIT tablet Take 1 tablet by mouth every 7 (seven) days. Take with a full glass of water on an empty stomach.   Yes [provider]  apixaban (ELIQUIS) 5 MG TABS tablet Take 5 mg by mouth 2 (two) times daily.   Yes  [provider]  fluticasone-salmeterol (ADVAIR HFA) 45-21 MCG/ACT inhaler Inhale 1 puff into the lungs 2 (two) times daily.   Yes [provider]  ipratropium (ATROVENT) 0.06 % nasal spray Place 2 sprays into both nostrils in the morning and at bedtime.   Yes [provider]  levothyroxine (SYNTHROID) 75 MCG tablet Take 75 mcg by mouth daily before breakfast.   Yes [provider]  Multiple Vitamins-Minerals (MULTIVITAMIN WITH MINERALS) tablet Take 1 tablet by mouth daily.   Yes [provider]  predniSONE (DELTASONE) 5 MG tablet Take 5 mg by mouth daily with breakfast.   Yes [provider]  senna (SENOKOT) 8.6 MG TABS tablet Take 1 tablet by mouth daily.  Yes [provider]  simvastatin (ZOCOR) 10 MG tablet Take 10 mg by mouth every other day.   Yes [provider]   Allergies  Allergen Reactions   Penicillin G Shortness Of Breath   Penicillins    Review of Systems Does not verbalize with me   Physical Exam Elderly lady resting in bed No distress Recognizes grand Weaver at bedside Does not verbalize Regular work of breathing S 1 S 2  No edema Not agitated.   Vital Signs: BP (!) 172/87 (BP Location: Right Arm)   Pulse 80   Temp 98.2 F (36.8 C) (Oral)   Resp 16   Ht $R'5\' 4"'aT$  (1.626 m)   Wt 57.2 kg   SpO2 96%   BMI 21.63 kg/m  Pain Scale: Faces POSS *See Group Information*: 1-Acceptable,Awake and alert Pain Score: Asleep   SpO2: SpO2: 96 % O2 Device:SpO2: 96 % O2 Flow Rate: .   IO: Intake/output summary:  Intake/Output Summary (Last 24 hours) at 09/10/2020 1209 Last data filed at 09/10/2020 0298 Gross per 24 hour  Intake 200 ml  Output 1000 ml  Net -800 ml    LBM: Last BM Date: 09/08/20 Baseline Weight: Weight: 57.2 kg Most recent weight: Weight: 57.2 kg     Palliative Assessment/Data:   PPS 40%  Time In:  11 Time Out:  12 Time Total:  60  Greater than 50%  of this time was spent  counseling and coordinating care related to the above assessment and plan.  Signed by: Loistine Chance, MD   Please contact Palliative Medicine Team phone at [phone] for questions and concerns.  For individual provider: See Shea Evans

## 2020-09-11 LAB — CULTURE, BLOOD (ROUTINE X 2): Special Requests: ADEQUATE

## 2020-09-11 NOTE — Plan of Care (Signed)

## 2020-09-11 NOTE — Progress Notes (Signed)
Patient discharged to [facility].  Transported via stretcher, per mobile transport.  Report called to Cyndi Bender, RCD.  Condition stable upon discharge.

## 2020-09-12 LAB — CULTURE, BLOOD (ROUTINE X 2)
Culture: NO GROWTH
Special Requests: ADEQUATE

## 2020-09-18 ENCOUNTER — Telehealth: Payer: Self-pay

## 2020-09-18 NOTE — Telephone Encounter (Signed)
(  5:02 pm)SW left message for patient's daugher -Darl Pikes advising that the RN/SW team was available to visit patient on  Friday and requested a call back to discuss referral further.  *SW talked with the facility nurse-Jennifer to advise her that if family was agreeable, RN/SW will visit patient on Friday.

## 2020-09-19 ENCOUNTER — Telehealth: Payer: Self-pay

## 2020-09-19 NOTE — Telephone Encounter (Signed)
(  4:29 pm) Palliative care SW left a message for patient's daughter-Susan requesting a call back to schedule patient's visits.

## 2020-09-20 ENCOUNTER — Telehealth: Payer: Self-pay

## 2020-09-20 NOTE — Telephone Encounter (Signed)
12:06 pm) SW received call from patient's daughter-Suzanne. She scheduled initial visit with patient for 09/23/20 @ 11: 30 am.

## 2020-09-23 ENCOUNTER — Other Ambulatory Visit: Payer: Medicare Other | Admitting: *Deleted

## 2020-09-23 ENCOUNTER — Other Ambulatory Visit: Payer: Medicare Other

## 2020-09-23 ENCOUNTER — Other Ambulatory Visit: Payer: Self-pay

## 2020-09-23 DIAGNOSIS — Z515 Encounter for palliative care: Secondary | ICD-10-CM

## 2020-09-23 NOTE — Progress Notes (Signed)
[facility] RN NOTE  PATIENT NAME: Jaclyn Weaver DOB: Oct 04, 1930 MRN: [id number]  PRIMARY CARE PROVIDER: Kristen Loader, FNP  RESPONSIBLE PARTY:  Acct ID - Guarantor Home Phone Work Phone Relationship Acct Type  [id number] [name], [name] [phone]  Self P/F     [address], [place], [place] [address]   Covid-19 Pre-screening Negative  PLAN OF CARE and INTERVENTION:  ADVANCE CARE PLANNING/GOALS OF CARE: Goal is for patient to remain at current facility. She has a [name]. PATIENT/CAREGIVER EDUCATION: Explained palliative care services, symptom management, safe transfers DISEASE STATUS: Joint palliative care visit made with LCSW, Jaclyn Weaver. Met with patient on the Memory care unit at [facility] where patient resides. Received update from facility staff. Patient had Covid-19 about 2 weeks ago. During this time patient became progressively weaker, required increased assistance with all ADLs, and was also diagnosed with Pneumonia. Since then, patient has returned back to her baseline. She is mainly non-verbal, but will nod yes or no to questions asked. She denies pain and no physical indicators of pain noted. No respiratory issues or coughing noted. She is able to propel herself around the unit in her wheelchair. She pulls herself along using the side rails. She does requires 1-2 person assistance with all ADLs. A few months ago she was able to stand by grabbing onto a rail when working with therapy, but is now unable. She has had PT on several different occasions but is discharged due to non-progression so they will not be working with her anymore. She is a high fall risk, but has not had any recent falls. Her intake is variable. She has lost 16 lbs over the past year, however her weight has been stable over the past few months. Current weight 120 lbs. She was having some issues with dysphagia. She was downgraded to nectar thickened liquids about 3 months ago, and  downgraded to Pureed consistency 3 days ago. Staff now has to feed patient during meals, as patient will consistently try to leave the table. Otherwise she would not eat. She is incontinent of both bowel and bladder and wears adult briefs. She has several skin tears on her arms. Her skin is very thin/frail. She has on geri sleeves for protection. Daughter agreeable to palliative care services. Will continue to monitor.  HISTORY OF PRESENT ILLNESS: This is a 87 yo with a diagnosis of Dementia. She has a history of COPD and venous thrombosis. Palliative care team was asked to follow patient for goals of care, symptom management and complex decision making.   CODE STATUS: [name] ADVANCED DIRECTIVES: Y MOST FORM: no PPS: 30%   (Duration of visit and documentation 60 minutes)   Jaclyn Eastern, RN BSN

## 2020-09-24 NOTE — Progress Notes (Signed)
COMMUNITY PALLIATIVE CARE SW NOTE  PATIENT NAME: Jaclyn Weaver DOB: Sep 17, 1930 MRN: [id number]  PRIMARY CARE PROVIDER: Soundra Pilon, FNP  RESPONSIBLE PARTY:  Acct ID - Guarantor Home Phone Work Phone Relationship Acct Type  [id number] MAHUM, BETTEN* [phone]  Self P/F     [name], [place], [place] [address]  COVID-19 Pre-screening is Negative.    PLAN OF CARE and INTERVENTIONS:             GOALS OF CARE/ ADVANCE CARE PLANNING:  Goal is for patient to remain in her current facility. Patient is a [name]. SOCIAL/EMOTIONAL/SPIRITUAL ASSESSMENT/ INTERVENTIONS:  SW and RN- M. Dimas Aguas completed a face-to-face visit with patient at [facility] memory care. Prior to visit the facility nurses provided an updated on patient. The memory care coordinator and staff on the unit provided additional information on patient. Patient has COVID and pneumonia. approximately 2 weeks ago where she was weak, required assistance with eating  and ADL's. Patient has recovered and seem to be back at her baseline. Patient is minimally verbal, but is responsive to simple yes/no questions  by giving a soft answer or nod her head. She did not appear to be in any pain or discomfort. Patient ambulates herself in her wheelchair at will, but pulling herself around the unit. Patient requires 1-2 person assistance for all ADL's. Patient has lost 16 lbs over the past year, however her weight has remains stable at 120 lbs. Her intake good, but she requires assistance cueing and encouragement from the staff. However her diet has been downgraded to pureed 3 days ago with nectar-thick liquids. Patient wears geri sleeves and ted hose for skin protection. Patient is dependent for personal care needs. Patient's daughter was scheduled to be present for this initial visit, however she did not show up.  PATIENT/CAREGIVER EDUCATION/ COPING:  Patient appears to be content and coping well.  PERSONAL EMERGENCY PLAN:  Per facility  protocol.  COMMUNITY RESOURCES COORDINATION/ HEALTH CARE NAVIGATION: None.  FINANCIAL/LEGAL CONCERNS/INTERVENTIONS:  None.     SOCIAL HX:  Social History   Tobacco Use   Smoking status: Never   Smokeless tobacco: Never  Substance Use Topics   Alcohol use: Never    CODE STATUS: [name] ADVANCED DIRECTIVES: No MOST FORM COMPLETE: No HOSPICE EDUCATION PROVIDED: No.  PPS: Patient is alert  and oriented to self only and is pleasantly confused. She is dependent for personal care needs.   Duration of visit and documentation: 60 minutes  Clydia Llano, LCSW

## 2020-10-10 ENCOUNTER — Non-Acute Institutional Stay: Payer: Medicare Other | Admitting: *Deleted

## 2020-10-10 ENCOUNTER — Other Ambulatory Visit: Payer: Self-pay

## 2020-10-10 ENCOUNTER — Non-Acute Institutional Stay: Payer: Medicare Other

## 2020-10-10 DIAGNOSIS — Z515 Encounter for palliative care: Secondary | ICD-10-CM

## 2020-10-15 NOTE — Progress Notes (Signed)
[facility] RN NOTE  PATIENT NAME: Jaclyn Weaver DOB: [birth year] MRN: [id number]  PRIMARY CARE PROVIDER: Soundra Pilon, FNP  RESPONSIBLE PARTY:  Acct ID - Guarantor Home Phone Work Phone Relationship Acct Type  [id number] Jaclyn Weaver, Jaclyn Weaver* [phone]  Self P/F     [name], [place], [place] [address]   Covid-19 Pre-screening Negative  PLAN OF CARE and INTERVENTION:  ADVANCE CARE PLANNING/GOALS OF CARE: Goal is for patient to remain at current facility [facility]) PATIENT/CAREGIVER EDUCATION: Hospice education, symptom management, safe transfers, s/s of infection DISEASE STATUS: Joint follow up visit completed with LCSW, Jackelyn Poling. Facility nurse Victorino Dike requested a visit due to continued patient decline. Upon arrival, patient is lying in bed asleep on her left side. She did not arouse with verbal or tactile stimulation. She is nonverbal. Spoke with facility CNA Shanequa who states that she has noticed that patient is weaker. She requires 1 person assistance with all ADLs. She now must be fed by staff as she is no longer able to feed herself. She is non-ambulatory, but is able to propel herself on the unit once placed in her wheelchair. Apparent muscle wasting noted in her face and upper body. Facility housekeeper is also noticing that patient is losing weight. She is starting to cough/choke more during meals. She is already on a Pureed diet with nectar thickened liquids. She was eating 51-75% of meals, but now only eating 25% or less. She has lost 5 lbs since last month. Current weight 117 lbs. They are noticing that patient is starting not to open her mouth to take in medications. She is constantly clearing her throat. She has trace bilateral lower extremity edema. She continues to wear geri sleeves on both arms for protection as her skin is very thin/frail. Spoke with both facility nurses Minerva Fester who provided patient update.  Facility would like for patient to be placed under hospice care. Explained that we will have to speak with patient's daughter to get permission to request an order for a hospice consult. Facility verbalized understanding.   HISTORY OF PRESENT ILLNESS:  This is a 87 yo with a diagnosis of Dementia. She has a history of COPD and venous thrombosis. Palliative care team continues to follow patient for goals of care, symptom management and complex decision making.   CODE STATUS: [name] ADVANCED DIRECTIVES: Y MOST FORM: no PPS: 30%   (Duration of visit and documentation 45 minutes)   Candiss Norse, RN BSN

## 2020-10-16 ENCOUNTER — Telehealth: Payer: Self-pay

## 2020-10-16 NOTE — Telephone Encounter (Signed)
(  4:50 pm) Palliative care SW completed follow-up call to patient's daughter-Jaclyn Weaver to discuss recent visit with patient and noted decline. SW advised Jaclyn Weaver that patient seemed disengaged this last visit, she has noted weight loss, 117 lbs down from 122 lbs and her overall intake has declined to 25% or less, which down from 50-75%. Patient appears thinner in the face.The staff report that patient is coughing with food/fluids despite modified diet of puree and nectar thinned liquids. Staff also report that patient intermittently will refuse to open her mouth to take medications.  SW provided education to [place] regarding the benefits of hospice. SW also provided reassurance to her that the hospice team will provide regular communication/updates to her, which could be helpful to her as she has a job where she travels. Jaclyn Weaver provided permission to SW to proceed with hospice consult.  *Palliative care RN updated for follow-up.

## 2020-10-24 NOTE — Progress Notes (Signed)
COMMUNITY PALLIATIVE CARE SW NOTE  PATIENT NAME: Jaclyn Weaver DOB: 05-Apr-1930 MRN: [id number]  PRIMARY CARE PROVIDER: Soundra Pilon, FNP  RESPONSIBLE PARTY:  Acct ID - Guarantor Home Phone Work Phone Relationship Acct Type  [id number] Jaclyn, Weaver* [phone]  Self P/F     [name], [place], [place] [address]     PLAN OF CARE and INTERVENTIONS:             GOALS OF CARE/ ADVANCE CARE PLANNING:  Goal is for patient to remain at the facility.  SOCIAL/EMOTIONAL/SPIRITUAL ASSESSMENT/ INTERVENTIONS:  SW and RN-M. Dimas Aguas completed a follow-up visit with patient at the facility per request of facility LPN-Jennifer due to change in patient's condition. The team found patient in bed, laying on her side, facing the wall and sleeping. She did not appear to be in any pain or discomfort. Patient did not arouse to any verbal or tactile prompts. Patient minimally verbal generally. The facility CNA-[place] was consulted regarding patient status. She report that patient is weaker overall and is a one person assist with ADL's. She now being fed by staff as she is no longer able to feed herself. Patient is not propelling herself in her wheelchair as she doing two weeks ago. Patient appears to be thinner in her face and throughout her extremities.  Staff also note weight loss, coughing and choking with patient. Patient is now eating 25% of her pureed diet. She has lost 5 lbs since last month and her current weight is 117 lbs. Patient is having instances where she is refusing to open her mouth to take her medications. Both facility nurses, Victorino Dike and Lurena Joiner was consulted. They both affirmed staff reports of patient decline. They request that patient be assessed for hospice. SW provided supportive presence, assessment of patient's needs and comfort, observation and consult with facility staff.  PATIENT/CAREGIVER EDUCATION/ COPING:  Patient decline is noted.  PERSONAL EMERGENCY PLAN:  Per facility  protocol. COMMUNITY RESOURCES COORDINATION/ HEALTH CARE NAVIGATION:  None. FINANCIAL/LEGAL CONCERNS/INTERVENTIONS:  None.     SOCIAL HX:  Social History   Tobacco Use   Smoking status: Never   Smokeless tobacco: Never  Substance Use Topics   Alcohol use: Never    CODE STATUS: To be assessed ADVANCED DIRECTIVES: No MOST FORM COMPLETE:  No HOSPICE EDUCATION PROVIDED: No  PPS: Patient is minimally verbal. She is dependent for ADL's. Patient's intake has decreased, and she is no longer ambulating herself in her walker.  Duration of visit and documentation: 60 minutes.   [address] [place], [place]

## 2021-05-26 DEATH — deceased

## 2023-02-08 IMAGING — CT CT ABD-PELV W/ CM
2 of 5 series · 16 of 46 positions shown, 18 images · IV contrast (omnipaque)
Comparison: 02/02/2020

CLINICAL DATA: Fevers

EXAM:
CT ABDOMEN AND PELVIS WITH CONTRAST
TECHNIQUE: Multidetector CT imaging of the abdomen and pelvis was performed
using the standard protocol following bolus administration of
intravenous contrast.
CONTRAST:  80mL OMNIPAQUE IOHEXOL 350 MG/ML SOLN

[Series 2: axial st · axial · 0.72mm/px · z∈[-429,-69]mm · 13 of 84 slices shown, 15 images]
[im 6/84  soft-tissue]
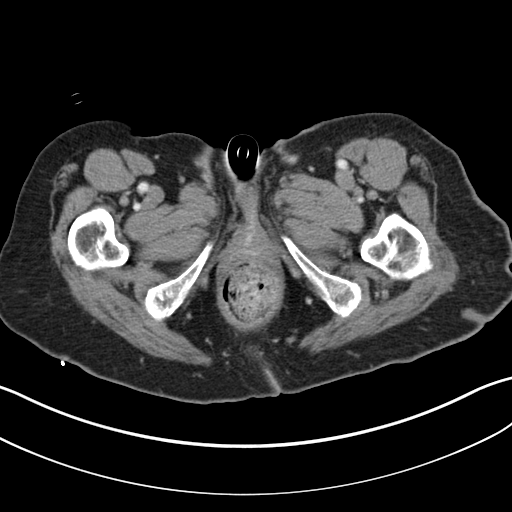
[im 6/84  bone]
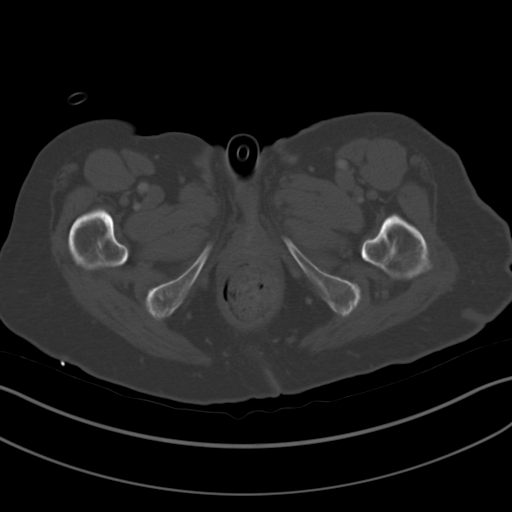
[im 12/84  soft-tissue]
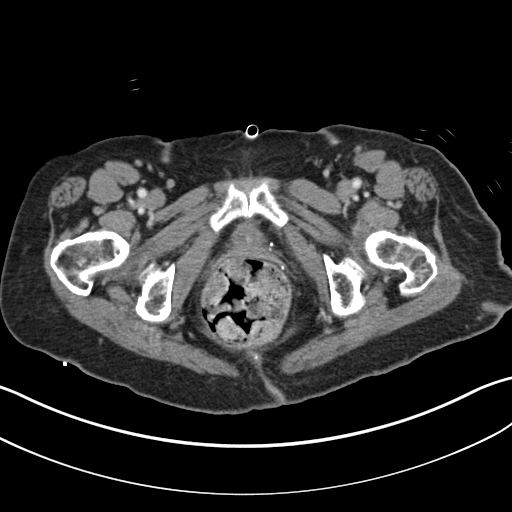
[im 17/84  soft-tissue]
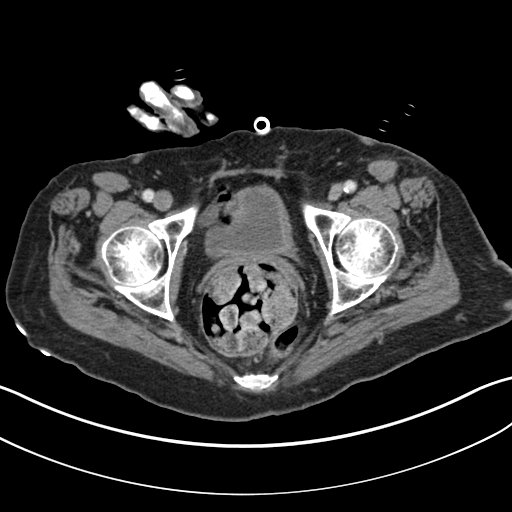
[im 23/84  soft-tissue]
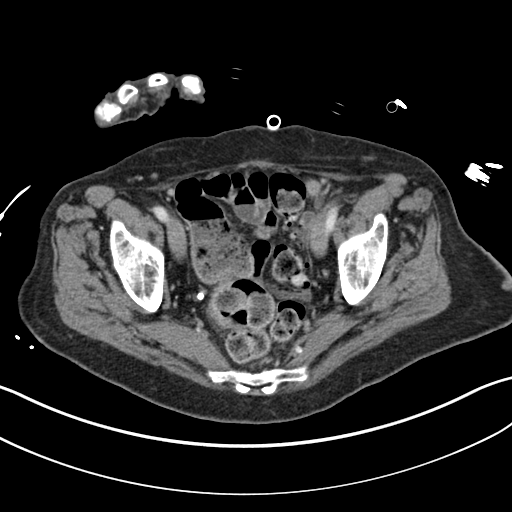
[im 28/84  soft-tissue]
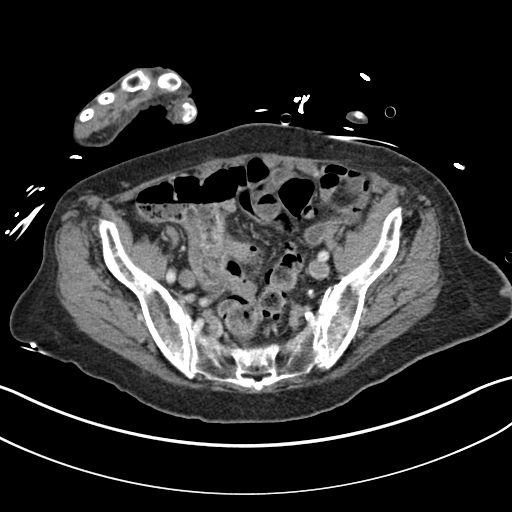
[im 34/84  soft-tissue]
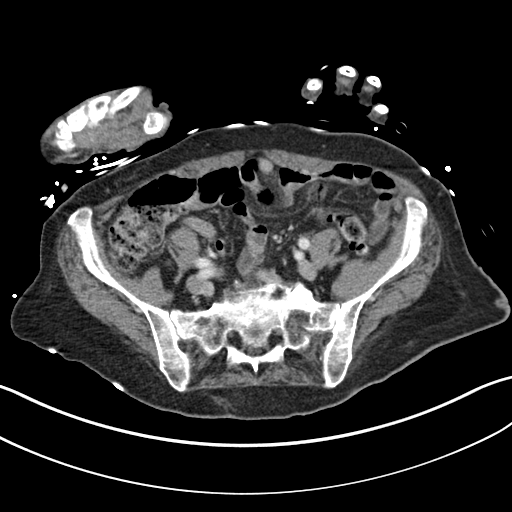
[im 45/84  soft-tissue]
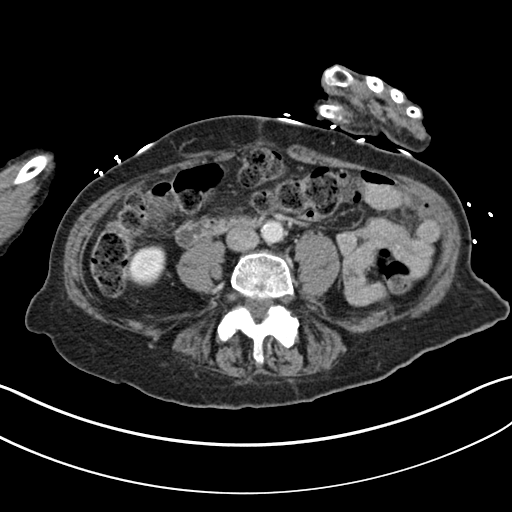
[im 50/84  soft-tissue]
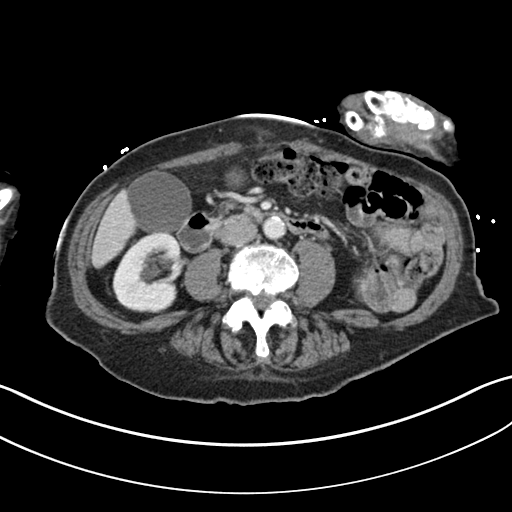
[im 56/84  soft-tissue]
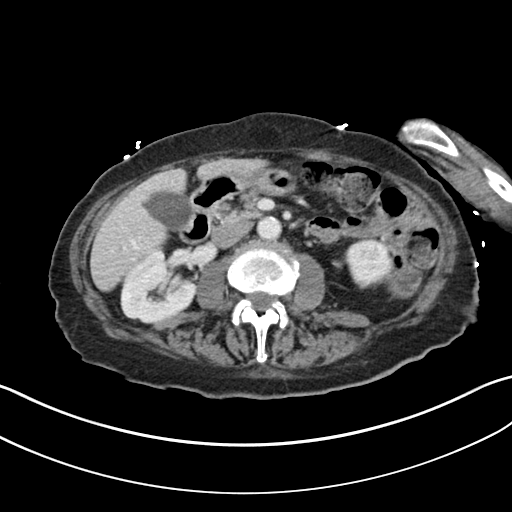
[im 56/84  bone]
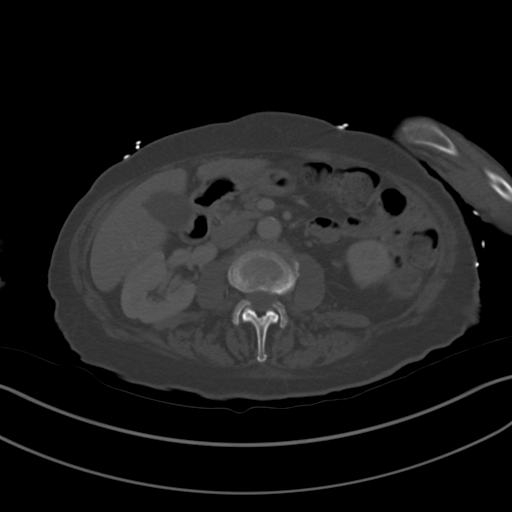
[im 61/84  soft-tissue]
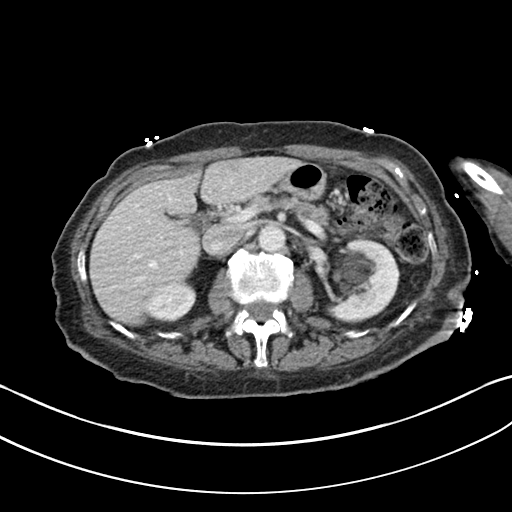
[im 67/84  soft-tissue]
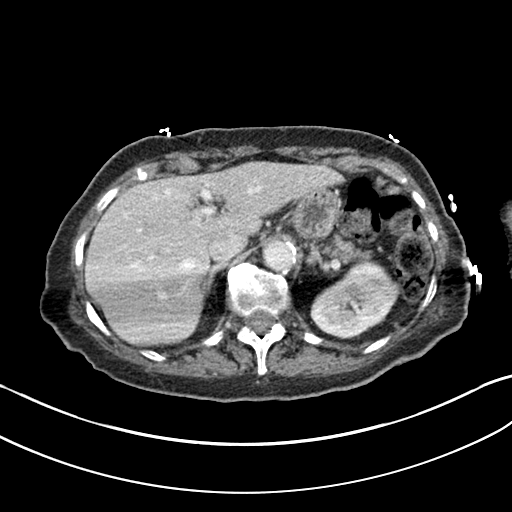
[im 72/84  soft-tissue]
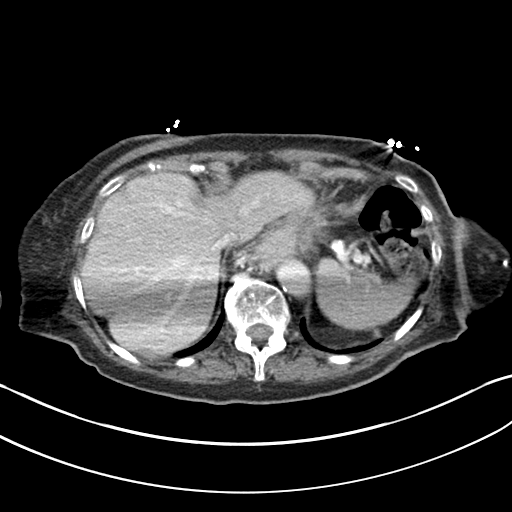
[im 78/84  soft-tissue]
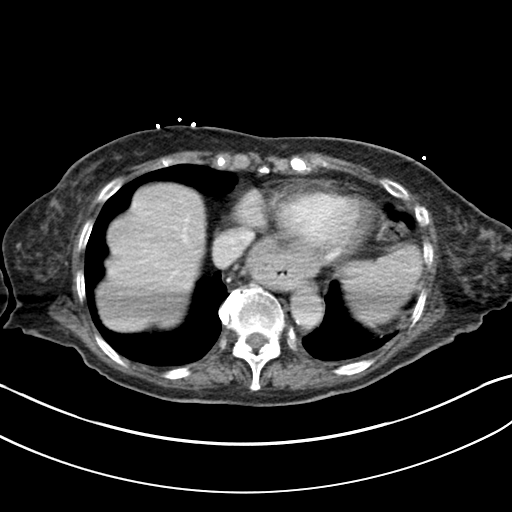

[Series 5: coronal st · coronal · 0.73mm/px · 3 of 121 slices shown]
[im 41/121  soft-tissue]
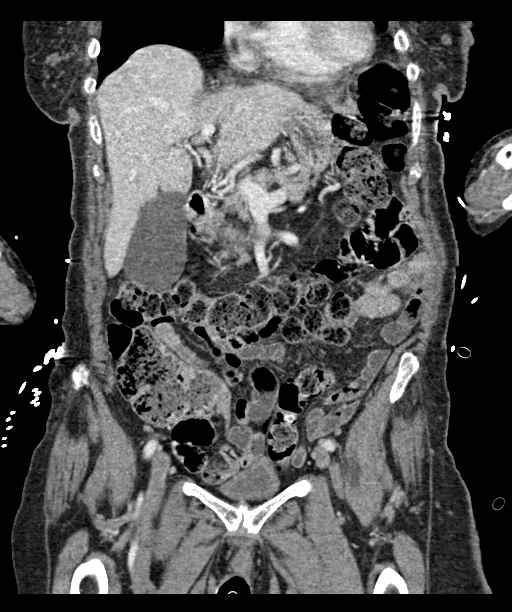
[im 54/121  soft-tissue]
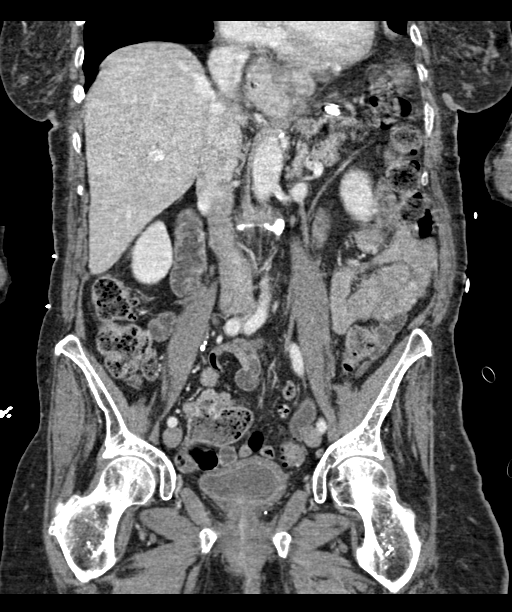
[im 67/121  soft-tissue]
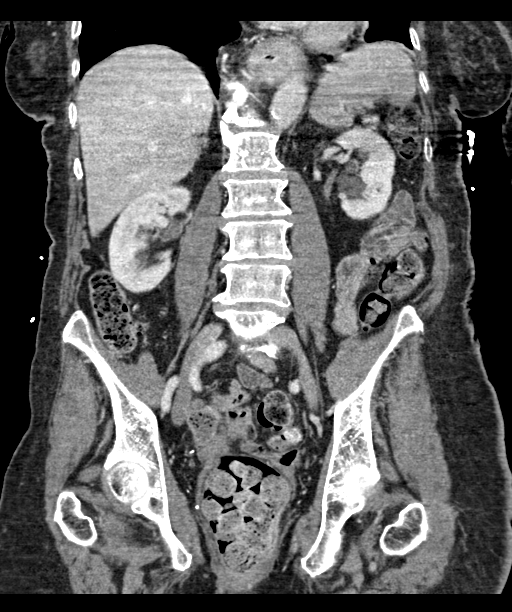

[16 of 46 positions shown; findings below may reference images not displayed]

FINDINGS: Lower chest: Lung bases are free of acute infiltrate or sizable
effusion. Large hiatal hernia is noted.

Hepatobiliary: No focal liver abnormality is seen. No gallstones,
gallbladder wall thickening, or biliary dilatation.

Pancreas: Unremarkable. No pancreatic ductal dilatation or
surrounding inflammatory changes.

Spleen: Normal in size without focal abnormality.

Adrenals/Urinary Tract: Adrenal glands are within normal limits.
Kidneys demonstrate a normal enhancement pattern. Peripelvic cysts
are noted bilaterally. No obstructive changes are seen. Ureters are
within normal limits. The bladder is decompressed.

Stomach/Bowel: Fecal material is noted within the rectum consistent
with mild impaction. Scattered fecal material in the colon is seen
consistent with a mild degree of constipation. No obstructive or
inflammatory changes of the colon are noted. The appendix is within
normal limits. No obstructive or inflammatory changes of the small
bowel are seen. Stomach again shows a large hiatal hernia.

Vascular/Lymphatic: Aortic atherosclerosis. No enlarged abdominal or
pelvic lymph nodes.

Reproductive: Status post hysterectomy. No adnexal masses.

Other: No abdominal wall hernia or abnormality. No abdominopelvic
ascites.

Musculoskeletal: Degenerative changes of lumbar spine are noted.
IMPRESSION: Large hiatal hernia.

No mass is identified.

Mild changes of constipation in the colon.
